# Patient Record
Sex: Male | Born: 1935 | Race: White | Hispanic: No | Marital: Married | Smoking: Former smoker
Health system: Southern US, Community
[De-identification: ages and names within clinical notes are randomized; demographics above are authoritative.]

## PROBLEM LIST (undated history)

## (undated) DIAGNOSIS — K219 Gastro-esophageal reflux disease without esophagitis: Secondary | ICD-10-CM

## (undated) DIAGNOSIS — C4490 Unspecified malignant neoplasm of skin, unspecified: Secondary | ICD-10-CM

## (undated) DIAGNOSIS — I255 Ischemic cardiomyopathy: Secondary | ICD-10-CM

## (undated) DIAGNOSIS — N138 Other obstructive and reflux uropathy: Secondary | ICD-10-CM

## (undated) DIAGNOSIS — N401 Enlarged prostate with lower urinary tract symptoms: Secondary | ICD-10-CM

## (undated) DIAGNOSIS — E7800 Pure hypercholesterolemia, unspecified: Secondary | ICD-10-CM

## (undated) DIAGNOSIS — I219 Acute myocardial infarction, unspecified: Secondary | ICD-10-CM

## (undated) DIAGNOSIS — I4891 Unspecified atrial fibrillation: Secondary | ICD-10-CM

## (undated) DIAGNOSIS — M1990 Unspecified osteoarthritis, unspecified site: Secondary | ICD-10-CM

## (undated) DIAGNOSIS — Z95810 Presence of automatic (implantable) cardiac defibrillator: Secondary | ICD-10-CM

## (undated) DIAGNOSIS — C61 Malignant neoplasm of prostate: Secondary | ICD-10-CM

## (undated) DIAGNOSIS — I739 Peripheral vascular disease, unspecified: Secondary | ICD-10-CM

## (undated) DIAGNOSIS — E039 Hypothyroidism, unspecified: Secondary | ICD-10-CM

## (undated) DIAGNOSIS — R413 Other amnesia: Secondary | ICD-10-CM

## (undated) DIAGNOSIS — N184 Chronic kidney disease, stage 4 (severe): Secondary | ICD-10-CM

## (undated) DIAGNOSIS — Z79899 Other long term (current) drug therapy: Secondary | ICD-10-CM

## (undated) DIAGNOSIS — K559 Vascular disorder of intestine, unspecified: Secondary | ICD-10-CM

## (undated) DIAGNOSIS — I10 Essential (primary) hypertension: Secondary | ICD-10-CM

## (undated) DIAGNOSIS — F419 Anxiety disorder, unspecified: Secondary | ICD-10-CM

## (undated) DIAGNOSIS — I4892 Unspecified atrial flutter: Secondary | ICD-10-CM

## (undated) DIAGNOSIS — D649 Anemia, unspecified: Secondary | ICD-10-CM

## (undated) DIAGNOSIS — I2510 Atherosclerotic heart disease of native coronary artery without angina pectoris: Secondary | ICD-10-CM

## (undated) DIAGNOSIS — F068 Other specified mental disorders due to known physiological condition: Secondary | ICD-10-CM

## (undated) DIAGNOSIS — I509 Heart failure, unspecified: Secondary | ICD-10-CM

## (undated) DIAGNOSIS — I5022 Chronic systolic (congestive) heart failure: Secondary | ICD-10-CM

## (undated) HISTORY — DX: Unspecified atrial flutter: I48.92

## (undated) HISTORY — DX: Anemia, unspecified: D64.9

## (undated) HISTORY — DX: Essential (primary) hypertension: I10

## (undated) HISTORY — DX: Ischemic cardiomyopathy: I25.5

## (undated) HISTORY — DX: Benign prostatic hyperplasia with lower urinary tract symptoms: N40.1

## (undated) HISTORY — DX: Other amnesia: R41.3

## (undated) HISTORY — DX: Chronic systolic (congestive) heart failure: I50.22

## (undated) HISTORY — DX: Unspecified osteoarthritis, unspecified site: M19.90

## (undated) HISTORY — DX: Other long term (current) drug therapy: Z79.899

## (undated) HISTORY — PX: CARDIAC DEFIBRILLATOR PLACEMENT: SHX171

## (undated) HISTORY — DX: Peripheral vascular disease, unspecified: I73.9

## (undated) HISTORY — DX: Other obstructive and reflux uropathy: N13.8

## (undated) HISTORY — DX: Unspecified atrial fibrillation: I48.91

## (undated) HISTORY — DX: Atherosclerotic heart disease of native coronary artery without angina pectoris: I25.10

## (undated) HISTORY — PX: COLON SURGERY: SHX602

## (undated) HISTORY — PX: CARDIAC CATHETERIZATION: SHX172

## (undated) HISTORY — PX: SKIN CANCER EXCISION: SHX779

## (undated) HISTORY — DX: Pure hypercholesterolemia, unspecified: E78.00

## (undated) HISTORY — PX: DOPPLER ECHOCARDIOGRAPHY: SHX263

## (undated) HISTORY — DX: Hypothyroidism, unspecified: E03.9

## (undated) HISTORY — PX: INCISION AND DRAINAGE: SHX5863

## (undated) HISTORY — DX: Vascular disorder of intestine, unspecified: K55.9

## (undated) HISTORY — DX: Other specified mental disorders due to known physiological condition: F06.8

## (undated) HISTORY — DX: Anxiety disorder, unspecified: F41.9

---

## 1992-02-06 ENCOUNTER — Encounter: Payer: Self-pay | Admitting: Gastroenterology

## 1995-04-12 ENCOUNTER — Encounter: Payer: Self-pay | Admitting: Gastroenterology

## 1996-08-07 DIAGNOSIS — I219 Acute myocardial infarction, unspecified: Secondary | ICD-10-CM

## 1996-08-07 HISTORY — PX: PERMANENT ILEOSTOMY: SHX6021

## 1996-08-07 HISTORY — DX: Acute myocardial infarction, unspecified: I21.9

## 1996-08-07 HISTORY — PX: TOTAL COLECTOMY: SHX852

## 1996-08-07 HISTORY — PX: CORONARY ARTERY BYPASS GRAFT: SHX141

## 1997-09-07 ENCOUNTER — Encounter: Payer: Self-pay | Admitting: Internal Medicine

## 1999-04-08 HISTORY — PX: CATARACT EXTRACTION W/ INTRAOCULAR LENS  IMPLANT, BILATERAL: SHX1307

## 1999-10-26 ENCOUNTER — Encounter: Payer: Self-pay | Admitting: *Deleted

## 1999-10-26 ENCOUNTER — Inpatient Hospital Stay (HOSPITAL_COMMUNITY): Admission: EM | Admit: 1999-10-26 | Discharge: 1999-11-01 | Payer: Self-pay | Admitting: *Deleted

## 1999-10-27 ENCOUNTER — Encounter: Payer: Self-pay | Admitting: *Deleted

## 1999-10-28 ENCOUNTER — Encounter: Payer: Self-pay | Admitting: Nephrology

## 2000-07-06 ENCOUNTER — Emergency Department (HOSPITAL_COMMUNITY): Admission: EM | Admit: 2000-07-06 | Discharge: 2000-07-06 | Payer: Self-pay | Admitting: Emergency Medicine

## 2000-07-10 ENCOUNTER — Encounter: Payer: Self-pay | Admitting: Emergency Medicine

## 2000-07-10 ENCOUNTER — Emergency Department (HOSPITAL_COMMUNITY): Admission: EM | Admit: 2000-07-10 | Discharge: 2000-07-10 | Payer: Self-pay | Admitting: Emergency Medicine

## 2001-05-21 ENCOUNTER — Other Ambulatory Visit: Admission: RE | Admit: 2001-05-21 | Discharge: 2001-05-21 | Payer: Self-pay | Admitting: Urology

## 2001-06-13 ENCOUNTER — Encounter: Payer: Self-pay | Admitting: Urology

## 2001-06-13 ENCOUNTER — Encounter: Admission: RE | Admit: 2001-06-13 | Discharge: 2001-06-13 | Payer: Self-pay | Admitting: Urology

## 2001-06-20 ENCOUNTER — Ambulatory Visit: Admission: RE | Admit: 2001-06-20 | Discharge: 2001-09-18 | Payer: Self-pay | Admitting: Radiation Oncology

## 2001-08-07 DIAGNOSIS — C61 Malignant neoplasm of prostate: Secondary | ICD-10-CM

## 2001-08-07 HISTORY — DX: Malignant neoplasm of prostate: C61

## 2001-09-04 ENCOUNTER — Emergency Department (HOSPITAL_COMMUNITY): Admission: EM | Admit: 2001-09-04 | Discharge: 2001-09-04 | Payer: Self-pay | Admitting: Emergency Medicine

## 2001-09-04 ENCOUNTER — Encounter: Payer: Self-pay | Admitting: Emergency Medicine

## 2001-09-19 ENCOUNTER — Ambulatory Visit: Admission: RE | Admit: 2001-09-19 | Discharge: 2001-12-18 | Payer: Self-pay | Admitting: Radiation Oncology

## 2001-11-07 ENCOUNTER — Emergency Department (HOSPITAL_COMMUNITY): Admission: EM | Admit: 2001-11-07 | Discharge: 2001-11-07 | Payer: Self-pay | Admitting: Emergency Medicine

## 2002-07-22 ENCOUNTER — Encounter: Payer: Self-pay | Admitting: Internal Medicine

## 2002-07-22 ENCOUNTER — Ambulatory Visit (HOSPITAL_COMMUNITY): Admission: RE | Admit: 2002-07-22 | Discharge: 2002-07-23 | Payer: Self-pay | Admitting: Internal Medicine

## 2002-07-23 ENCOUNTER — Encounter: Payer: Self-pay | Admitting: Internal Medicine

## 2003-12-22 ENCOUNTER — Ambulatory Visit (HOSPITAL_COMMUNITY): Admission: RE | Admit: 2003-12-22 | Discharge: 2003-12-22 | Payer: Self-pay | Admitting: Pulmonary Disease

## 2004-04-14 ENCOUNTER — Inpatient Hospital Stay (HOSPITAL_COMMUNITY): Admission: AD | Admit: 2004-04-14 | Discharge: 2004-04-18 | Payer: Self-pay | Admitting: Pulmonary Disease

## 2004-05-04 ENCOUNTER — Encounter: Admission: RE | Admit: 2004-05-04 | Discharge: 2004-05-04 | Payer: Self-pay | Admitting: Nephrology

## 2004-06-16 ENCOUNTER — Ambulatory Visit: Payer: Self-pay | Admitting: Pulmonary Disease

## 2004-09-14 ENCOUNTER — Ambulatory Visit: Payer: Self-pay | Admitting: Pulmonary Disease

## 2005-01-03 ENCOUNTER — Ambulatory Visit: Payer: Self-pay | Admitting: Internal Medicine

## 2005-01-03 ENCOUNTER — Ambulatory Visit: Payer: Self-pay | Admitting: *Deleted

## 2005-01-12 ENCOUNTER — Ambulatory Visit: Payer: Self-pay | Admitting: Pulmonary Disease

## 2005-04-18 ENCOUNTER — Ambulatory Visit: Payer: Self-pay | Admitting: Internal Medicine

## 2005-06-22 ENCOUNTER — Ambulatory Visit: Payer: Self-pay | Admitting: *Deleted

## 2005-07-13 ENCOUNTER — Encounter: Payer: Self-pay | Admitting: Internal Medicine

## 2005-07-13 ENCOUNTER — Ambulatory Visit: Payer: Self-pay

## 2005-07-13 ENCOUNTER — Ambulatory Visit: Payer: Self-pay | Admitting: Pulmonary Disease

## 2005-08-10 ENCOUNTER — Ambulatory Visit: Payer: Self-pay

## 2005-09-08 ENCOUNTER — Ambulatory Visit: Payer: Self-pay | Admitting: Pulmonary Disease

## 2005-09-08 ENCOUNTER — Ambulatory Visit: Payer: Self-pay | Admitting: Infectious Diseases

## 2005-09-08 ENCOUNTER — Inpatient Hospital Stay (HOSPITAL_COMMUNITY): Admission: EM | Admit: 2005-09-08 | Discharge: 2005-09-14 | Payer: Self-pay | Admitting: Pulmonary Disease

## 2005-09-19 ENCOUNTER — Ambulatory Visit: Payer: Self-pay | Admitting: Pulmonary Disease

## 2005-10-16 ENCOUNTER — Ambulatory Visit: Payer: Self-pay | Admitting: Pulmonary Disease

## 2005-10-19 ENCOUNTER — Ambulatory Visit: Payer: Self-pay | Admitting: *Deleted

## 2005-10-28 ENCOUNTER — Inpatient Hospital Stay (HOSPITAL_COMMUNITY): Admission: EM | Admit: 2005-10-28 | Discharge: 2005-11-05 | Payer: Self-pay | Admitting: Emergency Medicine

## 2005-10-28 ENCOUNTER — Ambulatory Visit: Payer: Self-pay | Admitting: Critical Care Medicine

## 2005-10-28 ENCOUNTER — Ambulatory Visit: Payer: Self-pay | Admitting: Cardiology

## 2005-10-30 ENCOUNTER — Encounter: Payer: Self-pay | Admitting: Cardiology

## 2005-11-21 ENCOUNTER — Ambulatory Visit: Payer: Self-pay | Admitting: Internal Medicine

## 2006-01-17 ENCOUNTER — Ambulatory Visit: Payer: Self-pay | Admitting: *Deleted

## 2006-02-22 ENCOUNTER — Ambulatory Visit: Payer: Self-pay | Admitting: Internal Medicine

## 2006-03-20 ENCOUNTER — Ambulatory Visit: Payer: Self-pay | Admitting: Pulmonary Disease

## 2006-03-20 ENCOUNTER — Ambulatory Visit: Payer: Self-pay | Admitting: *Deleted

## 2006-04-04 ENCOUNTER — Encounter: Payer: Self-pay | Admitting: Cardiology

## 2006-04-04 ENCOUNTER — Ambulatory Visit: Payer: Self-pay

## 2006-05-24 ENCOUNTER — Ambulatory Visit: Payer: Self-pay | Admitting: Internal Medicine

## 2006-07-03 ENCOUNTER — Ambulatory Visit: Payer: Self-pay | Admitting: *Deleted

## 2006-07-19 ENCOUNTER — Ambulatory Visit: Payer: Self-pay | Admitting: Pulmonary Disease

## 2006-08-23 ENCOUNTER — Ambulatory Visit: Payer: Self-pay | Admitting: Internal Medicine

## 2006-09-13 ENCOUNTER — Ambulatory Visit: Payer: Self-pay | Admitting: *Deleted

## 2006-09-13 ENCOUNTER — Ambulatory Visit: Payer: Self-pay

## 2006-09-13 LAB — CONVERTED CEMR LAB
BUN: 34 mg/dL — ABNORMAL HIGH (ref 6–23)
CO2: 29 meq/L (ref 19–32)
Calcium: 8.8 mg/dL (ref 8.4–10.5)
Chloride: 107 meq/L (ref 96–112)
Creatinine, Ser: 1.9 mg/dL — ABNORMAL HIGH (ref 0.4–1.5)
GFR calc Af Amer: 44 mL/min
GFR calc non Af Amer: 37 mL/min
Glucose, Bld: 64 mg/dL — ABNORMAL LOW (ref 70–99)
Potassium: 4.2 meq/L (ref 3.5–5.1)
Sodium: 139 meq/L (ref 135–145)

## 2006-10-17 ENCOUNTER — Ambulatory Visit: Payer: Self-pay | Admitting: Cardiology

## 2006-11-27 ENCOUNTER — Ambulatory Visit: Payer: Self-pay | Admitting: *Deleted

## 2006-11-27 ENCOUNTER — Ambulatory Visit: Payer: Self-pay | Admitting: Cardiology

## 2006-11-27 ENCOUNTER — Ambulatory Visit: Payer: Self-pay | Admitting: Pulmonary Disease

## 2006-11-27 LAB — CONVERTED CEMR LAB
BUN: 46 mg/dL — ABNORMAL HIGH (ref 6–23)
Calcium: 8.9 mg/dL (ref 8.4–10.5)
GFR calc Af Amer: 35 mL/min
Potassium: 5.3 meq/L — ABNORMAL HIGH (ref 3.5–5.1)

## 2006-12-13 ENCOUNTER — Ambulatory Visit: Payer: Self-pay | Admitting: Internal Medicine

## 2007-02-25 ENCOUNTER — Ambulatory Visit: Payer: Self-pay | Admitting: Cardiology

## 2007-02-25 ENCOUNTER — Ambulatory Visit: Payer: Self-pay | Admitting: Internal Medicine

## 2007-03-12 ENCOUNTER — Ambulatory Visit: Payer: Self-pay | Admitting: Pulmonary Disease

## 2007-03-12 ENCOUNTER — Inpatient Hospital Stay (HOSPITAL_COMMUNITY): Admission: AD | Admit: 2007-03-12 | Discharge: 2007-03-13 | Payer: Self-pay | Admitting: Internal Medicine

## 2007-03-12 ENCOUNTER — Ambulatory Visit: Payer: Self-pay | Admitting: Cardiology

## 2007-05-15 ENCOUNTER — Ambulatory Visit: Payer: Self-pay | Admitting: Internal Medicine

## 2007-06-01 DIAGNOSIS — E039 Hypothyroidism, unspecified: Secondary | ICD-10-CM | POA: Insufficient documentation

## 2007-06-01 DIAGNOSIS — M159 Polyosteoarthritis, unspecified: Secondary | ICD-10-CM

## 2007-06-01 DIAGNOSIS — I10 Essential (primary) hypertension: Secondary | ICD-10-CM | POA: Insufficient documentation

## 2007-06-01 DIAGNOSIS — E7800 Pure hypercholesterolemia, unspecified: Secondary | ICD-10-CM

## 2007-06-01 DIAGNOSIS — I739 Peripheral vascular disease, unspecified: Secondary | ICD-10-CM

## 2007-06-04 ENCOUNTER — Ambulatory Visit: Payer: Self-pay | Admitting: Pulmonary Disease

## 2007-07-16 ENCOUNTER — Ambulatory Visit: Payer: Self-pay

## 2007-08-15 ENCOUNTER — Ambulatory Visit: Payer: Self-pay | Admitting: Internal Medicine

## 2007-10-03 ENCOUNTER — Ambulatory Visit: Payer: Self-pay | Admitting: Pulmonary Disease

## 2007-10-03 DIAGNOSIS — F068 Other specified mental disorders due to known physiological condition: Secondary | ICD-10-CM

## 2007-10-03 DIAGNOSIS — N401 Enlarged prostate with lower urinary tract symptoms: Secondary | ICD-10-CM

## 2007-10-03 DIAGNOSIS — N189 Chronic kidney disease, unspecified: Secondary | ICD-10-CM

## 2007-10-03 DIAGNOSIS — I2510 Atherosclerotic heart disease of native coronary artery without angina pectoris: Secondary | ICD-10-CM | POA: Insufficient documentation

## 2007-10-03 DIAGNOSIS — I4892 Unspecified atrial flutter: Secondary | ICD-10-CM

## 2007-10-03 DIAGNOSIS — F411 Generalized anxiety disorder: Secondary | ICD-10-CM | POA: Insufficient documentation

## 2007-10-03 DIAGNOSIS — I2589 Other forms of chronic ischemic heart disease: Secondary | ICD-10-CM

## 2007-10-03 DIAGNOSIS — K559 Vascular disorder of intestine, unspecified: Secondary | ICD-10-CM | POA: Insufficient documentation

## 2007-10-03 LAB — CONVERTED CEMR LAB
Albumin: 3.7 g/dL (ref 3.5–5.2)
Basophils Absolute: 0.1 10*3/uL (ref 0.0–0.1)
Cholesterol: 189 mg/dL (ref 0–200)
Creatinine, Ser: 2 mg/dL — ABNORMAL HIGH (ref 0.4–1.5)
Direct LDL: 104.1 mg/dL
Eosinophils Absolute: 0.2 10*3/uL (ref 0.0–0.6)
GFR calc Af Amer: 42 mL/min
HCT: 44.4 % (ref 39.0–52.0)
HDL: 52.6 mg/dL (ref >39.0)
Hemoglobin: 14.8 g/dL (ref 13.0–17.0)
Lymphocytes Relative: 29.2 % (ref 12.0–46.0)
MCHC: 33.4 g/dL (ref 30.0–36.0)
MCV: 99.2 fL (ref 78.0–100.0)
Monocytes Absolute: 0.8 10*3/uL — ABNORMAL HIGH (ref 0.2–0.7)
Neutro Abs: 4.6 10*3/uL (ref 1.4–7.7)
Neutrophils Relative %: 56.9 % (ref 43.0–77.0)
PSA: 1.87 ng/mL (ref 0.10–4.00)
Potassium: 4.4 meq/L (ref 3.5–5.1)
RDW: 13.7 % (ref 11.5–14.6)
Sodium: 140 meq/L (ref 135–145)
Total Bilirubin: 0.9 mg/dL (ref 0.3–1.2)

## 2007-10-14 ENCOUNTER — Encounter: Payer: Self-pay | Admitting: Pulmonary Disease

## 2007-11-14 ENCOUNTER — Ambulatory Visit: Payer: Self-pay | Admitting: Internal Medicine

## 2008-01-21 ENCOUNTER — Ambulatory Visit: Payer: Self-pay | Admitting: Pulmonary Disease

## 2008-01-21 DIAGNOSIS — R05 Cough: Secondary | ICD-10-CM

## 2008-01-21 DIAGNOSIS — R059 Cough, unspecified: Secondary | ICD-10-CM | POA: Insufficient documentation

## 2008-02-13 ENCOUNTER — Ambulatory Visit: Payer: Self-pay | Admitting: Cardiology

## 2008-02-13 ENCOUNTER — Ambulatory Visit: Payer: Self-pay | Admitting: Internal Medicine

## 2008-02-24 ENCOUNTER — Encounter: Payer: Self-pay | Admitting: Pulmonary Disease

## 2008-03-04 ENCOUNTER — Encounter: Payer: Self-pay | Admitting: Pulmonary Disease

## 2008-05-21 ENCOUNTER — Ambulatory Visit: Payer: Self-pay | Admitting: Internal Medicine

## 2008-06-15 ENCOUNTER — Encounter: Payer: Self-pay | Admitting: Pulmonary Disease

## 2008-06-29 IMAGING — CR DG RIBS W/ CHEST 3+V*L*
3 series · 3 of 3 positions shown · non-contrast
Comparison: none

CLINICAL DATA: Left-sided rib pain.
 CHEST AND LEFT RIBS ? 3 VIEW:

[w chest pa]
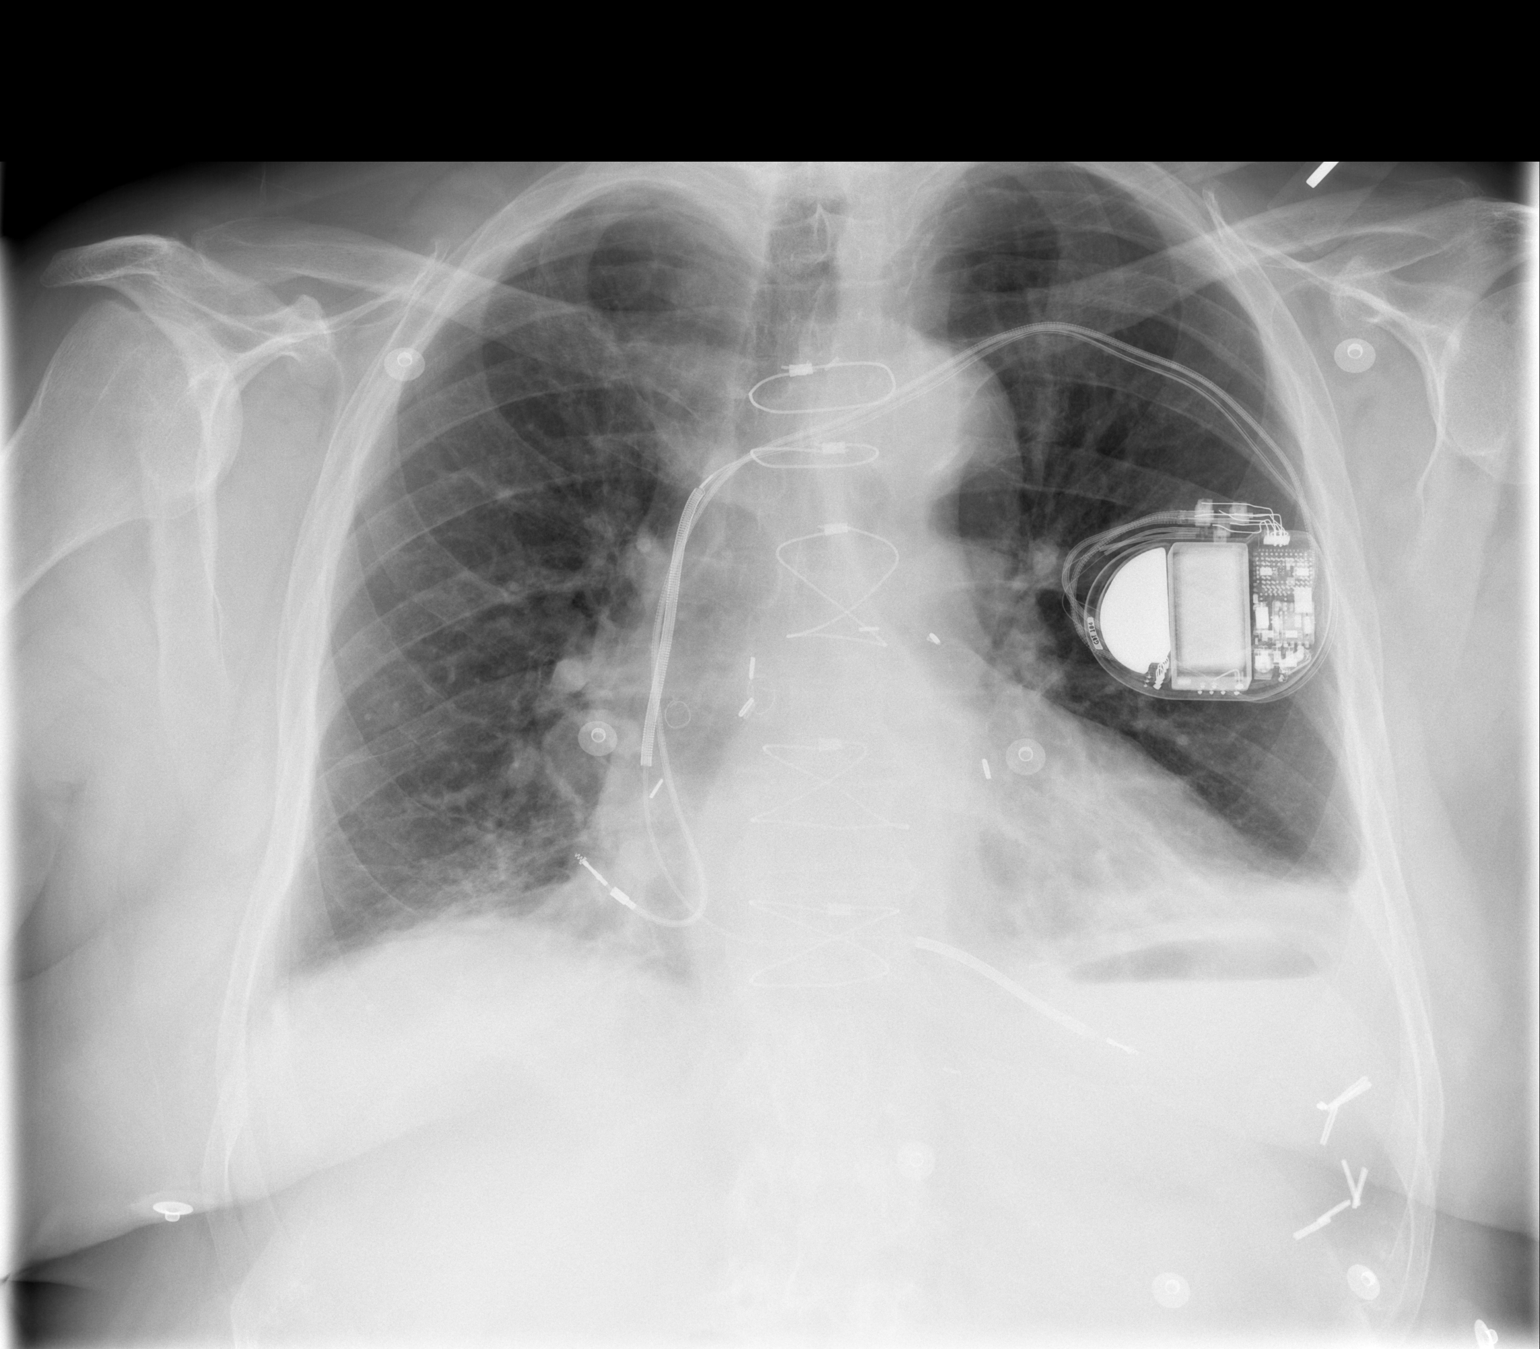

[w ribs ap/pa upper left *]
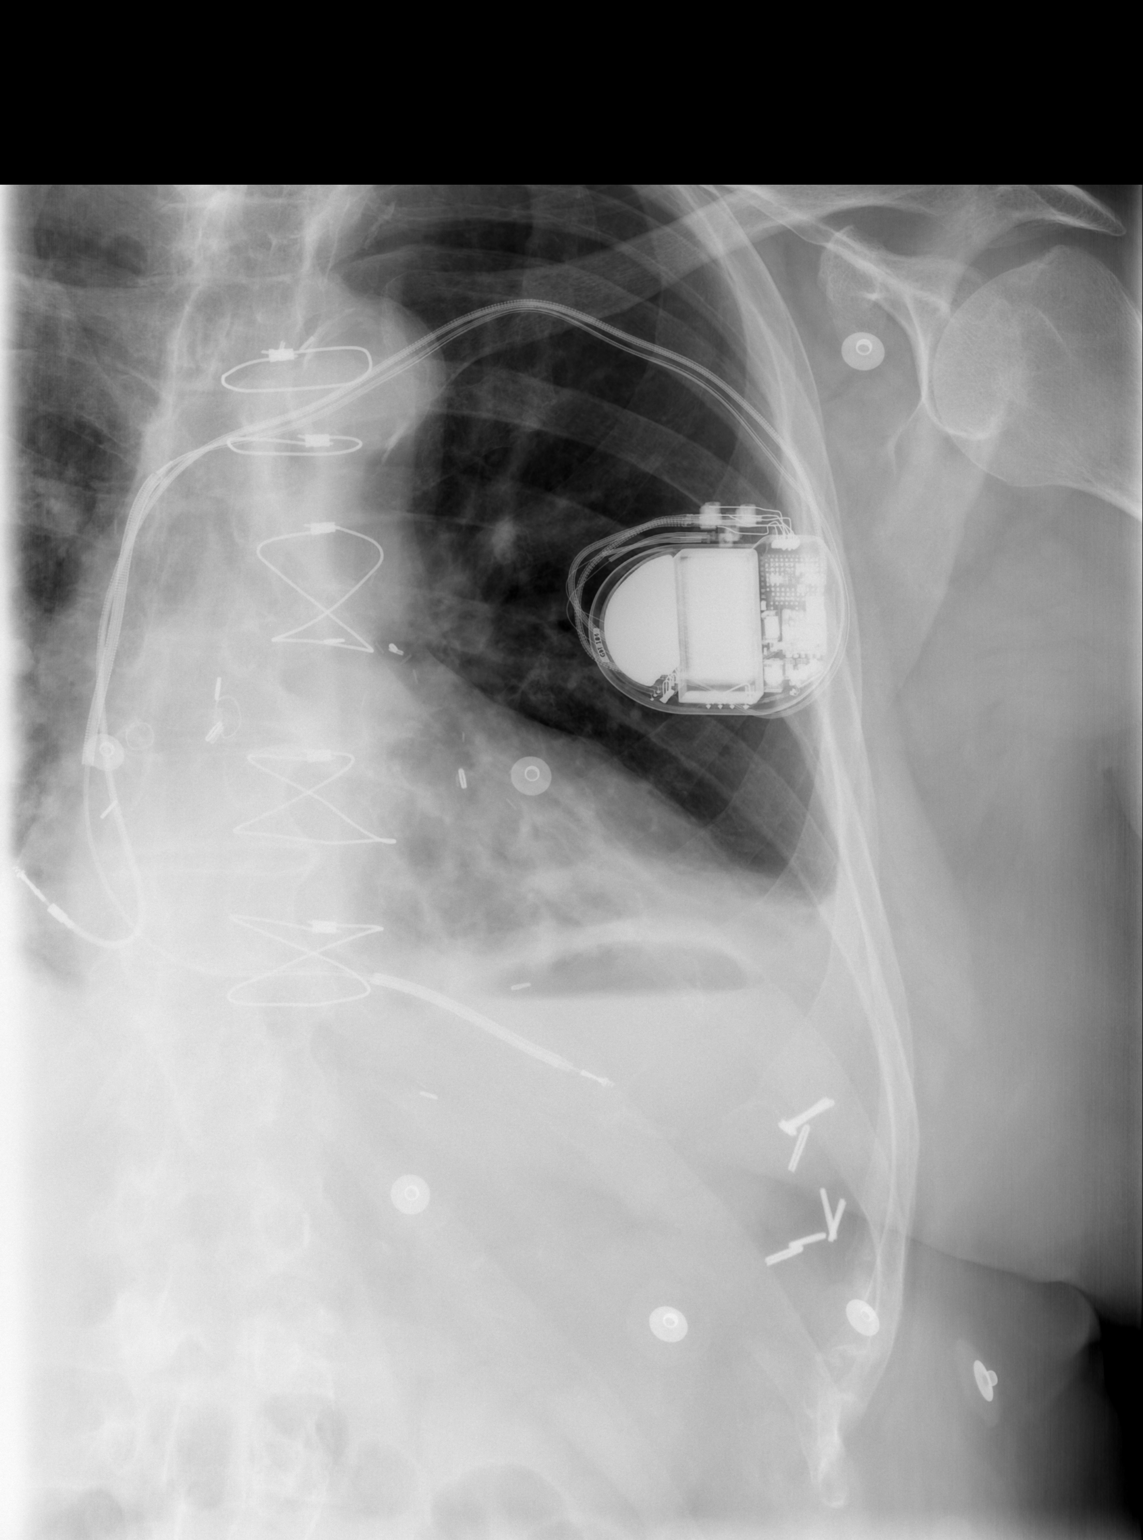

[w ribs ap/pa lower left *]
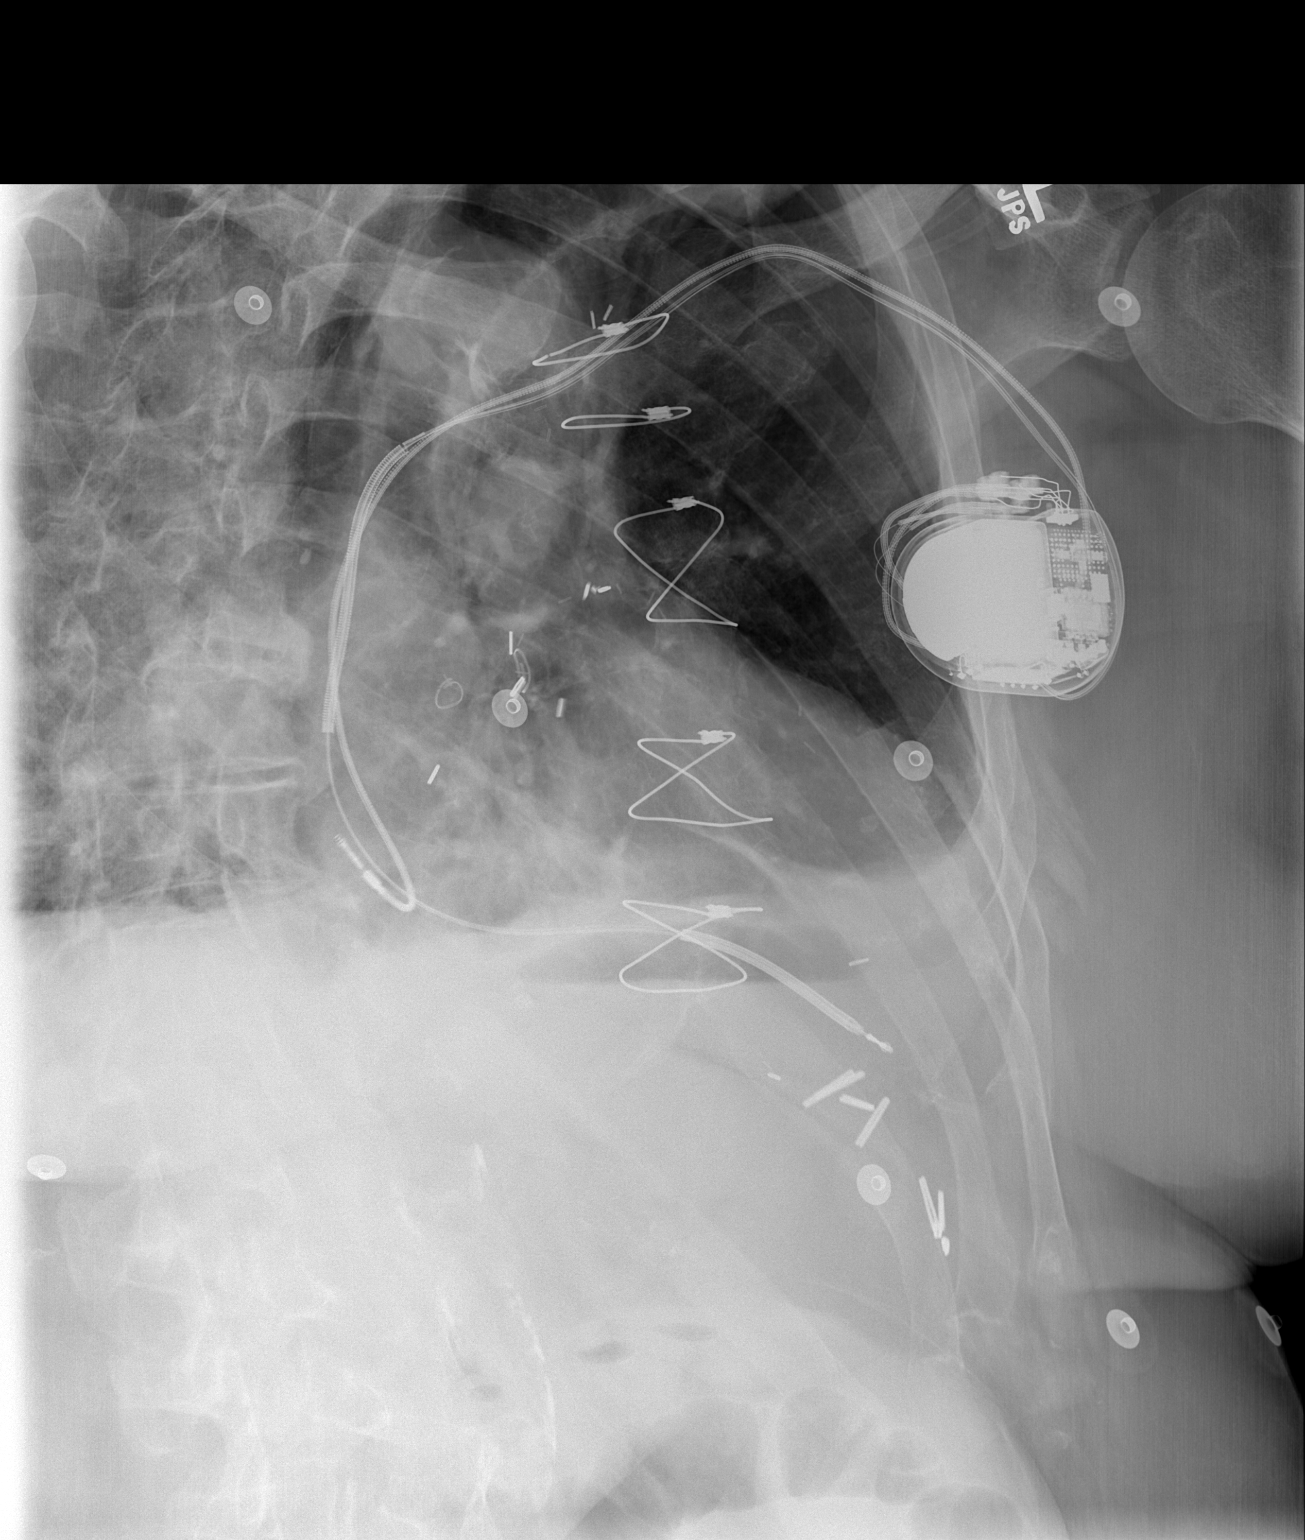

[3 of 3 positions shown; findings below may reference images not displayed]

FINDINGS: The patient has an AICD which is grossly well positioned.  The heart is enlarged.  There is abnormal density in both lower lungs.  This could be scarring or atelectasis.  The upper lobes are clear.  Left-sided rib films do not show any fractures or focal lesions.
IMPRESSION: 1.  Negative evaluation of the ribs. 
 2.  Cardiomegaly. 
 3.  Bibasilar lung density that could be scarring or atelectasis.

## 2008-07-16 ENCOUNTER — Encounter: Payer: Self-pay | Admitting: Pulmonary Disease

## 2008-07-23 ENCOUNTER — Ambulatory Visit: Payer: Self-pay | Admitting: Pulmonary Disease

## 2008-08-13 ENCOUNTER — Ambulatory Visit: Payer: Self-pay | Admitting: Internal Medicine

## 2008-09-18 ENCOUNTER — Telehealth: Payer: Self-pay | Admitting: Pulmonary Disease

## 2008-09-18 DIAGNOSIS — R498 Other voice and resonance disorders: Secondary | ICD-10-CM | POA: Insufficient documentation

## 2008-10-07 DIAGNOSIS — D649 Anemia, unspecified: Secondary | ICD-10-CM

## 2008-10-08 ENCOUNTER — Ambulatory Visit: Payer: Self-pay | Admitting: Gastroenterology

## 2008-10-08 ENCOUNTER — Telehealth (INDEPENDENT_AMBULATORY_CARE_PROVIDER_SITE_OTHER): Payer: Self-pay | Admitting: *Deleted

## 2008-10-08 DIAGNOSIS — R6889 Other general symptoms and signs: Secondary | ICD-10-CM

## 2008-10-09 ENCOUNTER — Encounter: Payer: Self-pay | Admitting: Pulmonary Disease

## 2008-10-22 ENCOUNTER — Encounter: Payer: Self-pay | Admitting: Internal Medicine

## 2008-11-11 ENCOUNTER — Telehealth (INDEPENDENT_AMBULATORY_CARE_PROVIDER_SITE_OTHER): Payer: Self-pay | Admitting: *Deleted

## 2008-11-12 ENCOUNTER — Ambulatory Visit: Payer: Self-pay | Admitting: Internal Medicine

## 2008-11-16 ENCOUNTER — Encounter: Payer: Self-pay | Admitting: Pulmonary Disease

## 2008-12-02 ENCOUNTER — Encounter: Payer: Self-pay | Admitting: Internal Medicine

## 2008-12-07 LAB — CONVERTED CEMR LAB: INR: 1.8 — ABNORMAL HIGH (ref 0.0–1.5)

## 2008-12-17 ENCOUNTER — Encounter: Payer: Self-pay | Admitting: Internal Medicine

## 2008-12-29 ENCOUNTER — Encounter: Payer: Self-pay | Admitting: Internal Medicine

## 2008-12-30 LAB — CONVERTED CEMR LAB: INR: 1.8

## 2008-12-31 LAB — CONVERTED CEMR LAB: Prothrombin Time: 21.5 s — ABNORMAL HIGH (ref 11.6–15.2)

## 2009-01-01 ENCOUNTER — Encounter: Payer: Self-pay | Admitting: Internal Medicine

## 2009-01-05 ENCOUNTER — Encounter: Payer: Self-pay | Admitting: *Deleted

## 2009-01-13 ENCOUNTER — Encounter: Payer: Self-pay | Admitting: Internal Medicine

## 2009-01-15 ENCOUNTER — Encounter: Payer: Self-pay | Admitting: Cardiovascular Disease

## 2009-01-21 ENCOUNTER — Ambulatory Visit: Payer: Self-pay | Admitting: Pulmonary Disease

## 2009-01-21 DIAGNOSIS — C61 Malignant neoplasm of prostate: Secondary | ICD-10-CM

## 2009-01-25 LAB — CONVERTED CEMR LAB
ALT: 23 units/L (ref 0–53)
Basophils Relative: 1.5 % (ref 0.0–3.0)
Bilirubin, Direct: 0.2 mg/dL (ref 0.0–0.3)
Chloride: 100 meq/L (ref 96–112)
Cholesterol: 180 mg/dL (ref 0–200)
Eosinophils Relative: 3.6 % (ref 0.0–5.0)
HCT: 42.2 % (ref 39.0–52.0)
Hemoglobin: 14.5 g/dL (ref 13.0–17.0)
Iron: 75 ug/dL (ref 42–165)
LDL Cholesterol: 92 mg/dL (ref 0–99)
Lymphs Abs: 2.4 10*3/uL (ref 0.7–4.0)
MCV: 97.3 fL (ref 78.0–100.0)
Monocytes Absolute: 0.8 10*3/uL (ref 0.1–1.0)
Potassium: 3.7 meq/L (ref 3.5–5.1)
RBC: 4.34 M/uL (ref 4.22–5.81)
TSH: 1.18 microintl units/mL (ref 0.35–5.50)
Total CHOL/HDL Ratio: 3
Total Protein: 8.1 g/dL (ref 6.0–8.3)
Vit D, 25-Hydroxy: 24 ng/mL — ABNORMAL LOW (ref 30–89)
Vitamin B-12: 489 pg/mL (ref 211–911)
WBC: 6.2 10*3/uL (ref 4.5–10.5)

## 2009-01-26 ENCOUNTER — Encounter: Payer: Self-pay | Admitting: Internal Medicine

## 2009-01-27 ENCOUNTER — Encounter: Payer: Self-pay | Admitting: Internal Medicine

## 2009-01-27 LAB — CONVERTED CEMR LAB
INR: 3.9 — ABNORMAL HIGH (ref 0.0–1.5)
Prothrombin Time: 41.4 s — ABNORMAL HIGH (ref 11.6–15.2)

## 2009-02-09 ENCOUNTER — Encounter: Payer: Self-pay | Admitting: Internal Medicine

## 2009-02-10 ENCOUNTER — Encounter: Payer: Self-pay | Admitting: Internal Medicine

## 2009-02-10 ENCOUNTER — Encounter: Payer: Self-pay | Admitting: *Deleted

## 2009-02-10 LAB — CONVERTED CEMR LAB: POC INR: 3.2

## 2009-02-16 ENCOUNTER — Ambulatory Visit: Payer: Self-pay | Admitting: Internal Medicine

## 2009-02-16 DIAGNOSIS — I4891 Unspecified atrial fibrillation: Secondary | ICD-10-CM

## 2009-02-16 DIAGNOSIS — Z95810 Presence of automatic (implantable) cardiac defibrillator: Secondary | ICD-10-CM | POA: Insufficient documentation

## 2009-02-23 ENCOUNTER — Encounter (INDEPENDENT_AMBULATORY_CARE_PROVIDER_SITE_OTHER): Payer: Self-pay | Admitting: Cardiology

## 2009-02-23 ENCOUNTER — Encounter: Payer: Self-pay | Admitting: Internal Medicine

## 2009-02-23 LAB — CONVERTED CEMR LAB: INR: 1.9 — ABNORMAL HIGH (ref 0.0–1.5)

## 2009-02-24 ENCOUNTER — Encounter: Payer: Self-pay | Admitting: Pulmonary Disease

## 2009-02-25 ENCOUNTER — Encounter: Payer: Self-pay | Admitting: Cardiovascular Disease

## 2009-02-25 LAB — CONVERTED CEMR LAB
POC INR: 1.9
Prothrombin Time: 23 s

## 2009-03-10 ENCOUNTER — Encounter: Payer: Self-pay | Admitting: Internal Medicine

## 2009-03-12 ENCOUNTER — Encounter: Payer: Self-pay | Admitting: Cardiology

## 2009-03-12 LAB — CONVERTED CEMR LAB
POC INR: 1.8
Prothrombin Time: 21.9 s

## 2009-03-24 ENCOUNTER — Encounter: Payer: Self-pay | Admitting: Internal Medicine

## 2009-03-24 ENCOUNTER — Encounter: Payer: Self-pay | Admitting: Cardiology

## 2009-03-24 LAB — CONVERTED CEMR LAB: INR: 1.9 — ABNORMAL HIGH (ref 0.0–1.5)

## 2009-03-25 ENCOUNTER — Encounter: Payer: Self-pay | Admitting: Cardiology

## 2009-03-25 LAB — CONVERTED CEMR LAB
POC INR: 1.9
Prothrombin Time: 21.8 s

## 2009-04-02 ENCOUNTER — Telehealth: Payer: Self-pay | Admitting: Internal Medicine

## 2009-04-08 ENCOUNTER — Encounter: Payer: Self-pay | Admitting: Internal Medicine

## 2009-04-08 LAB — CONVERTED CEMR LAB: INR: 2.6 — ABNORMAL HIGH (ref 0.0–1.5)

## 2009-04-09 ENCOUNTER — Encounter: Payer: Self-pay | Admitting: Internal Medicine

## 2009-04-13 ENCOUNTER — Encounter: Payer: Self-pay | Admitting: Pulmonary Disease

## 2009-04-19 ENCOUNTER — Encounter: Payer: Self-pay | Admitting: Pulmonary Disease

## 2009-04-22 ENCOUNTER — Encounter: Payer: Self-pay | Admitting: Pulmonary Disease

## 2009-04-22 ENCOUNTER — Encounter: Payer: Self-pay | Admitting: Cardiology

## 2009-04-22 LAB — CONVERTED CEMR LAB
POC INR: 2.3
Prothrombin Time: 25 s

## 2009-05-03 ENCOUNTER — Encounter: Payer: Self-pay | Admitting: Cardiology

## 2009-05-14 ENCOUNTER — Encounter: Payer: Self-pay | Admitting: Pulmonary Disease

## 2009-05-17 ENCOUNTER — Encounter: Payer: Self-pay | Admitting: Internal Medicine

## 2009-05-17 LAB — CONVERTED CEMR LAB
POC INR: 2.2
Prothrombin Time: 24 s

## 2009-05-27 ENCOUNTER — Ambulatory Visit: Payer: Self-pay | Admitting: Internal Medicine

## 2009-06-03 ENCOUNTER — Encounter: Payer: Self-pay | Admitting: Internal Medicine

## 2009-06-06 LAB — CONVERTED CEMR LAB: INR: 2.2 — ABNORMAL HIGH (ref 0.0–1.5)

## 2009-06-15 ENCOUNTER — Encounter: Payer: Self-pay | Admitting: Cardiology

## 2009-06-15 ENCOUNTER — Encounter: Payer: Self-pay | Admitting: Internal Medicine

## 2009-06-15 LAB — CONVERTED CEMR LAB
BUN: 36 mg/dL — ABNORMAL HIGH (ref 6–23)
CO2: 28 meq/L (ref 19–32)
Calcium: 9.5 mg/dL (ref 8.4–10.5)
Creatinine, Ser: 2.03 mg/dL — ABNORMAL HIGH (ref 0.40–1.50)
Glucose, Bld: 90 mg/dL (ref 70–99)
POC INR: 2.79

## 2009-06-16 ENCOUNTER — Encounter: Payer: Self-pay | Admitting: Cardiology

## 2009-06-22 ENCOUNTER — Encounter: Payer: Self-pay | Admitting: Internal Medicine

## 2009-07-13 ENCOUNTER — Encounter: Payer: Self-pay | Admitting: Internal Medicine

## 2009-07-14 ENCOUNTER — Encounter: Payer: Self-pay | Admitting: Cardiology

## 2009-07-20 ENCOUNTER — Encounter: Payer: Self-pay | Admitting: Pulmonary Disease

## 2009-08-11 ENCOUNTER — Encounter: Payer: Self-pay | Admitting: Internal Medicine

## 2009-08-12 LAB — CONVERTED CEMR LAB
INR: 2.46 — ABNORMAL HIGH (ref <1.50)
Prothrombin Time: 26.5 s — ABNORMAL HIGH (ref 11.6–15.2)

## 2009-08-13 ENCOUNTER — Encounter: Payer: Self-pay | Admitting: Cardiology

## 2009-08-13 LAB — CONVERTED CEMR LAB
POC INR: 2.46
Prothrombin Time: 26.5 s

## 2009-09-07 ENCOUNTER — Encounter: Payer: Self-pay | Admitting: Internal Medicine

## 2009-09-13 LAB — CONVERTED CEMR LAB
BUN: 45 mg/dL — ABNORMAL HIGH (ref 6–23)
Calcium: 9.4 mg/dL (ref 8.4–10.5)
Chloride: 100 meq/L (ref 96–112)
Creatinine, Ser: 2.21 mg/dL — ABNORMAL HIGH (ref 0.40–1.50)
POC INR: 2.51
Prothrombin Time: 26.9 s
Prothrombin Time: 26.9 s — ABNORMAL HIGH (ref 11.6–15.2)

## 2009-09-16 ENCOUNTER — Encounter: Payer: Self-pay | Admitting: Internal Medicine

## 2009-09-28 ENCOUNTER — Ambulatory Visit: Payer: Self-pay | Admitting: Internal Medicine

## 2009-10-06 ENCOUNTER — Encounter: Payer: Self-pay | Admitting: Internal Medicine

## 2009-10-07 ENCOUNTER — Encounter: Payer: Self-pay | Admitting: Cardiology

## 2009-10-07 LAB — CONVERTED CEMR LAB: Prothrombin Time: 21.6 s

## 2009-10-14 ENCOUNTER — Ambulatory Visit: Payer: Self-pay | Admitting: Pulmonary Disease

## 2009-10-17 LAB — CONVERTED CEMR LAB
Albumin: 3.9 g/dL (ref 3.5–5.2)
Alkaline Phosphatase: 43 units/L (ref 39–117)
Calcium: 9.3 mg/dL (ref 8.4–10.5)
Eosinophils Relative: 3.3 % (ref 0.0–5.0)
GFR calc non Af Amer: 30.56 mL/min (ref >60)
Glucose, Bld: 92 mg/dL (ref 70–99)
HCT: 43.6 % (ref 39.0–52.0)
HDL: 61.3 mg/dL (ref >39.00)
Hemoglobin: 14.9 g/dL (ref 13.0–17.0)
LDL Cholesterol: 89 mg/dL (ref 0–99)
Lymphs Abs: 2 10*3/uL (ref 0.7–4.0)
Monocytes Relative: 10.1 % (ref 3.0–12.0)
Neutro Abs: 3.5 10*3/uL (ref 1.4–7.7)
RDW: 13.8 % (ref 11.5–14.6)
Sodium: 142 meq/L (ref 135–145)
VLDL: 39.8 mg/dL (ref 0.0–40.0)
WBC: 6.5 10*3/uL (ref 4.5–10.5)

## 2009-11-03 ENCOUNTER — Encounter: Payer: Self-pay | Admitting: Internal Medicine

## 2009-11-05 ENCOUNTER — Encounter: Payer: Self-pay | Admitting: Internal Medicine

## 2009-11-05 LAB — CONVERTED CEMR LAB
INR: 1.79 — ABNORMAL HIGH (ref <1.50)
POC INR: 1.79
Prothrombin Time: 20.6 s

## 2009-11-11 ENCOUNTER — Encounter: Payer: Self-pay | Admitting: Pulmonary Disease

## 2009-11-18 ENCOUNTER — Encounter: Payer: Self-pay | Admitting: Internal Medicine

## 2009-11-18 ENCOUNTER — Encounter: Payer: Self-pay | Admitting: Pulmonary Disease

## 2009-11-19 ENCOUNTER — Encounter: Payer: Self-pay | Admitting: Internal Medicine

## 2009-11-19 LAB — CONVERTED CEMR LAB
POC INR: 2.6
Prothrombin Time: 26.3 s

## 2009-11-22 ENCOUNTER — Telehealth (INDEPENDENT_AMBULATORY_CARE_PROVIDER_SITE_OTHER): Payer: Self-pay | Admitting: *Deleted

## 2009-11-24 ENCOUNTER — Telehealth: Payer: Self-pay | Admitting: Pulmonary Disease

## 2009-12-09 ENCOUNTER — Encounter: Payer: Self-pay | Admitting: Internal Medicine

## 2009-12-10 ENCOUNTER — Encounter: Payer: Self-pay | Admitting: Cardiology

## 2009-12-21 ENCOUNTER — Ambulatory Visit: Payer: Self-pay | Admitting: Internal Medicine

## 2009-12-21 DIAGNOSIS — I5022 Chronic systolic (congestive) heart failure: Secondary | ICD-10-CM

## 2009-12-21 LAB — CONVERTED CEMR LAB
BUN: 45 mg/dL — ABNORMAL HIGH (ref 6–23)
Chloride: 98 meq/L (ref 96–112)
Eosinophils Relative: 4.1 % (ref 0.0–5.0)
Glucose, Bld: 97 mg/dL (ref 70–99)
HCT: 43.7 % (ref 39.0–52.0)
Lymphs Abs: 1.8 10*3/uL (ref 0.7–4.0)
MCV: 98 fL (ref 78.0–100.0)
Monocytes Absolute: 1 10*3/uL (ref 0.1–1.0)
Platelets: 235 10*3/uL (ref 150.0–400.0)
Potassium: 5 meq/L (ref 3.5–5.1)
Prothrombin Time: 21.3 s — ABNORMAL HIGH (ref 9.1–11.7)
RDW: 14.9 % — ABNORMAL HIGH (ref 11.5–14.6)
WBC: 7.4 10*3/uL (ref 4.5–10.5)

## 2009-12-24 ENCOUNTER — Telehealth: Payer: Self-pay | Admitting: Internal Medicine

## 2009-12-28 ENCOUNTER — Ambulatory Visit: Payer: Self-pay | Admitting: Internal Medicine

## 2009-12-28 ENCOUNTER — Ambulatory Visit (HOSPITAL_COMMUNITY): Admission: RE | Admit: 2009-12-28 | Discharge: 2009-12-28 | Payer: Self-pay | Admitting: Internal Medicine

## 2009-12-29 ENCOUNTER — Encounter: Payer: Self-pay | Admitting: Internal Medicine

## 2009-12-29 ENCOUNTER — Telehealth: Payer: Self-pay | Admitting: Internal Medicine

## 2010-01-04 ENCOUNTER — Telehealth: Payer: Self-pay | Admitting: Internal Medicine

## 2010-01-05 ENCOUNTER — Telehealth (INDEPENDENT_AMBULATORY_CARE_PROVIDER_SITE_OTHER): Payer: Self-pay | Admitting: *Deleted

## 2010-01-06 ENCOUNTER — Encounter (INDEPENDENT_AMBULATORY_CARE_PROVIDER_SITE_OTHER): Payer: Self-pay | Admitting: Pharmacist

## 2010-01-06 LAB — CONVERTED CEMR LAB
CO2: 22 meq/L (ref 19–32)
Chloride: 97 meq/L (ref 96–112)
Creatinine, Ser: 3.56 mg/dL — ABNORMAL HIGH (ref 0.40–1.50)
INR: 2.73 — ABNORMAL HIGH (ref <1.50)
Potassium: 4.6 meq/L (ref 3.5–5.3)

## 2010-01-07 ENCOUNTER — Encounter: Payer: Self-pay | Admitting: Cardiovascular Disease

## 2010-01-13 ENCOUNTER — Ambulatory Visit: Payer: Self-pay

## 2010-02-08 ENCOUNTER — Encounter: Payer: Self-pay | Admitting: Internal Medicine

## 2010-02-08 ENCOUNTER — Encounter: Payer: Self-pay | Admitting: Pulmonary Disease

## 2010-02-08 LAB — CONVERTED CEMR LAB
POC INR: 1.8
Prothrombin Time: 18.6 s

## 2010-02-14 ENCOUNTER — Encounter: Payer: Self-pay | Admitting: Internal Medicine

## 2010-03-07 ENCOUNTER — Encounter: Payer: Self-pay | Admitting: Internal Medicine

## 2010-03-07 ENCOUNTER — Encounter: Payer: Self-pay | Admitting: Cardiovascular Disease

## 2010-03-07 LAB — CONVERTED CEMR LAB
POC INR: 2.06
Prothrombin Time: 23.4 s

## 2010-03-08 ENCOUNTER — Encounter: Payer: Self-pay | Admitting: Cardiovascular Disease

## 2010-03-08 ENCOUNTER — Telehealth (INDEPENDENT_AMBULATORY_CARE_PROVIDER_SITE_OTHER): Payer: Self-pay | Admitting: *Deleted

## 2010-03-09 LAB — CONVERTED CEMR LAB
CO2: 26 meq/L (ref 19–32)
Calcium: 9.1 mg/dL (ref 8.4–10.5)
INR: 2.06 — ABNORMAL HIGH (ref <1.50)
Prothrombin Time: 23.4 s — ABNORMAL HIGH (ref 11.6–15.2)
Sodium: 140 meq/L (ref 135–145)

## 2010-03-10 ENCOUNTER — Encounter: Payer: Self-pay | Admitting: Pulmonary Disease

## 2010-03-24 ENCOUNTER — Encounter: Payer: Self-pay | Admitting: Internal Medicine

## 2010-03-24 LAB — CONVERTED CEMR LAB
CO2: 27 meq/L (ref 19–32)
Chloride: 98 meq/L (ref 96–112)
Glucose, Bld: 97 mg/dL (ref 70–99)
Potassium: 5.5 meq/L — ABNORMAL HIGH (ref 3.5–5.3)
Prothrombin Time: 24 s — ABNORMAL HIGH (ref 11.6–15.2)
Sodium: 140 meq/L (ref 135–145)

## 2010-03-25 ENCOUNTER — Telehealth: Payer: Self-pay | Admitting: Internal Medicine

## 2010-03-28 ENCOUNTER — Encounter: Payer: Self-pay | Admitting: Internal Medicine

## 2010-03-28 ENCOUNTER — Encounter: Payer: Self-pay | Admitting: Cardiology

## 2010-03-28 LAB — CONVERTED CEMR LAB
BUN: 44 mg/dL — ABNORMAL HIGH (ref 6–23)
CO2: 24 meq/L (ref 19–32)
Chloride: 97 meq/L (ref 96–112)
Glucose, Bld: 183 mg/dL — ABNORMAL HIGH (ref 70–99)
Potassium: 4 meq/L (ref 3.5–5.3)

## 2010-03-29 ENCOUNTER — Encounter: Payer: Self-pay | Admitting: Cardiology

## 2010-04-06 ENCOUNTER — Encounter: Payer: Self-pay | Admitting: Pulmonary Disease

## 2010-04-12 ENCOUNTER — Ambulatory Visit: Payer: Self-pay | Admitting: Internal Medicine

## 2010-04-13 ENCOUNTER — Encounter: Payer: Self-pay | Admitting: Pulmonary Disease

## 2010-04-14 ENCOUNTER — Ambulatory Visit: Payer: Self-pay | Admitting: Pulmonary Disease

## 2010-04-18 ENCOUNTER — Encounter: Payer: Self-pay | Admitting: Internal Medicine

## 2010-04-18 LAB — CONVERTED CEMR LAB
POC INR: 2.19
Prothrombin Time: 24.5 s — ABNORMAL HIGH (ref 11.6–15.2)

## 2010-05-13 ENCOUNTER — Encounter: Payer: Self-pay | Admitting: Pulmonary Disease

## 2010-05-17 ENCOUNTER — Encounter: Payer: Self-pay | Admitting: Internal Medicine

## 2010-05-18 ENCOUNTER — Encounter: Payer: Self-pay | Admitting: Cardiology

## 2010-05-18 LAB — CONVERTED CEMR LAB
INR: 1.97 — ABNORMAL HIGH (ref <1.50)
POC INR: 1.97
Prothrombin Time: 22.6 s — ABNORMAL HIGH (ref 11.6–15.2)

## 2010-05-24 ENCOUNTER — Telehealth (INDEPENDENT_AMBULATORY_CARE_PROVIDER_SITE_OTHER): Payer: Self-pay | Admitting: *Deleted

## 2010-05-30 ENCOUNTER — Encounter: Payer: Self-pay | Admitting: Pulmonary Disease

## 2010-05-30 ENCOUNTER — Encounter: Payer: Self-pay | Admitting: Internal Medicine

## 2010-06-01 ENCOUNTER — Encounter: Payer: Self-pay | Admitting: Internal Medicine

## 2010-06-01 LAB — CONVERTED CEMR LAB: POC INR: 2.18

## 2010-06-03 ENCOUNTER — Encounter: Payer: Self-pay | Admitting: Internal Medicine

## 2010-06-22 ENCOUNTER — Encounter: Payer: Self-pay | Admitting: Internal Medicine

## 2010-06-23 ENCOUNTER — Encounter: Payer: Self-pay | Admitting: Cardiovascular Disease

## 2010-06-23 LAB — CONVERTED CEMR LAB
POC INR: 1.92
Prothrombin Time: 22.1 s

## 2010-06-24 ENCOUNTER — Encounter: Payer: Self-pay | Admitting: Pulmonary Disease

## 2010-07-06 ENCOUNTER — Encounter: Payer: Self-pay | Admitting: Internal Medicine

## 2010-07-06 LAB — CONVERTED CEMR LAB: POC INR: 1.78

## 2010-07-07 ENCOUNTER — Encounter: Payer: Self-pay | Admitting: Internal Medicine

## 2010-07-14 ENCOUNTER — Ambulatory Visit: Payer: Self-pay | Admitting: Internal Medicine

## 2010-07-14 ENCOUNTER — Ambulatory Visit (HOSPITAL_COMMUNITY)
Admission: RE | Admit: 2010-07-14 | Discharge: 2010-07-14 | Payer: Self-pay | Source: Home / Self Care | Attending: Urology | Admitting: Urology

## 2010-07-20 ENCOUNTER — Encounter: Payer: Self-pay | Admitting: Internal Medicine

## 2010-07-22 ENCOUNTER — Encounter: Payer: Self-pay | Admitting: Internal Medicine

## 2010-07-22 ENCOUNTER — Encounter: Payer: Self-pay | Admitting: Cardiology

## 2010-07-22 LAB — CONVERTED CEMR LAB
POC INR: 2.86
Prothrombin Time: 30.1 s

## 2010-07-25 ENCOUNTER — Encounter: Payer: Self-pay | Admitting: Cardiology

## 2010-07-26 LAB — CONVERTED CEMR LAB: Prothrombin Time: 30.1 s — ABNORMAL HIGH (ref 11.6–15.2)

## 2010-08-04 ENCOUNTER — Encounter: Payer: Self-pay | Admitting: Cardiology

## 2010-08-04 ENCOUNTER — Encounter: Payer: Self-pay | Admitting: Internal Medicine

## 2010-08-04 LAB — CONVERTED CEMR LAB
BUN: 62 mg/dL — ABNORMAL HIGH (ref 6–23)
Calcium: 9.4 mg/dL (ref 8.4–10.5)
Glucose, Bld: 93 mg/dL (ref 70–99)
POC INR: 3.56

## 2010-08-05 ENCOUNTER — Encounter: Payer: Self-pay | Admitting: Cardiology

## 2010-08-18 ENCOUNTER — Encounter: Payer: Self-pay | Admitting: Internal Medicine

## 2010-08-24 ENCOUNTER — Telehealth: Payer: Self-pay | Admitting: Internal Medicine

## 2010-08-30 ENCOUNTER — Ambulatory Visit
Admission: RE | Admit: 2010-08-30 | Discharge: 2010-08-30 | Payer: Self-pay | Source: Home / Self Care | Attending: Internal Medicine | Admitting: Internal Medicine

## 2010-08-30 ENCOUNTER — Other Ambulatory Visit: Payer: Self-pay | Admitting: Internal Medicine

## 2010-08-30 DIAGNOSIS — N184 Chronic kidney disease, stage 4 (severe): Secondary | ICD-10-CM | POA: Insufficient documentation

## 2010-08-30 LAB — RENAL FUNCTION PANEL
Albumin: 3.6 g/dL (ref 3.5–5.2)
CO2: 26 mEq/L (ref 19–32)
Calcium: 9.3 mg/dL (ref 8.4–10.5)
Glucose, Bld: 131 mg/dL — ABNORMAL HIGH (ref 70–99)
Potassium: 3.7 mEq/L (ref 3.5–5.1)

## 2010-09-02 ENCOUNTER — Other Ambulatory Visit: Payer: Self-pay | Admitting: Nephrology

## 2010-09-02 DIAGNOSIS — N184 Chronic kidney disease, stage 4 (severe): Secondary | ICD-10-CM

## 2010-09-04 LAB — CONVERTED CEMR LAB
BUN: 39 mg/dL — ABNORMAL HIGH (ref 6–23)
Chloride: 100 meq/L (ref 96–112)
Glucose, Bld: 179 mg/dL — ABNORMAL HIGH (ref 70–99)
INR: 2.2 — ABNORMAL HIGH (ref 0.0–1.5)
Potassium: 3.9 meq/L (ref 3.5–5.3)
Prothrombin Time: 26 s — ABNORMAL HIGH (ref 11.6–15.2)
Sodium: 137 meq/L (ref 135–145)

## 2010-09-08 ENCOUNTER — Other Ambulatory Visit: Payer: Self-pay

## 2010-09-08 NOTE — Letter (Signed)
[facility]   Imported By: Sherian Rein 07/11/2010 08:44:35  _____________________________________________________________________  External Attachment:    Type:   Image     Comment:   External Document

## 2010-09-08 NOTE — Medication Information (Signed)
Summary: rov/tm  Anticoagulant Therapy  Managed by: Windell Hummingbird, RN Referring MD: Sharrell Ku PCP: Alroy Dust, MD Supervising MD: Graciela Husbands MD, Viviann Spare Indication 1: Atrial Fibrillation Indication 2: Coronary Artery Disease Lab Used: [facility] [place] Site: [address] INR POC 2.9 INR RANGE 2.0-3.0  Dietary changes: no    Health status changes: no    Bleeding/hemorrhagic complications: no    Recent/future hospitalizations: no    Any changes in medication regimen? no    Recent/future dental: no  Any missed doses?: no       Is patient compliant with meds? yes       Allergies: 1)  ! Penicillin 2)  ! Amoxicillin 3)  ! * Ivp Dye 4)  ! * Clindamycin  Anticoagulation Management History:      Positive risk factors for bleeding include an age of 75 years or older and presence of serious comorbidities.  The bleeding index is 'intermediate risk'.  Positive CHADS2 values include History of CHF, History of HTN, and Age > 80 years old.  The start date was 09/13/2006.  His last INR was 3.43.  Anticoagulation responsible provider: Graciela Husbands MD, Viviann Spare.  INR POC: 2.9.  Cuvette Lot#: [id number].  Exp: 08/2011.    Anticoagulation Management Assessment/Plan:      The patient's current anticoagulation dose is Coumadin 5 mg tabs: Take as directed by coumadin clinic..  The target INR is 2.0-3.0.  The next INR is due 09/20/2010.  Anticoagulation instructions were given to spouse.  Results were reviewed/authorized by Windell Hummingbird, RN.  He was notified by Windell Hummingbird, RN.         Prior Anticoagulation Instructions: INR 3.43  Called spoke with pt's daughter advised to hols x 1 dose, then start taking 5mg  daily except 2.5mg  on Mondays.  Recheck in 2 weeks.    Current Anticoagulation Instructions: INR 2.9  Continue with 1 tablet every day except 1/2 tablet on Monday.

## 2010-09-08 NOTE — Medication Information (Signed)
Summary: [facility]  Anticoagulant Therapy  Managed by: [name], PharmD Referring MD: Sharrell Ku PCP: Alroy Dust, MD Supervising MD: Graciela Husbands MD, Viviann Spare Indication 1: Coronary Artery Disease (ICD-CAD) Lab Used: [facility] Site: [address] PT 26.3 INR POC 2.6 INR RANGE 2.0-3.0  Dietary changes: no    Health status changes: no    Bleeding/hemorrhagic complications: no    Recent/future hospitalizations: no    Any changes in medication regimen? yes       Details: increased furosemide to 2 pills in am and 1 pill in pm   Recent/future dental: no  Any missed doses?: no       Is patient compliant with meds? yes      Comments: Had lab work done at Dr. Deterding's office   Allergies: 1)  ! Penicillin 2)  ! Amoxicillin 3)  ! * Ivp Dye  Anticoagulation Management History:      Positive risk factors for bleeding include an age of 12 years or older and presence of serious comorbidities.  The bleeding index is 'intermediate risk'.  Positive CHADS2 values include History of HTN and Age > 12 years old.  The start date was 09/13/2006.  His last INR was 1.79.  Prothrombin time is 26.3.  Anticoagulation responsible provider: Graciela Husbands MD, Viviann Spare.  INR POC: 2.6.    Anticoagulation Management Assessment/Plan:      The patient's current anticoagulation dose is Coumadin 5 mg tabs: Take as directed by coumadin clinic..  The target INR is 2.0-3.0.  The next INR is due 12/09/2009.  Anticoagulation instructions were given to spouse.  Results were reviewed/authorized by [name], PharmD.  He was notified by [name] PharmD.         Prior Anticoagulation Instructions: INR 1.79 Change dose to 7.5mg s today then change dose to 5mg s daily except 2.5mg s on Sundays and Thursdays. Recheck INR in 2 weeks. Spouse aware of dose and redraw date.   Current Anticoagulation Instructions: INR 2.6   Continue same dose of 5mg  every day except 1/2 tablet on Sunday and Thursday.  Recheck  [facility] at [facility] [employer].  Gave instructions to pt's wife.

## 2010-09-08 NOTE — Progress Notes (Signed)
Summary: wants a insentive spirometer  Phone Note Call from Patient Call back at Home Phone [phone]   Caller: [name] (wife) Call For: nadel Summary of Call: her daughter is a Engineer, civil (consulting) and she wanted to ask dr [name] if he would order a insenitive spirometer for [name].  [facility] [phone] Initial call taken by: Valinda Hoar,  November 22, 2009 12:26 PM  Follow-up for Phone Call        Please advise if this is okay to send order for incentive spirometer, thanks Vernie Murders  November 22, 2009 3:10 PM   order has been sent for the spirometer. Randell Loop [facility]  November 22, 2009 5:33 PM

## 2010-09-08 NOTE — Letter (Signed)
[facility]   Imported By: Marylou Mccoy 02/17/2010 10:03:38  _____________________________________________________________________  External Attachment:    Type:   Image     Comment:   External Document

## 2010-09-08 NOTE — Letter (Signed)
Summary: [facility]   Imported By: Lester [place] 03/09/2010 10:18:28  _____________________________________________________________________  External Attachment:    Type:   Image     Comment:   External Document

## 2010-09-08 NOTE — Letter (Signed)
Summary: [facility]   Imported By: Lennie Odor 11/30/2009 11:22:47  _____________________________________________________________________  External Attachment:    Type:   Image     Comment:   External Document

## 2010-09-08 NOTE — Progress Notes (Signed)
Summary: lab results   Phone Note Call from Patient Call back at Home Phone [phone]   Caller: Patient Summary of Call: lab results Initial call taken by: Judie Grieve,  March 25, 2010 4:41 PM  Follow-up for Phone Call        pt to avoid K rich foods and will get a repeat BMP on Monday.  I have faxed both 03/07/10 and 03/24/10 labs to Dr Deterding for them to review as he follows pt for his kidney disease.  Spoke with both wife and granddaughter in regards to his labs.  They will watch pt closely over the weekend and if his condition deteriorates will take him [facility]. Dennis Bast, RN, BSN  March 25, 2010 5:43 PM Left message for Pam at Dr Deterdings office in regards to pt and left message for her to call me  Dennis Bast, RN, BSN  March 28, 2010 11:52 AM  Additional Follow-up for Phone Call Additional follow up Details #1::        [facility] for Pam again to call me regarding pt. Dennis Bast, RN, BSN  March 28, 2010 12:40 PM spoke with Elita Quick and pt's granddaughter  K=looks better and kiney function is stable per Dr Darrick Penna.  Will call back if needs more help Dennis Bast, RN, BSN  March 28, 2010 5:37 PM

## 2010-09-08 NOTE — Medication Information (Signed)
Summary: [facility] Clinic  Anticoagulant Therapy  Managed by: [name], PharmD Referring MD: Sharrell Ku PCP: Alroy Dust, MD Supervising MD: Daleen Squibb MD, Maisie Fus Indication 1: Atrial Fibrillation Indication 2: Coronary Artery Disease Lab Used: [facility] [place] Site: [address] PT 75.1 INR POC 2.86 INR RANGE 2.0-3.0  Dietary changes: no    Health status changes: no    Bleeding/hemorrhagic complications: no    Recent/future hospitalizations: no    Any changes in medication regimen? no    Recent/future dental: no  Any missed doses?: no       Is patient compliant with meds? yes       Allergies: 1)  ! Penicillin 2)  ! Amoxicillin 3)  ! * Ivp Dye 4)  ! * Clindamycin  Anticoagulation Management History:      His anticoagulation is being managed by telephone today.  Positive risk factors for bleeding include an age of 75 years or older and presence of serious comorbidities.  The bleeding index is 'intermediate risk'.  Positive CHADS2 values include History of CHF, History of HTN, and Age > 75 years old.  The start date was 09/13/2006.  His last INR was 1.78.  Prothrombin time is 30.1.  Anticoagulation responsible Lafaye Mcelmurry: Daleen Squibb MD, Maisie Fus.  INR POC: 2.86.    Anticoagulation Management Assessment/Plan:      The patient's current anticoagulation dose is Coumadin 5 mg tabs: Take as directed by coumadin clinic..  The target INR is 2.0-3.0.  The next INR is due 08/05/2010.  Anticoagulation instructions were given to spouse.  Results were reviewed/authorized by [name], PharmD.  He was notified by Bethena Midget, RN, BSN.         Prior Anticoagulation Instructions: INR 1.78 LMOM to call dosing. Bethena Midget, RN, BSN  July 07, 2010 3:07 PM  Today take 7.5mg s then change 5mg s daily except 7.5mg s on Mondays. Recheck in 2 weeks.   Current Anticoagulation Instructions: INR 2.86  Spoke with pt's wife. Continue 5mg s daily except 7.5mg s on Mondays. Recheck in 2 weeks.   [name]  PharmD  July 26, 2010 10:49 AM

## 2010-09-08 NOTE — Medication Information (Signed)
Summary: [facility]   Imported By: Valinda Hoar 03/10/2010 08:40:00  _____________________________________________________________________  External Attachment:    Type:   Image     Comment:   External Document

## 2010-09-08 NOTE — Assessment & Plan Note (Signed)
Summary: 6 months/apc   Primary Care Provider:  Alroy Dust, MD  CC:  6 month ROV & review of mult medical problems....  History of Present Illness: 77 y/o WM here for a follow up visit... he has mult medical problems as noted below...    ~  January 21, 2009:  states that he is doing OK- he & wife continue to see Chiropractor ever 6wk for Rx... he remains on Coumadin w/ Protimes by lab in [place] called to [facility]...      ** had GI f/u DrStark 3/10- reflux w/ LER, cough, hoarse;  Rx'd Nexium & antireflux reg- improved.      ** had f/u Nephrology DrDeterding 3/10- CKD w/ Creat= 1.9; RTA on Bicarb 2Bid w/ HCO3= 27; secondary hyperpara on Calcitriol 109mcg/d w/ Ca= 8.5, Phos= 3.9 & PTH= 56; Hg norm= 13.7 & Fe= 117.      ** had f/u Ortho DrDuda 4/10- insensate neuropathy w/ onychomycosis nails...      ** had f/u Urology DrDavis- we do not have any notes, but pt requests PSA sent to him...   ~  October 14, 2009:  he maintains regular f/u w/ DrDeterding- note of 12/10 reviewed: CKD w/ Creat 1.9 to 2.3, & RTA on Bicarb tabs... he adjusts diuretics based on his wt, no changes made... he saw DrTaylor 2/11 for f/u ischemic cardiomyopathy, hx VT w/ implanted defib, AFib on Amio... ICD battery may be near end of life & wife very upset about conversation regarding poss not replacing the unit due to comorbidities etc... for his part MrDaniels has no complaints- wife indicates local care in [place] from DrProchnau.   ~  April 17, 2010:  he had a new cardioverter defibrillator implanted by DrTaylor 5/11, and an antibiotic reaction to the Clindamycin used post op (he's PCN allergic)... he is cared for by LMD in [place] DrProchnau> episode of confusion 2 wks ago, wife wanted Creat checked & "it was off" now back to baseline... he still sees DrDeterding every 61mo... MrDaniels has no complaints or concerns by his hx...   Current Problem List:  HYPERTENSION (ICD-401.9) - controlled on Rx w/ COREG 6.25Bid,  HYDRALAZINE 10mg  taking 2tabsTid, ISOSORBIDE 10mg Tid, LASIX 40mg /d, + AMIODARONE 200mg - 1/2 tab daily... BP= 114/62 and tolerating meds well...  denies HA, visual changes, CP, palipit, dizziness, syncope, dyspnea, edema, etc...   CORONARY ARTERY DISEASE (ICD-414.00) - S/P CABG x8 in 1998 by DrWilson for 3vessel CAD... followed by DrJoe for yrs, now Print production planner... he gets his defib checked remotely on a regular basis...  ~  cath 2001 showed 50%LMain, native 3 vessel dis, grafts OK, EF= 45%...  ~  2DEcho 8/07 showed sl dil LV w/ EF= 20%, HK & AK diffusely, dil LA, mild AI, trivMR...  ~  adm by Cards 8/08 for CP felt to be non-cardiac...  ISCHEMIC CARDIOMYOPATHY (ICD-414.8) - he had defibrillator placed 2003 for VT... he is also on COUMADIN followed in the [facility]madin Clinic... he is followed regularly by DrTaylor...  2DEcho 8/07 w/ HK & AK, EF=20% (worse than 2006)...  ~  adm 3/07 by Cards w/ decomp CHF & he was diuresed w/ help of Nephrology.  ~  labs 6/10 showed BNP= 233  ~  5/11:  new cardioverter defibrillator placed by DrTaylor.  Hx of ATRIAL FLUTTER (ICD-427.32) - on Amio + Coumadin... S/P AICD 12/03...   PERIPHERAL VASCULAR DISEASE (ICD-443.9) - known mild carotid dis w/ 0-39% bilat, and mod right vertebral stenosis on CDoppler 12/08... normal  Ao & iliacs 4/05 dopplers...  HYPERCHOLESTEROLEMIA (ICD-272.0) - on diet alone + FISH OIL ("per DrHecker- it's good for the eyes")...   ~  FLP 8/08 showed TChol 171, TG 150, HDL 51, LDL 90  ~  FLP 6/10 showed TChol 180, TG 180, HDL 52, LDL 92... continue fish oil, better low fat diet.  ~  FLP 3/11 showed TChol 190, TG 199, HDL 61, LDL 89  HYPOTHYROIDISM (ICD-244.9) - on LEVOTHYROID 135mcg/d...  ~  lab 8/08 w/ TSH= 1.51  ~  lab 6/10 showed TSH= 1.18... continue same.  ~  lab 3/11 showed TSH= 1.46  Hx of ISCHEMIC COLITIS (ICD-557.9) - occured after his CABG in 1998... resulted in total colectomy and ileostomy... long post-op course w/ grafted abd wound  etc... ileostomy working well- no complaints...  RENAL INSUFFICIENCY, CHRONIC (ICD-585.9) - followed by DrDeterding Q4-98months w/ labs etc... on CALCITRIOL 0.5mg /d and BICARB 2tabsBid for a RTA...  ~  labs from Nephrology 11/09 showed BUN= 50, Creat= 2.1  ~  labs from Nephrology 3/10 showed BUN= 34, Creat= 1.92, HCO3= 27  ~  labs 3/11 showed BUN= 35, Creat= 2.2  Hx of PROSTATE CANCER (ICD-185) & BENIGN PROSTATIC HYPERTROPHY, WITH OBSTRUCTION (ICD-600.01) - followed by GNFAOZHY on FLOMAX 0.4mg /d...  History of prostate cancer in 2003 w/ XRT by DrGoodchild...  ~  pt indicates that DrDavis follows his PSA's...  ~  labs here 6/10 showed PSA=  3.74  ~  labs here 3/11 showed PSA= 5.46... copy to DrDavis.  DEGENERATIVE JOINT DISEASE, GENERALIZED (ICD-715.00) - he takes DCN100 Prn for pain...  DEMENTIA, MILD (ICD-294.8) - on ARICEPT 10mg /d...  ANXIETY (ICD-300.00) - on AMITRIPTYLINE 25mg Qhs for sleep, and ALPRAZOLAM 0.5mg  Prn for nerves...   Preventive Screening-Counseling & Management  Alcohol-Tobacco     Smoking Status: quit     Year Quit: 1953  Allergies: 1)  ! Penicillin 2)  ! Amoxicillin 3)  ! * Ivp Dye 4)  ! * Clindamycin  Comments:  Nurse/Medical Assistant: The patient's medications and allergies were reviewed with the patient and were updated in the Medication and Allergy Lists.  Past History:  Past Medical History: HYPERTENSION (ICD-401.9) CORONARY ARTERY DISEASE (ICD-414.00) ISCHEMIC CARDIOMYOPATHY (ICD-414.8) CHRONIC SYSTOLIC HEART FAILURE (ICD-428.22) AUTOMATIC IMPLANTABLE CARDIAC DEFIBRILLATOR SITU (ICD-V45.02) ATRIAL FIBRILLATION (ICD-427.31) Hx of ATRIAL FLUTTER (ICD-427.32) PERIPHERAL VASCULAR DISEASE (ICD-443.9) HYPERCHOLESTEROLEMIA (ICD-272.0) HYPOTHYROIDISM (ICD-244.9) Hx of ISCHEMIC COLITIS (ICD-557.9) RENAL INSUFFICIENCY, CHRONIC (ICD-585.9) BENIGN PROSTATIC HYPERTROPHY, WITH OBSTRUCTION (ICD-600.01) Hx of PROSTATE CANCER (ICD-185) DEGENERATIVE JOINT  DISEASE, GENERALIZED (ICD-715.00) DEMENTIA, MILD (ICD-294.8) ANXIETY (ICD-300.00) ANEMIA (ICD-285.9)  Past Surgical History: S/P CABG 1998 S/P total colectomy & ileostomy for ischemic colitis 1998  Family History: Reviewed history from 12/20/2009 and no changes required. Family History of Colon Cancer:Brother Family History of Esophageal Cancer:Brother Family History of Diabetes: Mother Family History of Heart Disease: Father  Social History: Reviewed history from 12/20/2009 and no changes required. Occupation: Retired [name] a former smoker.  Alcohol Use - no Illicit Drug Use - no married to peggy Evola  Review of Systems      See HPI       The patient complains of peripheral edema and muscle weakness.  The patient denies anorexia, fever, weight loss, weight gain, vision loss, decreased hearing, hoarseness, chest pain, syncope, dyspnea on exertion, prolonged cough, headaches, hemoptysis, abdominal pain, melena, hematochezia, severe indigestion/heartburn, hematuria, incontinence, suspicious skin lesions, transient blindness, difficulty walking, depression, unusual weight change, abnormal bleeding, enlarged lymph nodes, and angioedema.    Vital Signs:  Patient profile:   77 year old male Height:      66 inches Weight:      188.50 pounds BMI:     30.53 O2 Sat:      99 % on Room air Temp:     96.9 degrees F oral Pulse rate:   53 / minute BP sitting:   114 / 62  (right arm) Cuff size:   regular  Vitals Entered By: Randell Loop CMA (April 14, 2010 10:09 AM)  O2 Sat at Rest %:  99 O2 Flow:  Room air CC: 6 month ROV & review of mult medical problems... Is Patient Diabetic? No Pain Assessment Patient in pain? no      Comments meds updated today with pt   Physical Exam  Additional Exam:  WD, Overweight, 77 y/o WM in NAD... GENERAL:  Alert, pleasant & cooperative... HEENT:  [place]/AT, EOM-wnl, PERRLA, EACs-clear, TMs-wnl, NOSE-clear, THROAT-clear & wnl, sl dry  MM's... NECK:  Supple w/ fairROM; no JVD; normal carotid impulses w/o bruits; no thyromegaly or nodules palpated; no lymphadenopathy. CHEST:  Clear to P & A; without wheezes/ rales/ or rhonchi heard... HEART:  Regular Rhythm, gr 1-2/6 SEM S4 gallop, no rubs or S3... ABDOMEN:  Ileotomy in right side- functioning normally, large abd wound/ graft/ defect- w/ binder... no organomegaly or masses. EXT: without deformities, mild arthritic changes; no varicose veins/ +venous insuffic/ tr edema. NEURO:  CN's intact; periph neuropathy bilat w/o focal deficits... DERM:  No lesions noted; no rash etc...    MISC. Report  Procedure date:  04/14/2010  Findings:      DATA REVIEWED:  ~  Prev EMR notes & notes from DrDeterding, Renal...  ~  Notes from DrTaylor & hosp record 5/11 defibrillator change.   Impression & Recommendations:  Problem # 1:  HYPERTENSION (ICD-401.9) Controlled>  same meds. His updated medication list for this problem includes:    Coreg 6.25 Mg Tabs (Carvedilol) .Marland Kitchen... Take one tablet by mouth twice daily.    Hydralazine Hcl 10 Mg Tabs (Hydralazine hcl) .Marland Kitchen... Take 2 tablets by mouth every 8 hours    Furosemide 40 Mg Tabs (Furosemide) .Marland Kitchen... Take 2 in am 1 in pm  Problem # 2:  CORONARY ARTERY DISEASE (ICD-414.00) Ischemic cardiomyopathy... no angina, denies SOB, etc... His updated medication list for this problem includes:    Isosorbide Dinitrate 10 Mg Tabs (Isosorbide dinitrate) .Marland Kitchen... Take 1 tablet by mouth three times a day as directed...    Coreg 6.25 Mg Tabs (Carvedilol) .Marland Kitchen... Take one tablet by mouth twice daily.    Hydralazine Hcl 10 Mg Tabs (Hydralazine hcl) .Marland Kitchen... Take 2 tablets by mouth every 8 hours    Pacerone 200 Mg Tabs (Amiodarone hcl) .Marland Kitchen... Take 1/2 tab by mouth once daily or as directed    Furosemide 40 Mg Tabs (Furosemide) .Marland Kitchen... Take 2 in am 1 in pm    Nitroglycerin 0.4 Mg Subl (Nitroglycerin) ..... One tablet under tongue every 5 minutes as needed for chest  pain---may repeat times three  Problem # 3:  AUTOMATIC IMPLANTABLE CARDIAC DEFIBRILLATOR SITU (ICD-V45.02) Followed by DrTaylor...  Problem # 4:  HYPERCHOLESTEROLEMIA (ICD-272.0) On diet + Fish Oil... administered by his wife who is in charge...  Problem # 5:  HYPOTHYROIDISM (ICD-244.9) Stable on Levothy 100/d... His updated medication list for this problem includes:    Levothyroxine Sodium 100 Mcg Tabs (Levothyroxine sodium) .Marland Kitchen... Take 1 tab by mouth once daily...  Problem # 6:  Hx of ISCHEMIC COLITIS (  ICD-557.9) He has ileostomy... he has not seen GI in some time...  Problem # 7:  RENAL INSUFFICIENCY, CHRONIC (ICD-585.9) Followed by Drdeterding...  Problem # 8:  OTHER MEDICAL PROBLEMS AS NOTED>>>  Complete Medication List: 1)  Coumadin 5 Mg Tabs (Warfarin sodium) .... Take as directed by coumadin clinic. 2)  Isosorbide Dinitrate 10 Mg Tabs (Isosorbide dinitrate) .... Take 1 tablet by mouth three times a day as directed... 3)  Coreg 6.25 Mg Tabs (Carvedilol) .... Take one tablet by mouth twice daily. 4)  Hydralazine Hcl 10 Mg Tabs (Hydralazine hcl) .... Take 2 tablets by mouth every 8 hours 5)  Pacerone 200 Mg Tabs (Amiodarone hcl) .... Take 1/2 tab by mouth once daily or as directed 6)  Furosemide 40 Mg Tabs (Furosemide) .... Take 2 in am 1 in pm 7)  Fish Oil 1000 Mg Caps (Omega-3 fatty acids) .... 2 tabs daily 8)  Levothyroxine Sodium 100 Mcg Tabs (Levothyroxine sodium) .... Take 1 tab by mouth once daily.Marland KitchenMarland Kitchen 9)  Omeprazole 40 Mg Cpdr (Omeprazole) .... One tablet by mouth once daily 10)  Flomax 0.4 Mg Cp24 (Tamsulosin hcl) .... Take 1 tab by mouth at bedtime as directed by drdavis... 11)  Calcitriol 0.5 Mcg Caps (Calcitriol) .Marland Kitchen.. 1 tablets bid drdeterding... 12)  Sodium Bicarbonate 648 Mg Tabs (Sodium bicarbonate (antacid)) .... Take 2 two tabs twice daily as directed by drdeterding... 13)  Aricept 10 Mg Tabs (Donepezil hcl) .... Take 1 tablet by mouth once a day 14)   Amitriptyline Hcl 25 Mg Tabs (Amitriptyline hcl) .... Take one tablet at bedtime 15)  Alprazolam 0.5 Mg Tabs (Alprazolam) .... Take 1 tablet by mouth three times a day as needed anxiety 16)  Multivitamins Tabs (Multiple vitamin) .... Once daily 17)  Nitroglycerin 0.4 Mg Subl (Nitroglycerin) .... One tablet under tongue every 5 minutes as needed for chest pain---may repeat times three  Patient Instructions: 1)  Today we updated your med list- see below.... 2)  Continue your current meds the same... 3)  Be sure to get the 2011 Flu vaccine at your earliest convenience... 4)  Call for any problems.Marland KitchenMarland Kitchen 5)  Please schedule a follow-up appointment in 6 months, or let me know if we can be of assistance in any way...   Immunization History:  Influenza Immunization History:    Influenza:  historical (05/27/2009)

## 2010-09-08 NOTE — Medication Information (Signed)
Summary: [facility]  Anticoagulant Therapy  Managed by: [name], PharmD Referring MD: Sharrell Ku PCP: Alroy Dust, MD Supervising MD: Eden Emms MD, Theron Arista Indication 1: Atrial Fibrillation Indication 2: Coronary Artery Disease Lab Used: [facility] [place] Site: [address] PT 23.4 INR POC 2.06 INR RANGE 2.0-3.0  Dietary changes: no    Health status changes: no    Bleeding/hemorrhagic complications: no    Recent/future hospitalizations: no    Any changes in medication regimen? no    Recent/future dental: no  Any missed doses?: no       Is patient compliant with meds? yes       Allergies: 1)  ! Penicillin 2)  ! Amoxicillin 3)  ! * Ivp Dye 4)  ! * Clindamycin  Anticoagulation Management History:      His anticoagulation is being managed by telephone today.  Positive risk factors for bleeding include an age of 39 years or older and presence of serious comorbidities.  The bleeding index is 'intermediate risk'.  Positive CHADS2 values include History of CHF, History of HTN, and Age > 6 years old.  The start date was 09/13/2006.  His last INR was 2.73.  Prothrombin time is 23.4.  Anticoagulation responsible Karlisa Gaubert: Eden Emms MD, Theron Arista.  INR POC: 2.06.  Exp: 03/2011.    Anticoagulation Management Assessment/Plan:      The patient's current anticoagulation dose is Coumadin 5 mg tabs: Take as directed by coumadin clinic..  The target INR is 2.0-3.0.  The next INR is due 03/28/2010.  Anticoagulation instructions were given to spouse/patient.  Results were reviewed/authorized by [name], PharmD.  He was notified by [name] PharmD.         Prior Anticoagulation Instructions: INR 1.8  Labs received from Dr. Deterding's office today. Spoke with pt's wife.  Take extra 1/2 tablet today then resume same dose of 1 tablet every day except 1/2 tablet on Sunday and Thursday.  Recheck INR in 3 weeks.  [name] PharmD  February 14, 2010 3:56 PM   Current  Anticoagulation Instructions: INR 2.06  Spoke with pt's wife.  Continue same dose of 1 tablet every day except 1/2 tablet on Sunday and Thursday.

## 2010-09-08 NOTE — Cardiovascular Report (Signed)
Summary: Office Visit   Office Visit   Imported By: Roderic Ovens 10/04/2009 12:40:45  _____________________________________________________________________  External Attachment:    Type:   Image     Comment:   External Document

## 2010-09-08 NOTE — Progress Notes (Signed)
Summary: reaction to meds   Phone Note Call from Patient Call back at Home Phone [phone] Message from:  Patient on Jan 04, 2010 11:33 AM  Refills Requested: Medication #1:  y mouth two times a day. Caller: Spouse Reason for Call: Talk to Nurse Summary of Call: pt has a reaction to meds CLINDAMYCIN HCL 300 MG CAPS one .is there anything else the pt can take .  Initial call taken by: Lorne Skeens,  Jan 04, 2010 11:35 AM  Follow-up for Phone Call        per Dr Ladona Ridgel try on hold off.  Last dose was on Fri night.  Continue Benadryl and Benadryl lotion.  Call me back if not any better in the next few days Dennis Bast, RN, BSN  Jan 04, 2010 3:16 PM   New Allergies: ! * CLINDAMYCIN New Allergies: ! * CLINDAMYCIN

## 2010-09-08 NOTE — Medication Information (Signed)
[facility]: [facility]   Imported By: Lester [place] 05/17/2010 10:25:40  _____________________________________________________________________  External Attachment:    Type:   Image     Comment:   External Document

## 2010-09-08 NOTE — Medication Information (Signed)
Summary: [facility]   Anticoagulant Therapy  Managed by: [name], PharmD Referring MD: [name] PCP: [name] Dust, MD Supervising MD: [name] Squibb MD, [name] Indication 1: Atrial Fibrillation Indication 2: Coronary Artery Disease Lab Used: [facility] [place] Site: [address] PT 24.4 INR POC 2.18 INR RANGE 2.0-3.0  Dietary changes: no    Health status changes: no    Bleeding/hemorrhagic complications: no    Recent/future hospitalizations: no    Any changes in medication regimen? no    Recent/future dental: no  Any missed doses?: no       Is patient compliant with meds? yes       Allergies: 1)  ! Penicillin 2)  ! Amoxicillin 3)  ! * Ivp Dye 4)  ! * Clindamycin  Anticoagulation Management History:      His anticoagulation is being managed by telephone today.  Positive risk factors for bleeding include an age of 53 years or older and presence of serious comorbidities.  The bleeding index is 'intermediate risk'.  Positive CHADS2 values include History of CHF, History of HTN, and Age > 62 years old.  The start date was 09/13/2006.  His last INR was 1.97.  Prothrombin time is 24.4.  Anticoagulation responsible provider: Daleen Squibb MD, [name].  INR POC: 2.18.  Exp: 03/2011.    Anticoagulation Management Assessment/Plan:      The patient's current anticoagulation dose is Coumadin 5 mg tabs: Take as directed by coumadin clinic..  The target INR is 2.0-3.0.  The next INR is due 06/22/2010.  Anticoagulation instructions were given to [name].  Results were reviewed/authorized by [name], PharmD.  He was notified by [name] PharmD.         Prior Anticoagulation Instructions: INR 1.97 Change dose to 5mg s daily except 2.5mg s on Sundays. Recheck in 2 weeks.   Current Anticoagulation Instructions: INR 2.18  Spoke with pt's wife.  Continue same dose of 1 tablet every day except 1/2 tablet on Sunday.  Recheck INR in 3 weeks.

## 2010-09-08 NOTE — Medication Information (Signed)
Summary: [facility]  Anticoagulant Therapy  Managed by: Bethena Midget, RN, BSN Referring MD: Sharrell Ku PCP: Alroy Dust, MD Supervising MD: Jens Som MD, Arlys John Indication 1: Atrial Fibrillation Indication 2: Coronary Artery Disease Lab Used: [facility] [place] Site: [address] PT 35.6 INR POC 3.56 INR RANGE 2.0-3.0  Dietary changes: no    Health status changes: no    Bleeding/hemorrhagic complications: no    Recent/future hospitalizations: no    Any changes in medication regimen? no    Recent/future dental: no  Any missed doses?: yes     Details: Wife states that not sure if pt took or missed yesterday's dose.   Is patient compliant with meds? yes       Allergies: 1)  ! Penicillin 2)  ! Amoxicillin 3)  ! * Ivp Dye 4)  ! * Clindamycin  Anticoagulation Management History:      His anticoagulation is being managed by telephone today.  Positive risk factors for bleeding include an age of 75 years or older and presence of serious comorbidities.  The bleeding index is 'intermediate risk'.  Positive CHADS2 values include History of CHF, History of HTN, and Age > 38 years old.  The start date was 09/13/2006.  His last INR was 2.86.  Prothrombin time is 35.6.  Anticoagulation responsible Shashana Fullington: Jens Som MD, Arlys John.  INR POC: 3.56.    Anticoagulation Management Assessment/Plan:      The patient's current anticoagulation dose is Coumadin 5 mg tabs: Take as directed by coumadin clinic..  The target INR is 2.0-3.0.  The next INR is due 08/17/2010.  Anticoagulation instructions were given to spouse.  Results were reviewed/authorized by Bethena Midget, RN, BSN.  He was notified by Bethena Midget, RN, BSN.         Prior Anticoagulation Instructions: INR 2.86  Spoke with pt's wife. Continue 5mg s daily except 7.5mg s on Mondays. Recheck in 2 weeks.   [name] PharmD  July 26, 2010 10:49 AM   Current Anticoagulation Instructions: INR 3.56 Today and Saturday take 2.5mg s then  change dose to 5mg  everyday. Recheck in 2 weeks.

## 2010-09-08 NOTE — Letter (Signed)
Summary: [facility]   Imported By: Sherian Rein 12/06/2009 08:20:38  _____________________________________________________________________  External Attachment:    Type:   Image     Comment:   External Document

## 2010-09-08 NOTE — Assessment & Plan Note (Signed)
Summary: 6+ months/apc   Primary Care Provider:  Alroy Dust, MD  CC:  9 month ROV & review of mult medical issues....  History of Present Illness: 77 y/o WM here for a follow up visit... he has mult medical problems as noted below...    ~  Dec09:  since I saw him last 6/09 he had a follow ups w/ DrDeterding for Nephrology- his note of Nov09 & labs are reviewed, no changes made... he also saw DrTaylor Jul09- defibrillator checked & OK, no changes made... he had the seasonal flu vaccine but they refuse the H1N1 shot...   ~  January 21, 2009:  states that he is doing OK- he & wife continue to see Chiropractor ever 6wk for Rx... he remains on Coumadin w/ Protimes by lab in [place] called to [facility]...      ** had GI f/u DrStark 3/10- reflux w/ LER, cough, hoarse;  Rx'd Nexium & antireflux reg- improved.      ** had f/u Nephrology DrDeterding 3/10- CKD w/ Creat= 1.9; RTA on Bicarb 2Bid w/ HCO3= 27; secondary hyperpara on Calcitriol 68mcg/d w/ Ca= 8.5, Phos= 3.9 & PTH= 56; Hg norm= 13.7 & Fe= 117.      ** had f/u Ortho DrDuda 4/10- insensate neuropathy w/ onychomycosis nails...      ** had f/u Urology DrDavis- we do not have any notes, but pt requests PSA sent to him...   ~  October 14, 2009:  he maintains regular f/u w/ DrDeterding- note of 12/10 reviewed: CKD w/ Creat 1.9 to 2.3, & RTA on Bicarb tabs... he adjusts diuretics based on his wt, no changes made... he saw DrTaylor 2/11 for f/u ischemic cardiomyopathy, hx VT w/ implanted defib, AFib on Amio... ICD battery may be near end of life & wife very upset about conversation regarding poss not replacing the unit due to comorbidities etc... for his part MrDaniels has no complaints- wife indicates local care in [place] from DrProchnau.    Current Problem List:  he has a complex medication regimen administered by his wife---  HYPERTENSION (ICD-401.9) - controlled on Rx w/ COREG 6.25Bid, HYDRALAZINE 10mg  taking 2tabsTid, ISOSORBIDE 10mg Tid,  LASIX 40mg /d, + AMIODARONE 200mg - 1/2 tab daily... BP= 132/70 and tolerating meds well...  denies HA, visual changes, CP, palipit, dizziness, syncope, dyspnea, edema, etc...   CORONARY ARTERY DISEASE (ICD-414.00) - S/P CABG x8 in 1998 by DrWilson for 3vessel CAD... followed by DrJoe for yrs, now Print production planner... he gets his defib checked remotely on a regular basis...  ~  last cath 2001 showed 50%LMain, native 3 vessel dis, grafts OK, EF= 45%...  ~  last 2DEcho 8/07 showed sl dil LV w/ EF= 20%, HK & AK diffusely, dil LA, mild AI, trivMR...  ~  last adm by Cards 8/08 for CP felt to be non-cardiac...  ISCHEMIC CARDIOMYOPATHY (ICD-414.8) - he had defibrillator placed 2003 for VT... he is also on COUMADIN followed in the [facility]... last saw DrTaylor in 7/09- stable... last 2DEcho 8/07 w/ HK & AK, EF=20% (worse than 2006)...  ~  adm 3/07 by Cards w/ decomp CHF & he was diuresed w/ help of Nephrology.  ~  labs 6/10 showed BNP= 233  Hx of ATRIAL FLUTTER (ICD-427.32) - on Amio + Coumadin... S/P AICD 12/03...   PERIPHERAL VASCULAR DISEASE (ICD-443.9) - known mild carotid dis w/ 0-39% bilat, and mod right vertebral stenosis on CDoppler 12/08... normal Ao & iliacs 4/05 dopplers...  HYPERCHOLESTEROLEMIA (ICD-272.0) - on diet alone +  FISH OIL ("per DrHecker- it's good for the eyes")...   ~  FLP 8/08 showed TChol 171, TG 150, HDL 51, LDL 90  ~  FLP 6/10 showed TChol 180, TG 180, HDL 52, LDL 92... continue fish oil, better low fat diet.  ~  FLP 3/11 showed TChol 190, TG 199, HDL 61, LDL 89  HYPOTHYROIDISM (ICD-244.9) - on LEVOTHYROID 144mcg/d...  ~  lab 8/08 w/ TSH= 1.51  ~  lab 6/10 showed TSH= 1.18... continue same.  ~  lab 3/11 showed TSH= 1.46  Hx of ISCHEMIC COLITIS (ICD-557.9) - occured after his CABG in 1998... resulted in total colectomy and ileostomy... long post-op course w/ grafted abd wound etc... ileostomy working well- no complaints...  RENAL INSUFFICIENCY, CHRONIC (ICD-585.9) - followed  by DrDeterding Q4-39months w/ labs etc... on CALCITRIOL 0.5mg /d and BICARB 2tabsBid for a RTA...  ~  labs from Nephrology 11/09 showed BUN= 50, Creat= 2.1  ~  labs from Nephrology 3/10 showed BUN= 34, Creat= 1.92, HCO3= 27  ~  labs 3/11 showed BUN= 35, Creat= 2.2  Hx of PROSTATE CANCER (ICD-185) & BENIGN PROSTATIC HYPERTROPHY, WITH OBSTRUCTION (ICD-600.01) - followed by DrRDavis on FLOMAX 0.4mg /d...  History of prostate cancer in 2003 w/ XRT by DrGoodchild...  ~  pt indicates that DrDavis follows his PSA's...  ~  labs here 6/10 showed PSA=  3.74  ~  labs here 3/11 showed PSA= 5.46... copy to DrDavis.  DEGENERATIVE JOINT DISEASE, GENERALIZED (ICD-715.00) - he takes DCN100 Prn for pain...  DEMENTIA, MILD (ICD-294.8) - on ARICEPT 10mg /d...  ANXIETY (ICD-300.00) - on AMITRIPTYLINE 25mg Qhs for sleep, and ALPRAZOLAM 0.5mg  Prn for nerves...   Allergies: 1)  ! Penicillin 2)  ! Amoxicillin 3)  ! * Ivp Dye  Comments:  Nurse/Medical Assistant: The patient's medications and allergies were reviewed with the patient and were updated in the Medication and Allergy Lists.  Past History:  Past Medical History:  OTHER SYMPTOMS INVOLVING HEAD AND NECK (ICD-784.99) COUMADIN THERAPY (ICD-V58.61) ANEMIA (ICD-285.9) HOARSENESS (ICD-784.49) COUGH (ICD-786.2) HYPERTENSION (ICD-401.9) CORONARY ARTERY DISEASE (ICD-414.00) ISCHEMIC CARDIOMYOPATHY (ICD-414.8) Hx of ATRIAL FLUTTER (ICD-427.32) PERIPHERAL VASCULAR DISEASE (ICD-443.9) HYPERCHOLESTEROLEMIA (ICD-272.0) HYPOTHYROIDISM (ICD-244.9) Hx of ISCHEMIC COLITIS (ICD-557.9) RENAL INSUFFICIENCY, CHRONIC (ICD-585.9) BENIGN PROSTATIC HYPERTROPHY, WITH OBSTRUCTION (ICD-600.01) Hx of PROSTATE CANCER (ICD-185) DEGENERATIVE JOINT DISEASE, GENERALIZED (ICD-715.00) DEMENTIA, MILD (ICD-294.8) ANXIETY (ICD-300.00)  Past Surgical History: S/P CABG 1998 S/P total colectomy & ileostomy for ischemic colitis 1998  Family History: Reviewed history from  10/08/2008 and no changes required. Family History of Colon Cancer:Brother Family History of Esophageal Cancer:Brother Family History of Diabetes: Mother Family History of Heart Disease: Father  Social History: Reviewed history from 10/08/2008 and no changes required. Occupation: Retired [name] a former smoker.  Alcohol Use - no Illicit Drug Use - no married to peggy Huttner  Review of Systems      See HPI       The patient complains of decreased hearing, dyspnea on exertion, peripheral edema, muscle weakness, and difficulty walking.  The patient denies anorexia, fever, weight loss, weight gain, vision loss, hoarseness, chest pain, syncope, prolonged cough, headaches, hemoptysis, abdominal pain, melena, hematochezia, severe indigestion/heartburn, hematuria, incontinence, suspicious skin lesions, transient blindness, depression, unusual weight change, abnormal bleeding, enlarged lymph nodes, and angioedema.    Vital Signs:  Patient profile:   77 year old male Height:      66 inches Weight:      199.50 pounds BMI:     32.32 O2 Sat:  98 % on Room air Temp:     97.3 degrees F oral Pulse rate:   60 / minute BP sitting:   136 / 72  (left arm) Cuff size:   regular  Vitals Entered By: Randell Loop CMA (October 14, 2009 9:26 AM)  O2 Sat at Rest %:  98 O2 Flow:  Room air CC: 9 month ROV & review of mult medical issues... Is Patient Diabetic? No Pain Assessment Patient in pain? no      Comments meds updated today   Physical Exam  Additional Exam:  WD, Overweight, 77 y/o WM in NAD... GENERAL:  Alert, pleasant & cooperative... HEENT:  [place]/AT, EOM-wnl, PERRLA, EACs-clear, TMs-wnl, NOSE-clear, THROAT-clear & wnl, sl dry MM's... NECK:  Supple w/ fairROM; no JVD; normal carotid impulses w/o bruits; no thyromegaly or nodules palpated; no lymphadenopathy. CHEST:  Clear to P & A; without wheezes/ rales/ or rhonchi heard... HEART:  Regular Rhythm, gr 1-2/6 SEM S4 gallop, no rubs  or S3... ABDOMEN:  Ileotomy in right side- functioning normally, large abd wound/ graft/ defect- w/ binder... no organomegaly or masses. EXT: without deformities, mild arthritic changes; no varicose veins/ +venous insuffic/ tr edema. NEURO:  CN's intact; periph neuropathy bilat w/o focal deficits... DERM:  No lesions noted; no rash etc...    MISC. Report  Procedure date:  10/14/2009  Findings:      Lipid Panel (LIPID)   Cholesterol               190 mg/dL                   4-098   Triglycerides        [H]  199.0 mg/dL                 1.1-914.7   HDL                       82.95 mg/dL                 >62.13   LDL Cholesterol           89 mg/dL                    0-86  BMP (METABOL)   Sodium                    142 mEq/L                   135-145   Potassium                 4.4 mEq/L                   3.5-5.1   Chloride                  106 mEq/L                   96-112   Carbon Dioxide            31 mEq/L                    19-32   Glucose                   92 mg/dL                    57-84   BUN                  [  H]  35 mg/dL                    1-61   Creatinine           [H]  2.2 mg/dL                   0.9-6.0   Calcium                   9.3 mg/dL                   4.5-40.9   GFR                       30.56 mL/min                >60  Hepatic/Liver Function Panel (HEPATIC)   Total Bilirubin           0.8 mg/dL                   8.1-1.9   Direct Bilirubin          0.2 mg/dL                   1.4-7.8   Alkaline Phosphatase      43 U/L                      39-117   AST                       29 U/L                      0-37   ALT                       23 U/L                      0-53   Total Protein             8.1 g/dL                    2.9-5.6   Albumin                   3.9 g/dL                    2.1-3.0  Comments:      CBC Platelet w/Diff (CBCD)   White Cell Count          6.5 K/uL                    4.5-10.5   Red Cell Count            4.44 Mil/uL                  4.22-5.81   Hemoglobin                14.9 g/dL                   86.5-78.4   Hematocrit                43.6 %                      39.0-52.0   MCV  98.2 fl                     78.0-100.0   Platelet Count            168.0 K/uL                  150.0-400.0   Neutrophil %              53.7 %                      43.0-77.0   Lymphocyte %              30.8 %                      12.0-46.0   Monocyte %                10.1 %                      3.0-12.0   Eosinophils%              3.3 %                       0.0-5.0   Basophils %               2.1 %                       0.0-3.0  TSH (TSH)   FastTSH                   1.46 uIU/mL                 0.35-5.50  Prostate Specific Antigen (PSA)   PSA-Hyb              [H]  5.46 ng/mL                  0.10-4.00   Impression & Recommendations:  Problem # 1:  HYPERTENSION (ICD-401.9) Controlled on meds-  continue the same. His updated medication list for this problem includes:    Coreg 6.25 Mg Tabs (Carvedilol) .Marland Kitchen... Take one tablet by mouth twice daily.    Hydralazine Hcl 10 Mg Tabs (Hydralazine hcl) .Marland Kitchen... Take 2 tablets by mouth every 8 hours    Furosemide 40 Mg Tabs (Furosemide) .Marland Kitchen... Take 1 tablet two times a day  Orders: TLB-Lipid Panel (80061-LIPID) TLB-BMP (Basic Metabolic Panel-BMET) (80048-METABOL) TLB-Hepatic/Liver Function Pnl (80076-HEPATIC) TLB-CBC Platelet - w/Differential (85025-CBCD) TLB-TSH (Thyroid Stimulating Hormone) (84443-TSH) TLB-PSA (Prostate Specific Antigen) (84153-PSA)  Problem # 2:  CORONARY ARTERY DISEASE (ICD-414.00) Followed by DrTaylor & his notes reviewed... pt w/o angina complaints. His updated medication list for this problem includes:    Isosorbide Dinitrate 10 Mg Tabs (Isosorbide dinitrate) .Marland Kitchen... Take 1 tablet by mouth three times a day as directed...    Coreg 6.25 Mg Tabs (Carvedilol) .Marland Kitchen... Take one tablet by mouth twice daily.    Hydralazine Hcl 10 Mg Tabs (Hydralazine hcl) .Marland Kitchen... Take  2 tablets by mouth every 8 hours    Pacerone 200 Mg Tabs (Amiodarone hcl) .Marland Kitchen... Take 1/2 tab by mouth once daily or as directed    Furosemide 40 Mg Tabs (Furosemide) .Marland Kitchen... Take 1 tablet two times a day    Nitroglycerin 0.4 Mg Subl (Nitroglycerin) ..... One tablet under tongue every 5 minutes as needed  for chest pain---may repeat times three  Problem # 3:  ISCHEMIC CARDIOMYOPATHY (ICD-414.8) His ICD is approaching end of life per family discussion... they want whatever it takes to keep him going... His updated medication list for this problem includes:    Isosorbide Dinitrate 10 Mg Tabs (Isosorbide dinitrate) .Marland Kitchen... Take 1 tablet by mouth three times a day as directed...    Coreg 6.25 Mg Tabs (Carvedilol) .Marland Kitchen... Take one tablet by mouth twice daily.    Hydralazine Hcl 10 Mg Tabs (Hydralazine hcl) .Marland Kitchen... Take 2 tablets by mouth every 8 hours    Pacerone 200 Mg Tabs (Amiodarone hcl) .Marland Kitchen... Take 1/2 tab by mouth once daily or as directed    Furosemide 40 Mg Tabs (Furosemide) .Marland Kitchen... Take 1 tablet two times a day    Nitroglycerin 0.4 Mg Subl (Nitroglycerin) ..... One tablet under tongue every 5 minutes as needed for chest pain---may repeat times three  Problem # 4:  HYPERCHOLESTEROLEMIA (ICD-272.0) On FishOil + diet...  Problem # 5:  HYPOTHYROIDISM (ICD-244.9) Stable-  continue same dose. His updated medication list for this problem includes:    Levothyroxine Sodium 100 Mcg Tabs (Levothyroxine sodium) .Marland Kitchen... Take 1 tab by mouth once daily...  Problem # 6:  RENAL INSUFFICIENCY, CHRONIC (ICD-585.9) Followed by DrDeterding w/ copy labs to him...  Problem # 7:  Hx of PROSTATE CANCER (ICD-185) Rising PSA as noted>> copy to DrDavis.  Problem # 8:  DEGENERATIVE JOINT DISEASE, GENERALIZED (ICD-715.00) He has mod DJD but doesn't complain about specific pain etc...  Problem # 9:  OTHER MEDICAL PROBLEMS AS NOTED>>>  Complete Medication List: 1)  Coumadin 5 Mg Tabs (Warfarin sodium) .... Take as directed  by coumadin clinic. 2)  Isosorbide Dinitrate 10 Mg Tabs (Isosorbide dinitrate) .... Take 1 tablet by mouth three times a day as directed... 3)  Coreg 6.25 Mg Tabs (Carvedilol) .... Take one tablet by mouth twice daily. 4)  Hydralazine Hcl 10 Mg Tabs (Hydralazine hcl) .... Take 2 tablets by mouth every 8 hours 5)  Pacerone 200 Mg Tabs (Amiodarone hcl) .... Take 1/2 tab by mouth once daily or as directed 6)  Furosemide 40 Mg Tabs (Furosemide) .... Take 1 tablet two times a day 7)  Fish Oil 1000 Mg Caps (Omega-3 fatty acids) .... Once daily 8)  Levothyroxine Sodium 100 Mcg Tabs (Levothyroxine sodium) .... Take 1 tab by mouth once daily.Marland KitchenMarland Kitchen 9)  Omeprazole 40 Mg Cpdr (Omeprazole) .... One tablet by mouth once daily 10)  Flomax 0.4 Mg Cp24 (Tamsulosin hcl) .... Take 1 tab by mouth at bedtime as directed by drdavis... 11)  Calcitriol 0.5 Mcg Caps (Calcitriol) .Marland Kitchen.. 1 tab daily as diercted by drdeterding... 12)  Sodium Bicarbonate 648 Mg Tabs (Sodium bicarbonate (antacid)) .... Take 2 two tabs twice daily as directed by drdeterding... 13)  Aricept 10 Mg Tabs (Donepezil hcl) .... Take 1 tablet by mouth once a day 14)  Amitriptyline Hcl 25 Mg Tabs (Amitriptyline hcl) .... Take one tablet at bedtime 15)  Alprazolam 0.5 Mg Tabs (Alprazolam) .... As needed 16)  Tussionex Pennkinetic Er 8-10 Mg/51ml Lqcr (Chlorpheniramine-hydrocodone) .... Take 1 teaspoon every  12 hours as needed for cough 17)  Magic Mouthwash  .... 1 tsp gargle and swallow up to 4 times daily as needed for throat symptoms... 18)  Multivitamins Tabs (Multiple vitamin) .... Once daily 19)  Nitroglycerin 0.4 Mg Subl (Nitroglycerin) .... One tablet under tongue every 5 minutes as needed for chest pain---may repeat times three 20)  Mucinex 600  Mg Xr12h-tab (Guaifenesin) .... As needed  Patient Instructions: 1)  Today we updated your med list- see below.... 2)  Continue your current meds the same... 3)  Today we did your FASTING blood work...  please call the "phone tree" in a few days for your lab results.Marland KitchenMarland Kitchen 4)  We will send copies to [facility] & DrDeterding... 5)  Call for any questions.Marland KitchenMarland Kitchen 6)  Please schedule a follow-up appointment in 6 months.

## 2010-09-08 NOTE — Medication Information (Signed)
Summary: [facility]  Anticoagulant Therapy  Managed by: Bethena Midget, RN, BSN Referring MD: Sharrell Ku PCP: Alroy Dust, MD Supervising MD: Antoine Poche MD, Fayrene Fearing Indication 1: Coronary Artery Disease (ICD-CAD) Lab Used: [facility] [place] Site: [address] PT 26.5 INR POC 2.46 INR RANGE 2.0-3.0  Dietary changes: no    Health status changes: no    Bleeding/hemorrhagic complications: no    Recent/future hospitalizations: no    Any changes in medication regimen? no    Recent/future dental: no  Any missed doses?: no       Is patient compliant with meds? yes       Allergies: 1)  ! Penicillin 2)  ! Amoxicillin 3)  ! * Ivp Dye  Anticoagulation Management History:      His anticoagulation is being managed by telephone today.  Positive risk factors for bleeding include an age of 75 years or older and presence of serious comorbidities.  The bleeding index is 'intermediate risk'.  Positive CHADS2 values include History of HTN and Age > 32 years old.  The start date was 09/13/2006.  His last INR was 2.46.  Prothrombin time is 26.5.  Anticoagulation responsible provider: Antoine Poche MD, Fayrene Fearing.  INR POC: 2.46.    Anticoagulation Management Assessment/Plan:      The patient's current anticoagulation dose is Coumadin 5 mg tabs: Take as directed by coumadin clinic..  The target INR is 2.0-3.0.  The next INR is due 09/07/2009.  Anticoagulation instructions were given to spouse.  Results were reviewed/authorized by Bethena Midget, RN, BSN.  He was notified by Bethena Midget, RN, BSN.         Prior Anticoagulation Instructions: INR 3.12  Attempted to contact pt's wife with results.  LMOM with results. Cloyde Reams RN  July 14, 2009 4:40 PM  Called spoke with pt's wife, advised to have pt take 1/2 tablet x 2 days then resume same dosage 1 tablet daily except 1/2 tablet on Sundays, Tuesdays, and Thursdays.  Recheck in 4 weeks.      Current Anticoagulation Instructions: INR  2.46 Spoke with wife, dose instructions given and she is aware to recheck in 4 weeks.

## 2010-09-08 NOTE — Medication Information (Signed)
Summary: [facility] Clinic  Anticoagulant Therapy  Managed by: Bethena Midget, RN, BSN Referring MD: Sharrell Ku PCP: Alroy Dust, MD Supervising MD: Jens Som MD, Arlys John Indication 1: Coronary Artery Disease (ICD-CAD) Lab Used: [facility] [place] Site: [address] PT 21.6 INR POC 1.90 INR RANGE 2.0-3.0  Dietary changes: no    Health status changes: no    Bleeding/hemorrhagic complications: no    Recent/future hospitalizations: no    Any changes in medication regimen? no    Recent/future dental: no  Any missed doses?: yes     Details: missed last Thurs dose.   Is patient compliant with meds? yes       Allergies: 1)  ! Penicillin 2)  ! Amoxicillin 3)  ! * Ivp Dye  Anticoagulation Management History:      His anticoagulation is being managed by telephone today.  Positive risk factors for bleeding include an age of 75 years or older and presence of serious comorbidities.  The bleeding index is 'intermediate risk'.  Positive CHADS2 values include History of HTN and Age > 75 years old.  The start date was 09/13/2006.  His last INR was 2.51.  Prothrombin time is 21.6.  Anticoagulation responsible provider: Jens Som MD, Arlys John.  INR POC: 1.90.    Anticoagulation Management Assessment/Plan:      The patient's current anticoagulation dose is Coumadin 5 mg tabs: Take as directed by coumadin clinic..  The target INR is 2.0-3.0.  The next INR is due 11/02/2009.  Anticoagulation instructions were given to spouse.  Results were reviewed/authorized by Bethena Midget, RN, BSN.  He was notified by Bethena Midget, RN, BSN.         Prior Anticoagulation Instructions: INR 2.51  Continue 0.5 tab each Sunday, Tuesday, Thursday and 1 tab on all other days.  Recheck in 4 weeks.  D/W Mrs. Garner Nash at 1002 on 09/13/2009.    Current Anticoagulation Instructions: INR 1.90 Today take 5mg s then continue 5mg  daily except 2.5mg s TT&Sun. Recheck in 4 weeks. Spoke with wife and she is aware of dose and  recheck inr.

## 2010-09-08 NOTE — Progress Notes (Signed)
  Phone Note Call from Patient Call back at from Wife   Caller: Spouse Details for Reason: Pending INR check Summary of Call: Spouse called to inform us that pt had allergic reaction to Clindamycin last week and due to rash all over she states that she doesn't want to take him out due to high temperatures. Encouraged her to continue using Benadryl by mouth and Topical that she's using for him and attempted to get INR on Friday AM before it gets too hot outside. Importance of INR check explained.  Initial call taken by: Bethena Midget, RN, BSN,  January 05, 2010 2:25 PM

## 2010-09-08 NOTE — Progress Notes (Signed)
Summary: merck  question/cb  Phone Note From Pharmacy   Caller: [phone]  Call For: nadel  Summary of Call: [phone] ref # this is about the amitriptilyn and its dosing  Initial call taken by: Lacinda Axon,  May 24, 2010 9:55 AM  Follow-up for Phone Call        called and spoke with pharmacist, Ed Buzzy Han.  Ed wanted to verify this isn't a new med for pt given his age and possible s/e of the med.  Informed Ed, per our records it shows pt has been on this med since at least 2008.  Nothing further needed.  Aundra Millet Reynolds LPN  May 24, 2010 11:19 AM

## 2010-09-08 NOTE — Assessment & Plan Note (Signed)
Summary: DEVCIE/SAF  Medications Added FUROSEMIDE 40 MG  TABS (FUROSEMIDE) Take 2 in am 1 in pm CALCITRIOL 0.5 MCG  CAPS (CALCITRIOL) 2 tablets bid DrDeterding...      Allergies Added:   Visit Type:  Follow-up Primary Provider:  Alroy Dust, MD   History of Present Illness: Mr. Mccamish returns today for ICD followup.  He is a pleasant elderly man with a h/o VT, atrial fibrillation,  and an ICM s/p ICD implant years ago.  He denies c/p or worsening sob. His CHF is class 2.  He has mild peripheral edema.  He has had no intercurrent ICD therapy.  He has reached ERI.   Current Medications (verified): 1)  Coumadin 5 Mg Tabs (Warfarin Sodium) .... Take As Directed By Coumadin Clinic. 2)  Isosorbide Dinitrate 10 Mg  Tabs (Isosorbide Dinitrate) .... Take 1 Tablet By Mouth Three Times A Day As Directed... 3)  Coreg 6.25 Mg  Tabs (Carvedilol) .... Take One Tablet By Mouth Twice Daily. 4)  Hydralazine Hcl 10 Mg  Tabs (Hydralazine Hcl) .... Take 2 Tablets By Mouth Every 8 Hours 5)  Pacerone 200 Mg Tabs (Amiodarone Hcl) .... Take 1/2 Tab By Mouth Once Daily or As Directed 6)  Furosemide 40 Mg  Tabs (Furosemide) .... Take 2 in Am 1 in Pm 7)  Fish Oil 1000 Mg Caps (Omega-3 Fatty Acids) .... Once Daily 8)  Levothyroxine Sodium 100 Mcg Tabs (Levothyroxine Sodium) .... Take 1 Tab By Mouth Once Daily.Marland KitchenMarland Kitchen 9)  Omeprazole 40 Mg Cpdr (Omeprazole) .... One Tablet By Mouth Once Daily 10)  Flomax 0.4 Mg  Cp24 (Tamsulosin Hcl) .... Take 1 Tab By Mouth At Bedtime As Directed By EAVWUJW... 11)  Calcitriol 0.5 Mcg  Caps (Calcitriol) .... 2 Tablets Bid Drdeterding... 12)  Sodium Bicarbonate 648 Mg  Tabs (Sodium Bicarbonate (Antacid)) .... Take 2 Two Tabs Twice Daily As Directed By Drdeterding... 13)  Aricept 10 Mg  Tabs (Donepezil Hcl) .... Take 1 Tablet By Mouth Once A Day 14)  Amitriptyline Hcl 25 Mg  Tabs (Amitriptyline Hcl) .... Take One Tablet At Bedtime 15)  Alprazolam 0.5 Mg  Tabs (Alprazolam) .... Take 1  Tablet By Mouth Three Times A Day As Needed Anxiety 16)  Tussionex Pennkinetic Er 8-10 Mg/58ml Lqcr (Chlorpheniramine-Hydrocodone) .... Take 1 Teaspoon Every  12 Hours As Needed For Cough 17)  Magic Mouthwash .... 1 Tsp Gargle and Swallow Up To 4 Times Daily As Needed For Throat Symptoms... 18)  Multivitamins  Tabs (Multiple Vitamin) .... Once Daily 19)  Nitroglycerin 0.4 Mg Subl (Nitroglycerin) .... One Tablet Under Tongue Every 5 Minutes As Needed For Chest Pain---May Repeat Times Three 20)  Mucinex 600 Mg Xr12h-Tab (Guaifenesin) .... As Needed  Allergies (verified): 1)  ! Penicillin 2)  ! Amoxicillin 3)  ! * Ivp Dye  Past History:  Past Medical History: Last updated: 10/14/2009  OTHER SYMPTOMS INVOLVING HEAD AND NECK (ICD-784.99) COUMADIN THERAPY (ICD-V58.61) ANEMIA (ICD-285.9) HOARSENESS (ICD-784.49) COUGH (ICD-786.2) HYPERTENSION (ICD-401.9) CORONARY ARTERY DISEASE (ICD-414.00) ISCHEMIC CARDIOMYOPATHY (ICD-414.8) Hx of ATRIAL FLUTTER (ICD-427.32) PERIPHERAL VASCULAR DISEASE (ICD-443.9) HYPERCHOLESTEROLEMIA (ICD-272.0) HYPOTHYROIDISM (ICD-244.9) Hx of ISCHEMIC COLITIS (ICD-557.9) RENAL INSUFFICIENCY, CHRONIC (ICD-585.9) BENIGN PROSTATIC HYPERTROPHY, WITH OBSTRUCTION (ICD-600.01) Hx of PROSTATE CANCER (ICD-185) DEGENERATIVE JOINT DISEASE, GENERALIZED (ICD-715.00) DEMENTIA, MILD (ICD-294.8) ANXIETY (ICD-300.00)  Past Surgical History: Last updated: 10/14/2009 S/P CABG 1998 S/P total colectomy & ileostomy for ischemic colitis 1998  Review of Systems  The patient denies chest pain, syncope, dyspnea on exertion, and peripheral edema.  Vital Signs:  Patient profile:   77 year old male Height:      66 inches Weight:      195 pounds BMI:     31.59 Pulse rate:   54 / minute BP sitting:   120 / 68  (right arm)  Vitals Entered By: Laurance Flatten CMA (Dec 21, 2009 9:46 AM)   Physical Exam  General:  Well developed, well nourished, in no acute distress. Head:   normocephalic and atraumatic Eyes:  PERRLA/EOM intact; conjunctiva and lids normal. Mouth:  Teeth, gums and palate normal. Oral mucosa normal. Neck:  Neck supple, no JVD. No masses, thyromegaly or abnormal cervical nodes. Chest Wall:  Well healed ICD incision. Lungs:  Clear bilaterally with no wheezes, rales, or rhonchi or increased work of breathing. Heart:  RRR with normal S1 and S2.  PMI is enlarged and laterally displaced.  No murmurs. Abdomen:  Obese, NTNT with out rebound or guarding. Msk:  Back normal, normal gait. Muscle strength and tone normal. Pulses:  pulses normal in all 4 extremities Extremities:  No clubbing or cyanosis.  1+ peripheral edema. Neurologic:  Alert and oriented x 3.    ICD Specifications Following MD:  Lewayne Bunting, MD     ICD Vendor:  [employer] [facility] [employer]     ICD Model Number:  [id number]     ICD Serial Number:  [id number] ICD DOI:  07/22/2002     ICD Implanting MD:  Lewayne Bunting, MD  Lead 1:    Location: [facility]     DOI: 07/22/2002     Model #: [id number]     Serial #: [id number]     Status: active Lead 2:    Location: [facility]     DOI: 07/22/2002     Model #: [id number]     Serial #: [id number]     Status: active  Indications::  ICM, CHF   ICD Follow Up Remote Check?  No Battery Voltage:  2.29 V     Charge Time:  18.4 seconds     Battery Est. Longevity:  ERI ICD Dependent:  No      Episodes Coumadin:  Yes  Brady Parameters Mode VVI     Lower Rate Limit:  40      Tachy Zones VF:  200     VT:  160     Tech Comments:  ERI 09/09/09.   Altha Harm, LPN  Dec 21, 2009 9:53 AM  MD Comments:  Agree with above.  Impression & Recommendations:  Problem # 1:  AUTOMATIC IMPLANTABLE CARDIAC DEFIBRILLATOR SITU (ICD-V45.02) His device has reached ERI.  I will schedule ICD generator change in the next few weeks.  Problem # 2:  ATRIAL FIBRILLATION (ICD-427.31)  His ventricular rate appears to be well controlled.  Continue current meds. His updated medication list for this problem includes:    Coumadin  5 Mg Tabs (Warfarin sodium) .Marland Kitchen... Take as directed by coumadin clinic.    Coreg 6.25 Mg Tabs (Carvedilol) .Marland Kitchen... Take one tablet by mouth twice daily.    Pacerone 200 Mg Tabs (Amiodarone hcl) .Marland Kitchen... Take 1/2 tab by mouth once daily or as directed  Orders: TLB-BMP (Basic Metabolic Panel-BMET) (80048-METABOL) TLB-CBC Platelet - w/Differential (85025-CBCD) TLB-PT (Protime) (85610-PTP) TLB-PTT (85730-PTTL)  Problem # 3:  CHRONIC SYSTOLIC HEART FAILURE (ICD-428.22) His CHF is class 2.  I discussed the treatment options with the family.  I do not think a BiV upgrade is likely to help him.  He will continue his  current meds. His updated medication list for this problem includes:    Coumadin 5 Mg Tabs (Warfarin sodium) .Marland Kitchen... Take as directed by coumadin clinic.    Isosorbide Dinitrate 10 Mg Tabs (Isosorbide dinitrate) .Marland Kitchen... Take 1 tablet by mouth three times a day as directed...    Coreg 6.25 Mg Tabs (Carvedilol) .Marland Kitchen... Take one tablet by mouth twice daily.    Pacerone 200 Mg Tabs (Amiodarone hcl) .Marland Kitchen... Take 1/2 tab by mouth once daily or as directed    Furosemide 40 Mg Tabs (Furosemide) .Marland Kitchen... Take 2 in am 1 in pm    Nitroglycerin 0.4 Mg Subl (Nitroglycerin) ..... One tablet under tongue every 5 minutes as needed for chest pain---may repeat times three  Patient Instructions: 1)  Your physician recommends that you return for lab work today 2)  Set up for ICD generator change

## 2010-09-08 NOTE — Letter (Signed)
Summary: [facility]  [facility]   Imported By: Lester Morningside 06/17/2010 10:06:32  _____________________________________________________________________  External Attachment:    Type:   Image     Comment:   External Document

## 2010-09-08 NOTE — Medication Information (Signed)
Summary: Coumadin Clinic  Anticoagulant Therapy  Managed by: Bethena Midget, RN, BSN Referring MD: Sharrell Ku PCP: Alroy Dust, MD Supervising MD: Clifton Colburn MD, Cristal Deer Indication 1: Atrial Fibrillation Indication 2: Coronary Artery Disease Lab Used: [facility] Site: [address] PT 22.1 INR POC 1.92 INR RANGE 2.0-3.0  Dietary changes: no    Health status changes: no    Bleeding/hemorrhagic complications: no    Recent/future hospitalizations: no    Any changes in medication regimen? no    Recent/future dental: no  Any missed doses?: no       Is patient compliant with meds? yes       Allergies: 1)  ! Penicillin 2)  ! Amoxicillin 3)  ! * Ivp Dye 4)  ! * Clindamycin  Anticoagulation Management History:      His anticoagulation is being managed by telephone today.  Positive risk factors for bleeding include an age of 75 years or older and presence of serious comorbidities.  The bleeding index is 'intermediate risk'.  Positive CHADS2 values include History of CHF, History of HTN, and Age > 78 years old.  The start date was 09/13/2006.  His last INR was 2.18.  Prothrombin time is 22.1.  Anticoagulation responsible provider: Clifton Boyce MD, Cristal Deer.  INR POC: 1.92.    Anticoagulation Management Assessment/Plan:      The patient's current anticoagulation dose is Coumadin 5 mg tabs: Take as directed by coumadin clinic..  The target INR is 2.0-3.0.  The next INR is due 07/06/2010.  Anticoagulation instructions were given to spouse.  Results were reviewed/authorized by Bethena Midget, RN, BSN.  He was notified by Bethena Midget, RN, BSN.         Prior Anticoagulation Instructions: INR 2.18  Spoke with pt's wife.  Continue same dose of 1 tablet every day except 1/2 tablet on Sunday.  Recheck INR in 3 weeks.   Current Anticoagulation Instructions: INR 1.92 Change dose to 5mg s daily. Recheck in 2 weeks.

## 2010-09-08 NOTE — Progress Notes (Signed)
Summary: dose pt needs antibiotic  Medications Added CLINDAMYCIN HCL 300 MG CAPS (CLINDAMYCIN HCL) one by mouth two times a day       Phone Note Call from Patient Call back at Home Phone [phone]   Caller: Spouse Reason for Call: Talk to Nurse, Talk to Doctor Summary of Call: pt was suppose to go home on an antibiotic but no Rx was given at d/c and nothing was called to Pharm so they where wondering should he be on it and if so call into [facility] in [phone] Initial call taken by: Omer Jack,  Dec 29, 2009 12:19 PM  Follow-up for Phone Call        spoke with Dr Ladona Ridgel pt needs Clindamycin 300mg  two times a day for 5 days.  pt's wife aware Dennis Bast, RN, BSN  Dec 29, 2009 3:18 PM    New/Updated Medications: CLINDAMYCIN HCL 300 MG CAPS (CLINDAMYCIN HCL) one by mouth two times a day Prescriptions: CLINDAMYCIN HCL 300 MG CAPS (CLINDAMYCIN HCL) one by mouth two times a day  #10 x 0   Entered by:   Dennis Bast, RN, BSN   Authorized by:   Laren Boom, MD, [facility]   Signed by:   Dennis Bast, RN, BSN on 12/29/2009   Method used:   Electronically to        [facility]  [address] (retail)       [address]       [place], [place]  [address]       Ph: [phone]       Fax: [phone]   RxID:   [id number]

## 2010-09-08 NOTE — Assessment & Plan Note (Signed)
Summary: defib check.gdt.amber  Medications Added COREG 6.25 MG  TABS (CARVEDILOL) Take one tablet by mouth twice daily. HYDRALAZINE HCL 10 MG  TABS (HYDRALAZINE HCL) take 2 tablets by mouth every 8 hours FUROSEMIDE 40 MG  TABS (FUROSEMIDE) Take 1 tablet two times a day VITAMIN E 400 UNIT CAPS (VITAMIN E) once daily        Visit Type:  Follow-up Primary Provider:  Alroy Dust, MD   History of Present Illness: Mr. Kurtenbach returns today for ICD followup.  He is a pleasant elderly man with a h/o VT, atrial fibrillation,  and an ICM s/p ICD implant years ago.  He denies c/p or worsening sob. His CHF is class 2.  He has mild peripheral edema.  He has had no intercurrent ICD therapy.   Current Medications (verified): 1)  Coumadin 5 Mg Tabs (Warfarin Sodium) .... Take As Directed By Coumadin Clinic. 2)  Isosorbide Dinitrate 10 Mg  Tabs (Isosorbide Dinitrate) .... Take 1 Tablet By Mouth Three Times A Day As Directed... 3)  Coreg 6.25 Mg  Tabs (Carvedilol) .... Take One Tablet By Mouth Twice Daily. 4)  Hydralazine Hcl 10 Mg  Tabs (Hydralazine Hcl) .... Take 2 Tablets By Mouth Every 8 Hours 5)  Pacerone 200 Mg Tabs (Amiodarone Hcl) .... Take 1/2 Tab By Mouth Once Daily or As Directed 6)  Furosemide 40 Mg  Tabs (Furosemide) .... Take 1 Tablet Two Times A Day 7)  Fish Oil 1000 Mg Caps (Omega-3 Fatty Acids) .... Once Daily 8)  Levothyroxine Sodium 100 Mcg Tabs (Levothyroxine Sodium) .... Take 1 Tab By Mouth Once Daily.Marland KitchenMarland Kitchen 9)  Omeprazole 40 Mg Cpdr (Omeprazole) .... One Tablet By Mouth Once Daily 10)  Flomax 0.4 Mg  Cp24 (Tamsulosin Hcl) .... Take 1 Tab By Mouth At Bedtime As Directed By AVWUJWJ... 11)  Calcitriol 0.5 Mcg  Caps (Calcitriol) .Marland Kitchen.. 1 Tab Daily As Diercted By Drdeterding... 12)  Sodium Bicarbonate 648 Mg  Tabs (Sodium Bicarbonate (Antacid)) .... Take 2 Two Tabs Twice Daily As Directed By Drdeterding... 13)  Aricept 10 Mg  Tabs (Donepezil Hcl) .... Take 1 Tablet By Mouth Once A Day 14)   Amitriptyline Hcl 25 Mg  Tabs (Amitriptyline Hcl) .... Take One Tablet At Bedtime 15)  Alprazolam 0.5 Mg  Tabs (Alprazolam) .... As Needed 16)  Tussionex Pennkinetic Er 8-10 Mg/78ml Lqcr (Chlorpheniramine-Hydrocodone) .... Take 1 Teaspoon Every  12 Hours As Needed For Cough 17)  Magic Mouthwash .... 1 Tsp Gargle and Swallow Up To 4 Times Daily As Needed For Throat Symptoms... 18)  Multivitamins  Tabs (Multiple Vitamin) .... Once Daily 19)  Nitroglycerin 0.4 Mg Subl (Nitroglycerin) .... One Tablet Under Tongue Every 5 Minutes As Needed For Chest Pain---May Repeat Times Three 20)  Vitamin E 400 Unit Caps (Vitamin E) .... Once Daily  Allergies: 1)  ! Penicillin 2)  ! Amoxicillin 3)  ! * Ivp Dye  Past History:  Past Medical History: Last updated: 02/12/2009 OTHER SYMPTOMS INVOLVING HEAD AND NECK (ICD-784.99) COUMADIN THERAPY (ICD-V58.61) ANEMIA (ICD-285.9) HOARSENESS (ICD-784.49) COUGH (ICD-786.2) HYPERTENSION (ICD-401.9) CORONARY ARTERY DISEASE (ICD-414.00) ISCHEMIC CARDIOMYOPATHY (ICD-414.8) Hx of ATRIAL FLUTTER (ICD-427.32) PERIPHERAL VASCULAR DISEASE (ICD-443.9) HYPERCHOLESTEROLEMIA (ICD-272.0) HYPOTHYROIDISM (ICD-244.9) Hx of ISCHEMIC COLITIS (ICD-557.9) RENAL INSUFFICIENCY, CHRONIC (ICD-585.9) BENIGN PROSTATIC HYPERTROPHY, WITH OBSTRUCTION (ICD-600.01) Hx of PROSTATE CANCER (ICD-185) DEGENERATIVE JOINT DISEASE, GENERALIZED (ICD-715.00) DEMENTIA, MILD (ICD-294.8) ANXIETY (ICD-300.00)  Past Surgical History: Last updated: 01/21/2009 S/P CABG 1998 S/P total colectomy & ileostomy for ischemic colitis 1998  Review of Systems  The patient complains of dyspnea on exertion and peripheral edema.  The patient denies chest pain and syncope.    Vital Signs:  Patient profile:   77 year old male Height:      66 inches Weight:      197 pounds BMI:     31.91 Pulse rate:   63 / minute BP sitting:   160 / 80  (left arm)  Vitals Entered By: Laurance Flatten CMA (September 28, 2009  10:37 AM)  Physical Exam  General:  Well developed, well nourished, in no acute distress. Head:  normocephalic and atraumatic Eyes:  PERRLA/EOM intact; conjunctiva and lids normal. Mouth:  Teeth, gums and palate normal. Oral mucosa normal. Neck:  Neck supple, no JVD. No masses, thyromegaly or abnormal cervical nodes. Chest Wall:  Well healed ICD incision. Lungs:  Clear bilaterally with no wheezes, rales, or rhonchi or increased work of breathing. Heart:  RRR with normal S1 and S2.  PMI is enlarged and laterally displaced.  No murmurs. Abdomen:  Obese, NTNT with out rebound or guarding. Msk:  Back normal, normal gait. Muscle strength and tone normal. Pulses:  pulses normal in all 4 extremities Extremities:  No clubbing or cyanosis.  1+ peripheral edema. Neurologic:  Alert and oriented x 3.    ICD Specifications Following MD:  Lewayne Bunting, MD     ICD Vendor:  [facility] [employer]     ICD Model Number:  [id number]     ICD Serial Number:  [id number] ICD DOI:  07/22/2002     ICD Implanting MD:  Lewayne Bunting, MD  Lead 1:    Location: RA     DOI: 07/22/2002     Model #: [id number]     Serial #: [id number]     Status: active Lead 2:    Location: RV     DOI: 07/22/2002     Model #: [id number]     Serial #: [id number]     Status: active  Indications::  ICM, CHF   ICD Follow Up Battery Voltage:  ERI V     ICD Dependent:  No      Episodes Coumadin:  Yes  Brady Parameters Mode VVI     Lower Rate Limit:  40      Tachy Zones VF:  200     VT:  160     Tech Comments:  Battery @ ERI. Altha Harm, LPN  September 28, 2009 11:08 AM  MD Comments:  He has just reached ERI.  Will hold off on generator change as he has dementia, and is not PM dependent.  Impression & Recommendations:  Problem # 1:  AUTOMATIC IMPLANTABLE CARDIAC DEFIBRILLATOR SITU (ICD-V45.02) His device has just tripped ERI.  I will see him back in three months.  I discussed the option of not replacing the device and he is considering.  Problem # 2:  ATRIAL  FIBRILLATION (ICD-427.31) He appears to be in and out of atrial fib, at times with an RVR.  Continue meds as noted below. His updated medication list for this problem includes:    Coumadin 5 Mg Tabs (Warfarin sodium) .Marland Kitchen... Take as directed by coumadin clinic.    Coreg 6.25 Mg Tabs (Carvedilol) .Marland Kitchen... Take one tablet by mouth twice daily.    Pacerone 200 Mg Tabs (Amiodarone hcl) .Marland Kitchen... Take 1/2 tab by mouth once daily or as directed  Problem # 3:  CORONARY ARTERY DISEASE (ICD-414.00) He denies anginal symptoms.  Continue meds as noted below. His  updated medication list for this problem includes:    Coumadin 5 Mg Tabs (Warfarin sodium) .Marland Kitchen... Take as directed by coumadin clinic.    Isosorbide Dinitrate 10 Mg Tabs (Isosorbide dinitrate) .Marland Kitchen... Take 1 tablet by mouth three times a day as directed...    Coreg 6.25 Mg Tabs (Carvedilol) .Marland Kitchen... Take one tablet by mouth twice daily.    Nitroglycerin 0.4 Mg Subl (Nitroglycerin) ..... One tablet under tongue every 5 minutes as needed for chest pain---may repeat times three  Problem # 4:  DEMENTIA, MILD (ICD-294.8) Agree that his dementia is mild.  I will discuss the option of not replacing the ICD with Dr. [name].  Patient Instructions: 1)  Your physician recommends that you schedule a follow-up appointment in: 3 months with Dr Ladona Ridgel

## 2010-09-08 NOTE — Cardiovascular Report (Signed)
Summary: Office Visit Remote   Office Visit Remote   Imported By: Roderic Ovens 07/25/2010 16:01:58  _____________________________________________________________________  External Attachment:    Type:   Image     Comment:   External Document

## 2010-09-08 NOTE — Progress Notes (Signed)
Summary: PA for Omeprazole - approved 7.12.11 - 8.1.12  Phone Note Outgoing Call   Call placed by: Arman Filter LPN,  March 08, 2010 8:24 AM Call placed to: [facility] [phone] Summary of Call: received fax from [facility] in [place] requesting PA on pt's Omeprazole 40mg  tabs.  Called [facility] at [phone] and requested PA form be faxed to our office.  Received form and gave to Leigh to have SN sign and fill out so we can fax back to [facility] at [phone].  CASE #  [id number]  Initial call taken by: Arman Filter LPN,  March 08, 2010 8:26 AM  Follow-up for Phone Call        received PA approval for the omeprazole 40mg .  this has approved from 7.12.11 through 8.1.2012.  called spoke with pharmacist who stated that patient has already filed and picked up this rx. Boone Master CNA/MA  March 09, 2010 3:02 PM

## 2010-09-08 NOTE — Letter (Signed)
Summary: Implantable Device Instructions  Architectural technologist, Main Office  [address]   [place], [place] [address]   Phone: [phone]  Fax: [phone]      Implantable Device Instructions  You are scheduled for:   _____ Generator Change  on 01/28/10 with Dr. Ladona Ridgel.  1.  Please arrive at the Short Stay Center at [facility] at 8:00am on the day of your procedure.  2.  Do not eat or drink after midnight the night before your procedure.  3.  Complete lab work on 12/21/09.  T You do not have to be fasting.  4.  Do NOT take these medications for the morning of your procedure:  Furosemide,  Take your last dose of Coumadin on-will call if you need to hold.  5.  Plan for an overnight stay.  Bring your insurance cards and a list of your medications.  6.  Wash your chest and neck with antibacterial soap (any brand) the evening before and the morning of your procedure.  Rinse well.    *If you have ANY questions after you get home, please call the office [phone]. [name]  *Every attempt is made to prevent procedures from being rescheduled.  Due to the nauture of Electrophysiology, rescheduling can happen.  The physician is always aware and directs the staff when this occurs.

## 2010-09-08 NOTE — Progress Notes (Signed)
Summary: test results   Phone Note Call from Patient Call back at Home Phone [phone]   Caller: Other Relative- grand-dtr- danielle Reason for Call: Talk to Nurse, Lab or Test Results Initial call taken by: Lorne Skeens,  August 24, 2010 2:37 PM  Follow-up for Phone Call        Called pt's home.  Danielle unavailable.  Pt wanting results of BMP.  Will forward to [facility], Dr. Lubertha Basque RN.   [name] PharmD  August 24, 2010 4:48 PM  no BMP ordered  gave her the old results from 08/05/11 and sent those results to Dr Deterding Dennis Bast, RN, BSN  August 24, 2010 5:27 PM

## 2010-09-08 NOTE — Progress Notes (Signed)
Summary: refill med   Phone Note Refill Request Call back at Home Phone [phone] Message from:  Patient on Dec 24, 2009 3:38 PM  Refills Requested: Medication #1:  HYDRALAZINE HCL 10 MG  TABS take 2 tablets by mouth every 8 hours [facility] [place]   Method Requested: Fax to Local Pharmacy Initial call taken by: Lorne Skeens,  Dec 24, 2009 3:38 PM    Prescriptions: HYDRALAZINE HCL 10 MG  TABS (HYDRALAZINE HCL) take 2 tablets by mouth every 8 hours  #180 Tablet x 3   Entered by:   Kem Parkinson   Authorized by:   Laren Boom, MD, [facility]   Signed by:   Kem Parkinson on 12/24/2009   Method used:   Electronically to        [facility]  [address] (retail)       [address]       [place], [place]  [address]       Ph: [phone]       Fax: [phone]   RxID:   [id number]

## 2010-09-08 NOTE — Letter (Signed)
Summary: [facility]   Imported By: Lester [place] 04/13/2010 10:12:12  _____________________________________________________________________  External Attachment:    Type:   Image     Comment:   External Document

## 2010-09-08 NOTE — Letter (Signed)
Summary: [facility]  [facility]   Imported By: Lester  08/27/2009 09:44:38  _____________________________________________________________________  External Attachment:    Type:   Image     Comment:   External Document

## 2010-09-08 NOTE — Procedures (Signed)
Summary: wch/jss      Allergies Added:   Current Medications (verified): 1)  Coumadin 5 Mg Tabs (Warfarin Sodium) .... Take As Directed By Coumadin Clinic. 2)  Isosorbide Dinitrate 10 Mg  Tabs (Isosorbide Dinitrate) .... Take 1 Tablet By Mouth Three Times A Day As Directed... 3)  Coreg 6.25 Mg  Tabs (Carvedilol) .... Take One Tablet By Mouth Twice Daily. 4)  Hydralazine Hcl 10 Mg  Tabs (Hydralazine Hcl) .... Take 2 Tablets By Mouth Every 8 Hours 5)  Pacerone 200 Mg Tabs (Amiodarone Hcl) .... Take 1/2 Tab By Mouth Once Daily or As Directed 6)  Furosemide 40 Mg  Tabs (Furosemide) .... Take 2 in Am 1 in Pm 7)  Fish Oil 1000 Mg Caps (Omega-3 Fatty Acids) .... Once Daily 8)  Levothyroxine Sodium 100 Mcg Tabs (Levothyroxine Sodium) .... Take 1 Tab By Mouth Once Daily.Marland KitchenMarland Kitchen 9)  Omeprazole 40 Mg Cpdr (Omeprazole) .... One Tablet By Mouth Once Daily 10)  Flomax 0.4 Mg  Cp24 (Tamsulosin Hcl) .... Take 1 Tab By Mouth At Bedtime As Directed By IRJJOAC... 11)  Calcitriol 0.5 Mcg  Caps (Calcitriol) .... 2 Tablets Bid Drdeterding... 12)  Sodium Bicarbonate 648 Mg  Tabs (Sodium Bicarbonate (Antacid)) .... Take 2 Two Tabs Twice Daily As Directed By Drdeterding... 13)  Aricept 10 Mg  Tabs (Donepezil Hcl) .... Take 1 Tablet By Mouth Once A Day 14)  Amitriptyline Hcl 25 Mg  Tabs (Amitriptyline Hcl) .... Take One Tablet At Bedtime 15)  Alprazolam 0.5 Mg  Tabs (Alprazolam) .... Take 1 Tablet By Mouth Three Times A Day As Needed Anxiety 16)  Tussionex Pennkinetic Er 8-10 Mg/27ml Lqcr (Chlorpheniramine-Hydrocodone) .... Take 1 Teaspoon Every  12 Hours As Needed For Cough 17)  Magic Mouthwash .... 1 Tsp Gargle and Swallow Up To 4 Times Daily As Needed For Throat Symptoms... 18)  Multivitamins  Tabs (Multiple Vitamin) .... Once Daily 19)  Nitroglycerin 0.4 Mg Subl (Nitroglycerin) .... One Tablet Under Tongue Every 5 Minutes As Needed For Chest Pain---May Repeat Times Three 20)  Mucinex 600 Mg Xr12h-Tab (Guaifenesin)  .... As Needed  Allergies (verified): 1)  ! Penicillin 2)  ! Amoxicillin 3)  ! * Ivp Dye 4)  ! * Clindamycin   ICD Specifications Following MD:  Lewayne Bunting, MD     ICD Vendor:  [facility] [employer]     ICD Model Number:  E110     ICD Serial Number:  [id number] ICD DOI:  12/28/2009     ICD Implanting MD:  Lewayne Bunting, MD  Lead 1:    Location: RA     DOI: 07/22/2002     Model #: [id number]     Serial #: [id number]     Status: active Lead 2:    Location: RV     DOI: 07/22/2002     Model #: [id number]     Serial #: [id number]     Status: active  Indications::  ICM, CHF  Explantation Comments: 12/28/2009 [facility] [employer] Prizm [id number] explanted  ICD Follow Up Battery Voltage:  good V     Charge Time:  8.7 seconds     Battery Est. Longevity:  8.47yrs Underlying rhythm:  SR@60  ICD Dependent:  No       ICD Device Measurements Atrium:  Amplitude: 3.7 mV, Impedance: 625 ohms, Threshold: 1.0 V at 0.4 msec Right Ventricle:  Amplitude: >25.0 mV, Impedance: 707 ohms, Threshold: 1.0 V at 0.4 msec Shock Impedance: 63 ohms   Episodes MS Episodes:  10  Coumadin:  Yes Shock:  0     ATP:  0     Nonsustained:  10     Atrial Therapies:  0 Atrial Pacing:  <1%     Ventricular Pacing:  2%  Brady Parameters Mode VVI     Lower Rate Limit:  40      Tachy Zones VF:  200     VT:  160     Tech Comments:  WOUND CHECK--STERI STRIPS REMOVED.  WOUND WITHOUT REDNESS OR SWELLING.  NORMAL DEVICE FUNCTION.  LONGEST NST EPISODE WAS 41 SECONDS.  NST EPISODES WERE AF W/RVR.  LONGEST ATR EPISODE WAS 2 HRS 17 MINUTES.  ROV IN SEPT W/GT. Vella Kohler  January 13, 2010 11:15 AM

## 2010-09-08 NOTE — Assessment & Plan Note (Signed)
Summary: guidant/sl    Primary Provider:  Alroy Dust, MD   History of Present Illness: [name] returns today for ICD followup.  He is a pleasant elderly man with a h/o VT, atrial fibrillation,  and an ICM s/p ICD implant years ago.  He denies c/p or worsening sob. His CHF is class 2.  He has mild peripheral edema.  He has had no intercurrent ICD therapy.   He is otherwise stable.   Allergies: 1)  ! Penicillin 2)  ! Amoxicillin 3)  ! * Ivp Dye 4)  ! * Clindamycin  Past History:  Past Medical History: Last updated: 04/14/2010 HYPERTENSION (ICD-401.9) CORONARY ARTERY DISEASE (ICD-414.00) ISCHEMIC CARDIOMYOPATHY (ICD-414.8) CHRONIC SYSTOLIC HEART FAILURE (ICD-428.22) AUTOMATIC IMPLANTABLE CARDIAC DEFIBRILLATOR SITU (ICD-V45.02) ATRIAL FIBRILLATION (ICD-427.31) Hx of ATRIAL FLUTTER (ICD-427.32) PERIPHERAL VASCULAR DISEASE (ICD-443.9) HYPERCHOLESTEROLEMIA (ICD-272.0) HYPOTHYROIDISM (ICD-244.9) Hx of ISCHEMIC COLITIS (ICD-557.9) RENAL INSUFFICIENCY, CHRONIC (ICD-585.9) BENIGN PROSTATIC HYPERTROPHY, WITH OBSTRUCTION (ICD-600.01) Hx of PROSTATE CANCER (ICD-185) DEGENERATIVE JOINT DISEASE, GENERALIZED (ICD-715.00) DEMENTIA, MILD (ICD-294.8) ANXIETY (ICD-300.00) ANEMIA (ICD-285.9)  Past Surgical History: Last updated: 04/14/2010 S/P CABG 1998 S/P total colectomy & ileostomy for ischemic colitis 1998  Review of Systems       The patient complains of dyspnea on exertion and peripheral edema.  The patient denies chest pain and syncope.    Physical Exam  General:  Well developed, well nourished, in no acute distress. Head:  normocephalic and atraumatic Eyes:  PERRLA/EOM intact; conjunctiva and lids normal. Mouth:  Teeth, gums and palate normal. Oral mucosa normal. Neck:  Neck supple, no JVD. No masses, thyromegaly or abnormal cervical nodes. Chest Wall:  Well healed ICD incision. Lungs:  Clear bilaterally with no wheezes, rales, or rhonchi or increased work of  breathing. Heart:  RRR with normal S1 and S2.  PMI is enlarged and laterally displaced.  No murmurs. Abdomen:  Obese, NTNT with out rebound or guarding. Msk:  Back normal, normal gait. Muscle strength and tone normal. Pulses:  pulses normal in all 4 extremities Extremities:  No clubbing or cyanosis.  1+ peripheral edema. Neurologic:  Alert and oriented x 3.    ICD Specifications Following MD:  Lewayne Bunting, MD     ICD Vendor:  [facility] [employer]     ICD Model Number:  E110     ICD Serial Number:  [id number] ICD DOI:  12/28/2009     ICD Implanting MD:  Lewayne Bunting, MD  Lead 1:    Location: RA     DOI: 07/22/2002     Model #: [id number]     Serial #: [id number]     Status: active Lead 2:    Location: RV     DOI: 07/22/2002     Model #: [id number]     Serial #: [id number]     Status: active  Indications::  ICM, CHF  Explantation Comments: 12/28/2009 [facility] [employer] Prizm [id number] explanted  ICD Follow Up Remote Check?  No Battery Voltage:  BOL V     Charge Time:  8.8 seconds     Battery Est. Longevity:  10.5 years Underlying rhythm:  A-flutter ICD Dependent:  No       ICD Device Measurements Atrium:  Amplitude: 4.1 mV, Impedance: 646 ohms,  Right Ventricle:  Amplitude: 25 mV, Impedance: 695 ohms, Threshold: 0.7 V at 0.4 msec Shock Impedance: 69 ohms   Episodes MS Episodes:  1100     Percent Mode Switch:  15%     Coumadin:  Yes Shock:  0  ATP:  0     Nonsustained:  0     Atrial Pacing:  1%     Ventricular Pacing:  2%  Brady Parameters Mode DDD     Lower Rate Limit:  40     PAV 160     Sensed AV Delay:  200  Tachy Zones VF:  200     VT:  160     Next Remote Date:  12/01/2010     Next Cardiology Appt Due:  08/08/2011 Tech Comments:  No parameter changes. Device function normal.  A-flutter events with RVR.  Latitude transmissions every 3 months.  ROV 1 year with Dr. Ladona Ridgel.  Checked by [employer]. Altha Harm, LPN  August 30, 2010 11:49 AM  MD Comments:  Agree with above.  Impression &  Recommendations:  Problem # 1:  AUTOMATIC IMPLANTABLE CARDIAC DEFIBRILLATOR SITU (ICD-V45.02) His device is working normally. Continue followup as scheduled in several months.  Problem # 2:  CHRONIC SYSTOLIC HEART FAILURE (ICD-428.22) His symptoms are well controlled. He will continue his current meds and maintain a low sodium diet. His updated medication list for this problem includes:    Coumadin 5 Mg Tabs (Warfarin sodium) .Marland Kitchen... Take as directed by coumadin clinic.    Isosorbide Dinitrate 10 Mg Tabs (Isosorbide dinitrate) .Marland Kitchen... Take 1 tablet by mouth three times a day as directed...    Coreg 6.25 Mg Tabs (Carvedilol) .Marland Kitchen... Take one tablet by mouth twice daily.    Pacerone 200 Mg Tabs (Amiodarone hcl) .Marland Kitchen... Take 1/2 tab by mouth once daily or as directed    Furosemide 40 Mg Tabs (Furosemide) .Marland Kitchen... Take 2 in am 1 in pm    Nitroglycerin 0.4 Mg Subl (Nitroglycerin) ..... One tablet under tongue every 5 minutes as needed for chest pain---may repeat times three  Other Orders: TLB-Renal Function Panel ([id number])  Patient Instructions: 1)  Your physician wants you to follow-up in: 12 months with Dr Court Joy will receive a reminder letter in the mail two months in advance. If you don't receive a letter, please call our office to schedule the follow-up appointment. 2)  Your physician recommends that you continue on your current medications as directed. Please refer to the Current Medication list given to you today.

## 2010-09-08 NOTE — Medication Information (Signed)
Summary: Coumadin Clinic   Anticoagulant Therapy  Managed by: Bethena Midget, RN, BSN Referring MD: Sharrell Ku PCP: Alroy Dust, MD Supervising MD: Gala Romney MD, Reuel Boom Indication 1: Atrial Fibrillation Indication 2: Coronary Artery Disease Lab Used: [facility] Site: [address] PT 20.9 INR POC 1.78 INR RANGE 2.0-3.0  Dietary changes: no    Health status changes: no    Bleeding/hemorrhagic complications: no    Recent/future hospitalizations: no    Any changes in medication regimen? no    Recent/future dental: no  Any missed doses?: no       Is patient compliant with meds? yes       Allergies: 1)  ! Penicillin 2)  ! Amoxicillin 3)  ! * Ivp Dye 4)  ! * Clindamycin  Anticoagulation Management History:      His anticoagulation is being managed by telephone today.  Positive risk factors for bleeding include an age of 43 years or older and presence of serious comorbidities.  The bleeding index is 'intermediate risk'.  Positive CHADS2 values include History of CHF, History of HTN, and Age > 61 years old.  The start date was 09/13/2006.  His last INR was 1.78.  Prothrombin time is 20.9.  Anticoagulation responsible provider: Bensimhon MD, Reuel Boom.  INR POC: 1.78.    Anticoagulation Management Assessment/Plan:      The patient's current anticoagulation dose is Coumadin 5 mg tabs: Take as directed by coumadin clinic..  The target INR is 2.0-3.0.  The next INR is due 07/20/2010.  Anticoagulation instructions were given to spouse.  Results were reviewed/authorized by Bethena Midget, RN, BSN.  He was notified by Bethena Midget, RN, BSN.         Prior Anticoagulation Instructions: INR 1.92 Change dose to 5mg s daily. Recheck in 2 weeks.   Current Anticoagulation Instructions: INR 1.78 LMOM to call dosing. Bethena Midget, RN, BSN  July 07, 2010 3:07 PM  Today take 7.5mg s then change 5mg s daily except 7.5mg s on Mondays. Recheck in 2 weeks.

## 2010-09-08 NOTE — Progress Notes (Signed)
Summary: nebulizer  Phone Note Call from Patient Call back at Home Phone [phone]   Caller: spouse-Peggy Call For: Rhett Najera Reason for Call: Talk to Nurse Summary of Call: [name] wanted pt to have nebulizer to do neb treatments at home - real congested most of the time.  He would also need the neds that go in to it. Initial call taken by: Eugene Gavia,  November 24, 2009 2:07 PM  Follow-up for Phone Call        I advised pt that order was sent to [facility] on 11-22-09 for incentive spirometry. Advised if she had not heard from [facility] to call them to check on order. Carron Curie CMA  November 24, 2009 2:18 PM

## 2010-09-08 NOTE — Medication Information (Signed)
Summary: rov/tm  Anticoagulant Therapy  Managed by: Bethena Midget, RN, BSN Referring MD: Sharrell Ku PCP: Alroy Dust, MD Supervising MD: Shirlee Latch MD, Najat Olazabal Indication 1: Coronary Artery Disease (ICD-CAD) Lab Used: [facility] [place] Site: [address] INR POC 2.2 INR RANGE 2.0-3.0  Dietary changes: no    Health status changes: no    Bleeding/hemorrhagic complications: no    Recent/future hospitalizations: no    Any changes in medication regimen? no    Recent/future dental: no  Any missed doses?: no       Is patient compliant with meds? yes      Comments: Here today for MD visit, next check  will be back at lab.   Allergies: 1)  ! Penicillin 2)  ! Amoxicillin 3)  ! * Ivp Dye  Anticoagulation Management History:      The patient is taking warfarin and comes in today for a routine follow up visit.  Positive risk factors for bleeding include an age of 75 years or older and presence of serious comorbidities.  The bleeding index is 'intermediate risk'.  Positive CHADS2 values include History of HTN and Age > 75 years old.  The start date was 09/13/2006.  His last INR was 1.70.  Anticoagulation responsible provider: Shirlee Latch MD, [place]ed[place] Zou.  INR POC: 2.2.  Cuvette Lot#: [id number].  Exp: 03/2011.    Anticoagulation Management Assessment/Plan:      The patient's current anticoagulation dose is Coumadin 5 mg tabs: Take as directed by coumadin clinic..  The target INR is 2.0-3.0.  The next INR is due 01/04/2010.  Anticoagulation instructions were given to spouse/patient.  Results were reviewed/authorized by Bethena Midget, RN, BSN.  He was notified by Bethena Midget, RN, BSN.         Prior Anticoagulation Instructions: INR 1.7 Today take 7.5mg  then resume 5mg s daily except 2.5mg s on Sundays and Thursdays.   Current Anticoagulation Instructions: INR 2.2 Continue 5mg  everyday except 2.5mg  on Sundays and Thursdays. Recheck in 2 weeks.   Appended Document: Coumadin  Clinic    Anticoagulant Therapy  Managed by: Bethena Midget, RN, BSN Referring MD: Sharrell Ku PCP: Alroy Dust, MD Supervising MD: Shirlee Latch MD, Esco Joslyn Indication 1: Atrial Fibrillation Indication 2: Coronary Artery Disease Lab Used: [facility] [place] Site: [address] INR RANGE 2.0-3.0           Allergies: 1)  ! Penicillin 2)  ! Amoxicillin 3)  ! * Ivp Dye  Anticoagulation Management History:      Positive risk factors for bleeding include an age of 75 years or older and presence of serious comorbidities.  The bleeding index is 'intermediate risk'.  Positive CHADS2 values include History of HTN and Age > 75 years old.  The start date was 09/13/2006.  His last INR was 1.70.  Anticoagulation responsible provider: Shirlee Latch MD, Aidric Endicott.  Exp: 03/2011.    Anticoagulation Management Assessment/Plan:      The patient's current anticoagulation dose is Coumadin 5 mg tabs: Take as directed by coumadin clinic..  The target INR is 2.0-3.0.  The next INR is due 01/04/2010.  Anticoagulation instructions were given to spouse/patient.  Results were reviewed/authorized by Bethena Midget, RN, BSN.         Prior Anticoagulation Instructions: INR 2.2 Continue 5mg  everyday except 2.5mg  on Sundays and Thursdays. Recheck in 2 weeks.

## 2010-09-08 NOTE — Medication Information (Signed)
Summary: Coumadin Clinic  Anticoagulant Therapy  Managed by: [name], PharmD Referring MD: Sharrell Ku PCP: Alroy Dust, MD Supervising MD: Antoine Poche MD, Fayrene Fearing Indication 1: Atrial Fibrillation Indication 2: Coronary Artery Disease Lab Used: [facility] [place] Site: [address] INR POC 1.86 INR RANGE 2.0-3.0  Dietary changes: yes       Details: pt diet changed over the weekend because he was trying to decrease his potassium level  Health status changes: no    Bleeding/hemorrhagic complications: no    Recent/future hospitalizations: no    Any changes in medication regimen? no    Recent/future dental: no  Any missed doses?: no       Is patient compliant with meds? yes       Allergies: 1)  ! Penicillin 2)  ! Amoxicillin 3)  ! * Ivp Dye 4)  ! * Clindamycin  Anticoagulation Management History:      His anticoagulation is being managed by telephone today.  Positive risk factors for bleeding include an age of 18 years or older and presence of serious comorbidities.  The bleeding index is 'intermediate risk'.  Positive CHADS2 values include History of CHF, History of HTN, and Age > 15 years old.  The start date was 09/13/2006.  His last INR was 2.13.  Anticoagulation responsible provider: Antoine Poche MD, Fayrene Fearing.  INR POC: 1.86.  Exp: 03/2011.    Anticoagulation Management Assessment/Plan:      The patient's current anticoagulation dose is Coumadin 5 mg tabs: Take as directed by coumadin clinic..  The target INR is 2.0-3.0.  The next INR is due 04/18/2010.  Anticoagulation instructions were given to spouse/patient.  Results were reviewed/authorized by [name], PharmD.  He was notified by [name] PharmD.         Prior Anticoagulation Instructions: INR 2.06  Spoke with pt's wife.  Continue same dose of 1 tablet every day except 1/2 tablet on Sunday and Thursday.   Current Anticoagulation Instructions: INR 1.86  Spoke with pt's wife.  Take 1 1/2 tablets  today then resume same dose of 1 tablet every day except 1/2 tablet on Sunday and Thursday.  [name] PharmD  March 29, 2010 9:10 AM

## 2010-09-08 NOTE — Medication Information (Signed)
Summary: Coumadin Clinic  Anticoagulant Therapy  Managed by: Cloyde Reams, RN, BSN Referring MD: Sharrell Ku PCP: Alroy Dust, MD Supervising MD: Excell Seltzer MD, Casimiro Needle Indication 1: Atrial Fibrillation Indication 2: Coronary Artery Disease Lab Used: [facility] [place] Site: [address] PT 28.7 INR POC 2.73 INR RANGE 2.0-3.0  Dietary changes: no    Health status changes: no    Bleeding/hemorrhagic complications: no    Recent/future hospitalizations: no    Any changes in medication regimen? no    Recent/future dental: no  Any missed doses?: no       Is patient compliant with meds? yes      Comments: Per Luis Abed who is caretaker for pt.   Allergies: 1)  ! Penicillin 2)  ! Amoxicillin 3)  ! * Ivp Dye 4)  ! * Clindamycin  Anticoagulation Management History:      His anticoagulation is being managed by telephone today.  Positive risk factors for bleeding include an age of 75 years or older and presence of serious comorbidities.  The bleeding index is 'intermediate risk'.  Positive CHADS2 values include History of CHF, History of HTN, and Age > 47 years old.  The start date was 09/13/2006.  His last INR was 2.73.  Prothrombin time is 28.7.  Anticoagulation responsible provider: Excell Seltzer MD, Casimiro Needle.  INR POC: 2.73.    Anticoagulation Management Assessment/Plan:      The patient's current anticoagulation dose is Coumadin 5 mg tabs: Take as directed by coumadin clinic..  The target INR is 2.0-3.0.  The next INR is due 02/03/2010.  Anticoagulation instructions were given to spouse/patient.  Results were reviewed/authorized by Cloyde Reams, RN, BSN.  He was notified by Bethena Midget, RN, BSN.         Prior Anticoagulation Instructions: INR 2.2 Continue 5mg  everyday except 2.5mg  on Sundays and Thursdays. Recheck in 2 weeks.   Current Anticoagulation Instructions: INR 2.73 Continue 5mg s daily except 2.5mg s on Sundays and Thursdays. Recheck in 4  weeks.

## 2010-09-08 NOTE — Medication Information (Signed)
Summary: [facility]  Anticoagulant Therapy  Managed by: Bethena Midget, RN, BSN Referring MD: Sharrell Ku PCP: Alroy Dust, MD Supervising MD: Jens Som MD, Arlys John Indication 1: Coronary Artery Disease (ICD-CAD) Lab Used: [facility] Site: [address] PT 19.8 INR POC 1.7 INR RANGE 2.0-3.0  Dietary changes: no    Health status changes: no    Bleeding/hemorrhagic complications: no    Recent/future hospitalizations: no    Any changes in medication regimen? no    Recent/future dental: no  Any missed doses?: no       Is patient compliant with meds? yes       Allergies: 1)  ! Penicillin 2)  ! Amoxicillin 3)  ! * Ivp Dye  Anticoagulation Management History:      His anticoagulation is being managed by telephone today.  Positive risk factors for bleeding include an age of 75 years or older and presence of serious comorbidities.  The bleeding index is 'intermediate risk'.  Positive CHADS2 values include History of HTN and Age > 36 years old.  The start date was 09/13/2006.  His last INR was 1.79.  Prothrombin time is 19.8.  Anticoagulation responsible provider: Jens Som MD, Arlys John.  INR POC: 1.7.    Anticoagulation Management Assessment/Plan:      The patient's current anticoagulation dose is Coumadin 5 mg tabs: Take as directed by coumadin clinic..  The target INR is 2.0-3.0.  The next INR is due 12/21/2009.  Anticoagulation instructions were given to spouse.  Results were reviewed/authorized by Bethena Midget, RN, BSN.  He was notified by Bethena Midget, RN, BSN.         Prior Anticoagulation Instructions: INR 2.6   Continue same dose of 5mg  every day except 1/2 tablet on Sunday and Thursday.  Recheck INR 3 weeks at [facility] in [place].  Gave instructions to pt's wife.   Current Anticoagulation Instructions: INR 1.7 Today take 7.5mg  then resume 5mg s daily except 2.5mg s on Sundays and Thursdays.

## 2010-09-08 NOTE — Medication Information (Signed)
Summary: [facility] Clinic  Anticoagulant Therapy  Managed by: [name], PharmD Referring MD: Sharrell Ku PCP: Alroy Dust, MD Supervising MD: Graciela Husbands MD, Viviann Spare Indication 1: Atrial Fibrillation Indication 2: Coronary Artery Disease Lab Used: [facility] Site: [address] PT 18.6 INR POC 1.8 INR RANGE 2.0-3.0  Dietary changes: no    Health status changes: no    Bleeding/hemorrhagic complications: no    Recent/future hospitalizations: no    Any changes in medication regimen? no    Recent/future dental: no  Any missed doses?: no       Is patient compliant with meds? yes       Allergies: 1)  ! Penicillin 2)  ! Amoxicillin 3)  ! * Ivp Dye 4)  ! * Clindamycin  Anticoagulation Management History:      His anticoagulation is being managed by telephone today.  Positive risk factors for bleeding include an age of 75 years or older and presence of serious comorbidities.  The bleeding index is 'intermediate risk'.  Positive CHADS2 values include History of CHF, History of HTN, and Age > 30 years old.  The start date was 09/13/2006.  His last INR was 2.73.  Prothrombin time is 18.6.  Anticoagulation responsible provider: Graciela Husbands MD, Viviann Spare.  INR POC: 1.8.  Exp: 03/2011.    Anticoagulation Management Assessment/Plan:      The patient's current anticoagulation dose is Coumadin 5 mg tabs: Take as directed by coumadin clinic..  The target INR is 2.0-3.0.  The next INR is due 03/07/2010.  Anticoagulation instructions were given to spouse/patient.  Results were reviewed/authorized by [name], PharmD.  He was notified by [name] PharmD.         Prior Anticoagulation Instructions: INR 2.73 Continue 5mg s daily except 2.5mg s on Sundays and Thursdays. Recheck in 4 weeks.   Current Anticoagulation Instructions: INR 1.8  Labs received from Dr. Deterding's office today. Spoke with pt's wife.  Take extra 1/2 tablet today then resume same dose of 1 tablet every day  except 1/2 tablet on Sunday and Thursday.  Recheck INR in 3 weeks.  [name] PharmD  February 14, 2010 3:56 PM

## 2010-09-08 NOTE — Medication Information (Signed)
Summary: Coumadin Clinic  Anticoagulant Therapy  Managed by: Bethena Midget, RN, BSN Referring MD: Sharrell Ku PCP: Alroy Dust, MD Supervising MD: Tenny Craw MD, Gunnar Fusi Indication 1: Coronary Artery Disease (ICD-CAD) Lab Used: [facility] [place] Site: [address] PT 20.6 INR POC 1.79 INR RANGE 2.0-3.0  Dietary changes: no    Health status changes: no    Bleeding/hemorrhagic complications: no    Recent/future hospitalizations: no    Any changes in medication regimen? no    Recent/future dental: no  Any missed doses?: no       Is patient compliant with meds? yes       Allergies: 1)  ! Penicillin 2)  ! Amoxicillin 3)  ! * Ivp Dye  Anticoagulation Management History:      His anticoagulation is being managed by telephone today.  Positive risk factors for bleeding include an age of 75 years or older and presence of serious comorbidities.  The bleeding index is 'intermediate risk'.  Positive CHADS2 values include History of HTN and Age > 75 years old.  The start date was 09/13/2006.  His last INR was 1.79.  Prothrombin time is 20.6.  Anticoagulation responsible provider: Tenny Craw MD, Gunnar Fusi.  INR POC: 1.79.    Anticoagulation Management Assessment/Plan:      The patient's current anticoagulation dose is Coumadin 5 mg tabs: Take as directed by coumadin clinic..  The target INR is 2.0-3.0.  The next INR is due 11/17/2009.  Anticoagulation instructions were given to spouse.  Results were reviewed/authorized by Bethena Midget, RN, BSN.  He was notified by [name], Pharm D.         Prior Anticoagulation Instructions: INR 1.90 Today take 5mg s then continue 5mg  daily except 2.5mg s TT&Sun. Recheck in 4 weeks. Spoke with wife and she is aware of dose and recheck inr.   Current Anticoagulation Instructions: INR 1.79 Change dose to 7.5mg s today then change dose to 5mg s daily except 2.5mg s on Sundays and Thursdays. Recheck INR in 2 weeks. Spouse aware of dose and redraw date.

## 2010-09-08 NOTE — Medication Information (Signed)
Summary: Coumadin Clinic  Anticoagulant Therapy  Managed by: Bethena Midget, RN, BSN Referring MD: Sharrell Ku PCP: Alroy Dust, MD Supervising MD: Daleen Squibb MD, Maisie Fus Indication 1: Atrial Fibrillation Indication 2: Coronary Artery Disease Lab Used: [facility] [place] Site: [address] PT 22.6 INR POC 1.97 INR RANGE 2.0-3.0  Dietary changes: no    Health status changes: no    Bleeding/hemorrhagic complications: no    Recent/future hospitalizations: no    Any changes in medication regimen? no    Recent/future dental: no  Any missed doses?: no       Is patient compliant with meds? yes       Allergies: 1)  ! Penicillin 2)  ! Amoxicillin 3)  ! * Ivp Dye 4)  ! * Clindamycin  Anticoagulation Management History:      His anticoagulation is being managed by telephone today.  Positive risk factors for bleeding include an age of 75 years or older and presence of serious comorbidities.  The bleeding index is 'intermediate risk'.  Positive CHADS2 values include History of CHF, History of HTN, and Age > 55 years old.  The start date was 09/13/2006.  His last INR was 1.97.  Prothrombin time is 22.6.  Anticoagulation responsible provider: Daleen Squibb MD, Maisie Fus.  INR POC: 1.97.    Anticoagulation Management Assessment/Plan:      The patient's current anticoagulation dose is Coumadin 5 mg tabs: Take as directed by coumadin clinic..  The target INR is 2.0-3.0.  The next INR is due 05/31/2010.  Anticoagulation instructions were given to [facility].  Results were reviewed/authorized by Bethena Midget, RN, BSN.  He was notified by Bethena Midget, RN, BSN.         Prior Anticoagulation Instructions: INR 2.19  Called spoke with pt's wife, advised to continue same dosage 5mg  daily except 2.5mg  on Sundays and Thursdays.  Recheck in 4 weeks.    Current Anticoagulation Instructions: INR 1.97 Change dose to 5mg s daily except 2.5mg s on Sundays. Recheck in 2 weeks.

## 2010-09-08 NOTE — Letter (Signed)
Summary: [facility] Patient Note   [facility] Patient Note   Imported By: Roderic Ovens 03/04/2010 13:38:50  _____________________________________________________________________  External Attachment:    Type:   Image     Comment:   External Document

## 2010-09-08 NOTE — Assessment & Plan Note (Signed)
Summary: guidant  Medications Added FISH OIL 1000 MG CAPS (OMEGA-3 FATTY ACIDS) 2 tabs daily        Visit Type:  Follow-up Primary Provider:  Alroy Dust, MD   History of Present Illness: [name] returns today for ICD followup.  He is a pleasant elderly man with a h/o VT, atrial fibrillation,  and an ICM s/p ICD implant years ago.  He denies c/p or worsening sob. His CHF is class 2.  He has mild peripheral edema.  He has had no intercurrent ICD therapy.  He had developed hyperkalemia several weeks ago which resolved with diet and medication changes.  He is otherwise stable.   Current Medications (verified): 1)  Coumadin 5 Mg Tabs (Warfarin Sodium) .... Take As Directed By Coumadin Clinic. 2)  Isosorbide Dinitrate 10 Mg  Tabs (Isosorbide Dinitrate) .... Take 1 Tablet By Mouth Three Times A Day As Directed... 3)  Coreg 6.25 Mg  Tabs (Carvedilol) .... Take One Tablet By Mouth Twice Daily. 4)  Hydralazine Hcl 10 Mg  Tabs (Hydralazine Hcl) .... Take 2 Tablets By Mouth Every 8 Hours 5)  Pacerone 200 Mg Tabs (Amiodarone Hcl) .... Take 1/2 Tab By Mouth Once Daily or As Directed 6)  Furosemide 40 Mg  Tabs (Furosemide) .... Take 2 in Am 1 in Pm 7)  Fish Oil 1000 Mg Caps (Omega-3 Fatty Acids) .... 2 Tabs Daily 8)  Levothyroxine Sodium 100 Mcg Tabs (Levothyroxine Sodium) .... Take 1 Tab By Mouth Once Daily.Marland KitchenMarland Kitchen 9)  Omeprazole 40 Mg Cpdr (Omeprazole) .... One Tablet By Mouth Once Daily 10)  Flomax 0.4 Mg  Cp24 (Tamsulosin Hcl) .... Take 1 Tab By Mouth At Bedtime As Directed By [facility]... 11)  Calcitriol 0.5 Mcg  Caps (Calcitriol) .... 2 Tablets Bid Drdeterding... 12)  Sodium Bicarbonate 648 Mg  Tabs (Sodium Bicarbonate (Antacid)) .... Take 2 Two Tabs Twice Daily As Directed By Drdeterding... 13)  Aricept 10 Mg  Tabs (Donepezil Hcl) .... Take 1 Tablet By Mouth Once A Day 14)  Amitriptyline Hcl 25 Mg  Tabs (Amitriptyline Hcl) .... Take One Tablet At Bedtime 15)  Alprazolam 0.5 Mg  Tabs (Alprazolam)  .... Take 1 Tablet By Mouth Three Times A Day As Needed Anxiety 16)  Tussionex Pennkinetic Er 8-10 Mg/55ml Lqcr (Chlorpheniramine-Hydrocodone) .... Take 1 Teaspoon Every  12 Hours As Needed For Cough 17)  Magic Mouthwash .... 1 Tsp Gargle and Swallow Up To 4 Times Daily As Needed For Throat Symptoms... 18)  Multivitamins  Tabs (Multiple Vitamin) .... Once Daily 19)  Nitroglycerin 0.4 Mg Subl (Nitroglycerin) .... One Tablet Under Tongue Every 5 Minutes As Needed For Chest Pain---May Repeat Times Three 20)  Mucinex 600 Mg Xr12h-Tab (Guaifenesin) .... As Needed  Allergies: 1)  ! Penicillin 2)  ! Amoxicillin 3)  ! * Ivp Dye 4)  ! * Clindamycin  Past History:  Past Medical History: Last updated: 10/14/2009  OTHER SYMPTOMS INVOLVING HEAD AND NECK (ICD-784.99) COUMADIN THERAPY (ICD-V58.61) ANEMIA (ICD-285.9) HOARSENESS (ICD-784.49) COUGH (ICD-786.2) HYPERTENSION (ICD-401.9) CORONARY ARTERY DISEASE (ICD-414.00) ISCHEMIC CARDIOMYOPATHY (ICD-414.8) Hx of ATRIAL FLUTTER (ICD-427.32) PERIPHERAL VASCULAR DISEASE (ICD-443.9) HYPERCHOLESTEROLEMIA (ICD-272.0) HYPOTHYROIDISM (ICD-244.9) Hx of ISCHEMIC COLITIS (ICD-557.9) RENAL INSUFFICIENCY, CHRONIC (ICD-585.9) BENIGN PROSTATIC HYPERTROPHY, WITH OBSTRUCTION (ICD-600.01) Hx of PROSTATE CANCER (ICD-185) DEGENERATIVE JOINT DISEASE, GENERALIZED (ICD-715.00) DEMENTIA, MILD (ICD-294.8) ANXIETY (ICD-300.00)  Past Surgical History: Last updated: 12/20/2009 S/P CABG 1998 S/P total colectomy & ileostomy for ischemic colitis 1998  Review of Systems       The patient complains of  peripheral edema.  The patient denies chest pain, syncope, and dyspnea on exertion.    Vital Signs:  Patient profile:   77 year old male Height:      66 inches Weight:      188 pounds BMI:     30.45 Pulse rate:   70 / minute BP sitting:   120 / 58  (left arm)  Vitals Entered By: Laurance Flatten CMA (April 12, 2010 2:41 PM)  Physical Exam  General:  Well  developed, well nourished, in no acute distress. Head:  normocephalic and atraumatic Eyes:  PERRLA/EOM intact; conjunctiva and lids normal. Neck:  Neck supple, no JVD. No masses, thyromegaly or abnormal cervical nodes. Chest Wall:  Well healed ICD incision. Lungs:  Clear bilaterally with no wheezes, rales, or rhonchi or increased work of breathing. Heart:  RRR with normal S1 and S2.  PMI is enlarged and laterally displaced.  No murmurs. Abdomen:  Obese, NTNT with out rebound or guarding. Msk:  Back normal, normal gait. Muscle strength and tone normal. Pulses:  pulses normal in all 4 extremities Extremities:  No clubbing or cyanosis.  1+ peripheral edema. Neurologic:  Alert and oriented x 3.    ICD Specifications Following MD:  Lewayne Bunting, MD     ICD Vendor:  [facility] [employer]     ICD Model Number:  E110     ICD Serial Number:  [id number] ICD DOI:  12/28/2009     ICD Implanting MD:  Lewayne Bunting, MD  Lead 1:    Location: RA     DOI: 07/22/2002     Model #: [id number]     Serial #: [id number]     Status: active Lead 2:    Location: RV     DOI: 07/22/2002     Model #: [id number]     Serial #: [id number]     Status: active  Indications::  ICM, CHF  Explantation Comments: 12/28/2009 [facility] [employer] Prizm [id number] explanted  ICD Follow Up Remote Check?  No Charge Time:  8.8 seconds     Battery Est. Longevity:  8.5 years Underlying rhythm:  SR@70  ICD Dependent:  No       ICD Device Measurements Atrium:  Amplitude: 4.4 mV, Impedance: 649 ohms, Threshold: 0.8 V at 0.4 msec Right Ventricle:  Amplitude: 25 mV, Impedance: 692 ohms, Threshold: 0.6 V at 0.4 msec Shock Impedance: 67 ohms   Episodes MS Episodes:  414     Percent Mode Switch:  55     Coumadin:  Yes Shock:  0     ATP:  0     Nonsustained:  602     Atrial Pacing:  <1%     Ventricular Pacing:  2%  Brady Parameters Mode ddd     Lower Rate Limit:  40      Tachy Zones VF:  200     VT:  160     Tech Comments:  NSVT episodes were A-fib, + coumdin.   Checked by [employer].  Latitude transmissions every 3 months.  ROV 1 year with Dr. Ladona Ridgel. Altha Harm, LPN  April 12, 2010 2:57 PM  MD Comments:  Agree with above.  Impression & Recommendations:  Problem # 1:  AUTOMATIC IMPLANTABLE CARDIAC DEFIBRILLATOR SITU (ICD-V45.02) His device is working normally.  Will recheck in several months.  Problem # 2:  CHRONIC SYSTOLIC HEART FAILURE (ICD-428.22) He is instructed to continue his current meds, maintain a low sodium diet and remain active. His  updated medication list for this problem includes:    Coumadin 5 Mg Tabs (Warfarin sodium) .Marland Kitchen... Take as directed by coumadin clinic.    Isosorbide Dinitrate 10 Mg Tabs (Isosorbide dinitrate) .Marland Kitchen... Take 1 tablet by mouth three times a day as directed...    Coreg 6.25 Mg Tabs (Carvedilol) .Marland Kitchen... Take one tablet by mouth twice daily.    Pacerone 200 Mg Tabs (Amiodarone hcl) .Marland Kitchen... Take 1/2 tab by mouth once daily or as directed    Furosemide 40 Mg Tabs (Furosemide) .Marland Kitchen... Take 2 in am 1 in pm    Nitroglycerin 0.4 Mg Subl (Nitroglycerin) ..... One tablet under tongue every 5 minutes as needed for chest pain---may repeat times three  Problem # 3:  ATRIAL FIBRILLATION (ICD-427.31) He is still on low dose pacerone.  At this time I will hold off on increasing the dose though this would be a consideration in the future. His updated medication list for this problem includes:    Coumadin 5 Mg Tabs (Warfarin sodium) .Marland Kitchen... Take as directed by coumadin clinic.    Coreg 6.25 Mg Tabs (Carvedilol) .Marland Kitchen... Take one tablet by mouth twice daily.    Pacerone 200 Mg Tabs (Amiodarone hcl) .Marland Kitchen... Take 1/2 tab by mouth once daily or as directed  Patient Instructions: 1)  Your physician recommends that you schedule a follow-up appointment in: 4 months with Dr Ladona Ridgel

## 2010-09-08 NOTE — Cardiovascular Report (Signed)
Summary: ICD Warranty Validation and Lead Registration   ICD Warranty Validation and Lead Registration   Imported By: Roderic Ovens 02/01/2010 15:32:46  _____________________________________________________________________  External Attachment:    Type:   Image     Comment:   External Document

## 2010-09-08 NOTE — Miscellaneous (Signed)
Summary: Device change out  Clinical Lists Changes  Observations: Added new observation of ICD IMPL DTE: 12/28/2009 (12/29/2009 16:19) Added new observation of ICD SERL#: [id number]  (12/29/2009 16:19) Added new observation of ICD MODL#: E110  (12/29/2009 16:19) Added new observation of ICDEXPLCOMM: 12/28/2009 [employer] Prizm [id number] explanted  (12/29/2009 16:19)       ICD Specifications Following MD:  Lewayne Bunting, MD     ICD Vendor:  [facility] [employer]     ICD Model Number:  E110     ICD Serial Number:  [id number] ICD DOI:  12/28/2009     ICD Implanting MD:  Lewayne Bunting, MD  Lead 1:    Location: RA     DOI: 07/22/2002     Model #: [id number]     Serial #: [id number]     Status: active Lead 2:    Location: RV     DOI: 07/22/2002     Model #: [id number]     Serial #: [id number]     Status: active  Indications::  ICM, CHF  Explantation Comments: 12/28/2009 [facility] [employer] Prizm [id number] explanted  ICD Follow Up ICD Dependent:  No      Episodes Coumadin:  Yes  Brady Parameters Mode VVI     Lower Rate Limit:  40      Tachy Zones VF:  200     VT:  160

## 2010-09-08 NOTE — Letter (Signed)
Summary: Remote Device Check  [facility] [employer], Main Office  [address]   [place], [place] [address]   Phone: [phone]  Fax: [phone]     July 22, 2010 MRN: [id number]   [name] [address] [place], [place]  [address]   Dear [name],   Your remote transmission was recieved and reviewed by your physician.  All diagnostics were within normal limits for you.   __X____Your next office visit is scheduled for:  08-30-10 @1140  with Dr Ladona Ridgel.    Sincerely,  Vella Kohler

## 2010-09-08 NOTE — Letter (Signed)
Summary: [name]  Lab - Standing Order Request  [facility]  Lab - Standing Order Request   Imported By: Marylou Mccoy 08/15/2010 16:33:45  _____________________________________________________________________  External Attachment:    Type:   Image     Comment:   External Document

## 2010-09-08 NOTE — Letter (Signed)
Summary: [facility] Patient Note   [facility] Patient Note   Imported By: Roderic Ovens 07/26/2010 12:28:57  _____________________________________________________________________  External Attachment:    Type:   Image     Comment:   External Document

## 2010-09-08 NOTE — Cardiovascular Report (Signed)
Summary: JPre Op Orders  JPre Op Orders   Imported By: Roderic Ovens 01/05/2010 14:11:01  _____________________________________________________________________  External Attachment:    Type:   Image     Comment:   External Document

## 2010-09-09 ENCOUNTER — Ambulatory Visit
Admission: RE | Admit: 2010-09-09 | Discharge: 2010-09-09 | Disposition: A | Discharge status: Home / Self Care | Payer: Self-pay | Source: Ambulatory Visit | Attending: Nephrology | Admitting: Nephrology

## 2010-09-09 DIAGNOSIS — N184 Chronic kidney disease, stage 4 (severe): Secondary | ICD-10-CM

## 2010-09-09 NOTE — Cardiovascular Report (Signed)
Summary: Office Visit   Office Visit   Imported By: Roderic Ovens 01/07/2010 10:38:22  _____________________________________________________________________  External Attachment:    Type:   Image     Comment:   External Document

## 2010-09-09 NOTE — Cardiovascular Report (Signed)
Summary: Office Visit   Office Visit   Imported By: Roderic Ovens 10/07/2009 14:07:48  _____________________________________________________________________  External Attachment:    Type:   Image     Comment:   External Document

## 2010-09-09 NOTE — Letter (Signed)
Summary: CMN for [facility]  CMN for [facility]   Imported By: Sherian Rein 04/18/2010 08:28:16  _____________________________________________________________________  External Attachment:    Type:   Image     Comment:   External Document

## 2010-09-20 ENCOUNTER — Encounter: Payer: Self-pay | Admitting: Pulmonary Disease

## 2010-09-20 DIAGNOSIS — Z7901 Long term (current) use of anticoagulants: Secondary | ICD-10-CM | POA: Insufficient documentation

## 2010-09-20 DIAGNOSIS — I4892 Unspecified atrial flutter: Secondary | ICD-10-CM

## 2010-09-20 DIAGNOSIS — I4891 Unspecified atrial fibrillation: Secondary | ICD-10-CM

## 2010-09-22 ENCOUNTER — Encounter (INDEPENDENT_AMBULATORY_CARE_PROVIDER_SITE_OTHER): Payer: Self-pay | Admitting: Pharmacist

## 2010-09-22 ENCOUNTER — Encounter: Payer: Self-pay | Admitting: Internal Medicine

## 2010-09-22 NOTE — Cardiovascular Report (Signed)
Summary: Office Visit   Office Visit   Imported By: Roderic Ovens 09/14/2010 10:02:43  _____________________________________________________________________  External Attachment:    Type:   Image     Comment:   External Document

## 2010-09-23 ENCOUNTER — Encounter: Payer: Self-pay | Admitting: Internal Medicine

## 2010-09-26 ENCOUNTER — Encounter: Payer: Self-pay | Admitting: Internal Medicine

## 2010-09-29 ENCOUNTER — Encounter: Payer: Self-pay | Admitting: Pulmonary Disease

## 2010-10-04 NOTE — Medication Information (Signed)
Summary: Coumadin Clinic  Anticoagulant Therapy  Managed by: Bethena Midget, RN, BSN Referring MD: Sharrell Ku PCP: Alroy Dust, MD Supervising MD: Tenny Craw MD, Gunnar Fusi Indication 1: Atrial Fibrillation Indication 2: Coronary Artery Disease Lab Used: [facility] [place] Site: [address] PT 23.8 INR POC 2.11 INR RANGE 2.0-3.0  Dietary changes: no    Health status changes: no    Bleeding/hemorrhagic complications: no    Recent/future hospitalizations: no    Any changes in medication regimen? no    Recent/future dental: no  Any missed doses?: no       Is patient compliant with meds? yes       Allergies: 1)  ! Penicillin 2)  ! Amoxicillin 3)  ! * Ivp Dye 4)  ! * Clindamycin  Anticoagulation Management History:      His anticoagulation is being managed by telephone today.  Positive risk factors for bleeding include an age of 24 years or older and presence of serious comorbidities.  The bleeding index is 'intermediate risk'.  Positive CHADS2 values include History of CHF, History of HTN, and Age > 81 years old.  The start date was 09/13/2006.  His last INR was 3.43.  Prothrombin time is 23.8.  Anticoagulation responsible provider: Tenny Craw MD, Gunnar Fusi.  INR POC: 2.11.    Anticoagulation Management Assessment/Plan:      The patient's current anticoagulation dose is Coumadin 5 mg tabs: Take as directed by coumadin clinic..  The target INR is 2.0-3.0.  The next INR is due 10/20/2010.  Anticoagulation instructions were given to spouse.  Results were reviewed/authorized by Bethena Midget, RN, BSN.  He was notified by Bethena Midget, RN, BSN.         Prior Anticoagulation Instructions: INR 2.9  Continue with 1 tablet every day except 1/2 tablet on Monday.  Current Anticoagulation Instructions: INR 2.11 Continue 5mg s everyday except 2.5mg  on Mondays. Recheck in 4 weeks.

## 2010-10-11 ENCOUNTER — Encounter: Payer: Self-pay | Admitting: Pulmonary Disease

## 2010-10-11 ENCOUNTER — Other Ambulatory Visit: Payer: Medicare Other

## 2010-10-11 ENCOUNTER — Other Ambulatory Visit: Payer: Self-pay | Admitting: Pulmonary Disease

## 2010-10-11 ENCOUNTER — Ambulatory Visit (INDEPENDENT_AMBULATORY_CARE_PROVIDER_SITE_OTHER): Payer: Medicare Other | Admitting: Pulmonary Disease

## 2010-10-11 DIAGNOSIS — I739 Peripheral vascular disease, unspecified: Secondary | ICD-10-CM

## 2010-10-11 DIAGNOSIS — I10 Essential (primary) hypertension: Secondary | ICD-10-CM

## 2010-10-11 DIAGNOSIS — E039 Hypothyroidism, unspecified: Secondary | ICD-10-CM

## 2010-10-11 DIAGNOSIS — N184 Chronic kidney disease, stage 4 (severe): Secondary | ICD-10-CM

## 2010-10-11 DIAGNOSIS — E785 Hyperlipidemia, unspecified: Secondary | ICD-10-CM

## 2010-10-11 DIAGNOSIS — E7800 Pure hypercholesterolemia, unspecified: Secondary | ICD-10-CM

## 2010-10-11 DIAGNOSIS — I2510 Atherosclerotic heart disease of native coronary artery without angina pectoris: Secondary | ICD-10-CM

## 2010-10-11 DIAGNOSIS — Z95810 Presence of automatic (implantable) cardiac defibrillator: Secondary | ICD-10-CM

## 2010-10-11 DIAGNOSIS — I2589 Other forms of chronic ischemic heart disease: Secondary | ICD-10-CM

## 2010-10-11 LAB — BASIC METABOLIC PANEL
BUN: 70 mg/dL — ABNORMAL HIGH (ref 6–23)
CO2: 25 mEq/L (ref 19–32)
Chloride: 102 mEq/L (ref 96–112)
Glucose, Bld: 85 mg/dL (ref 70–99)
Potassium: 4.7 mEq/L (ref 3.5–5.1)

## 2010-10-11 LAB — LIPID PANEL: Cholesterol: 179 mg/dL (ref 0–200)

## 2010-10-11 LAB — HEPATIC FUNCTION PANEL
ALT: 23 U/L (ref 0–53)
AST: 26 U/L (ref 0–37)
Albumin: 3.9 g/dL (ref 3.5–5.2)
Alkaline Phosphatase: 40 U/L (ref 39–117)

## 2010-10-11 LAB — LDL CHOLESTEROL, DIRECT: Direct LDL: 91.9 mg/dL

## 2010-10-13 NOTE — Letter (Signed)
Summary: Nadara Mustard MD  Nadara Mustard MD   Imported By: Lester Landmark 10/04/2010 10:03:26  _____________________________________________________________________  External Attachment:    Type:   Image     Comment:   External Document

## 2010-10-18 NOTE — Assessment & Plan Note (Signed)
Summary: 6 month follow up   Primary Care Provider:  Alroy Dust, MD  CC:  6 month ROV & revew of mult medical problems....  History of Present Illness: 77 y/o WM here for a follow up visit... he has mult medical problems all expertly followed by his specialty physicians...    ~  October 11, 2010:  here for a 58mo ROV- wife isn't here today due to recent fall w/ lumbar compression fx... he continues to see his mult specialty physicians regularly>     ** had Cards f/u DrTaylor 2/11 for ischemic cardiomyopathy, hx VT w/ implanted defib, AFib on Amio... ICD battery near end of life & wife very upset about conversation regarding poss not replacing the unit due to comorbidities etc> he had a new cardioverter defibrillator implanted by DrTaylor 5/11, and an antibiotic reaction to the Clindamycin used post op (he's PCN allergic)... most recent f/u 1/12- devise check OK, continue same meds.    ** had GI f/u DrStark 3/10- reflux w/ LER, cough, hoarse;  Rx'd Nexium & antireflux reg- improved... remote hx ischemic colitis w/ colectomy & perm ileostomy...    ** had Nephrology f/u DrDeterding 10/11 & 2/12- CKD w/ Creat= 2.4-2.8; RTA on Bicarb 2Bid w/ HCO3= 29; secondary hyperpara on Calcitriol 9mcg/d w/ Ca= 8.7-9.6, Phos= 3.8-4.8 & PTH= 50-33; Hg norm= 14.7 & Fe= 113... he is slated to view RenalFailure video in anticipation of ?dialysis...    ** had Urology 11/11 now w/ DrWrenn- hx prostate cancer2003 w/ XRT, rising PSA in follow up w/ doubling time noted to be 12-64mo, bladder outlet obstruction on Flomax...    ** had f/u Ortho DrDuda 2/12- insensate neuropathy w/ onychomycosis nails; and post tibial tendon insuffic w/ pronated valgus forefoot...    ** he is cared for by LMD in  [facility]> episode of confusion 9/11 reported by wife...  Current Problem List:  HYPERTENSION (ICD-401.9) - controlled on Rx w/ COREG 6.25Bid, HYDRALAZINE 10mg  taking 2tabsTid, ISOSORBIDE 10mg Tid, LASIX 40mg /d, + AMIODARONE  200mg - 1/2 tab daily... BP= 114/62 and tolerating meds well...  denies HA, visual changes, CP, palipit, dizziness, syncope, dyspnea, edema, etc...   CORONARY ARTERY DISEASE (ICD-414.00) - S/P CABG x8 in 1998 by DrWilson for 3vessel CAD... followed by DrJoe for yrs, now Print production planner... he gets his defib checked remotely on a regular basis...  ~  cath 2001 showed 50%LMain, native 3 vessel dis, grafts OK, EF= 45%...  ~  2DEcho 8/07 showed sl dil LV w/ EF= 20%, HK & AK diffusely, dil LA, mild AI, trivMR...  ~  adm by Cards 8/08 for CP felt to be non-cardiac...  ISCHEMIC CARDIOMYOPATHY (ICD-414.8) - he had defibrillator placed 2003 for VT... he is also on COUMADIN followed [facility]... he is followed regularly by DrTaylor...  2DEcho 8/07 w/ HK & AK, EF=20% (worse than 2006)...  ~  adm 3/07 by Cards w/ decomp CHF & he was diuresed w/ help of Nephrology.  ~  labs 6/10 showed BNP= 233  ~  5/11:  new cardioverter defibrillator placed by DrTaylor.  Hx of ATRIAL FLUTTER (ICD-427.32) - on Amio + Coumadin... S/P AICD 12/03...   PERIPHERAL VASCULAR DISEASE (ICD-443.9) - known mild carotid dis w/ 0-39% bilat, and mod right vertebral stenosis on CDoppler 12/08... normal Ao & iliacs 4/05 dopplers...  HYPERCHOLESTEROLEMIA (ICD-272.0) - on diet alone + FISH OIL ("per DrHecker- it's good for the eyes")...   ~  FLP 8/08 showed TChol 171, TG 150, HDL 51, LDL  90  ~  FLP 6/10 showed TChol 180, TG 180, HDL 52, LDL 92... continue fish oil, better low fat diet.  ~  FLP 3/11 showed TChol 190, TG 199, HDL 61, LDL 89  HYPOTHYROIDISM (ICD-244.9) - on LEVOTHYROID 177mcg/d...  ~  lab 8/08 w/ TSH= 1.51  ~  lab 6/10 showed TSH= 1.18... continue same.  ~  lab 3/11 showed TSH= 1.46  Hx of ISCHEMIC COLITIS (ICD-557.9) - occured after his CABG in 1998... resulted in total colectomy and ileostomy... long post-op course w/ grafted abd wound etc... ileostomy working well- no complaints...  RENAL INSUFFICIENCY, CHRONIC  (ICD-585.9) - followed by DrDeterding Q4-76months w/ labs etc... on CALCITRIOL 0.5mg /d and BICARB 2tabsBid for a RTA...  ~  labs from Nephrology 11/09 showed BUN= 50, Creat= 2.1  ~  labs from Nephrology 3/10 showed BUN= 34, Creat= 1.92, HCO3= 27  ~  labs 3/11 showed BUN= 35, Creat= 2.2  Hx of PROSTATE CANCER (ICD-185) & BENIGN PROSTATIC HYPERTROPHY, WITH OBSTRUCTION (ICD-600.01) - followed by [facility]OXWRUEA on [facility] 0.4mg /d...  History of prostate cancer in 2003 w/ XRT by DrGoodchild...  ~  pt indicates that DrDavis follows his PSA's...  ~  labs here 6/10 showed PSA=  3.74  ~  labs here 3/11 showed PSA= 5.46... copy to DrDavis.  DEGENERATIVE JOINT DISEASE, GENERALIZED (ICD-715.00) - he takes DCN100 Prn for pain...  DEMENTIA, MILD (ICD-294.8) - on ARICEPT 10mg /d...  ANXIETY (ICD-300.00) - on AMITRIPTYLINE 25mg Qhs for sleep, and ALPRAZOLAM 0.5mg  Prn for nerves...  Current Meds:  COUMADIN 5 MG TABS (WARFARIN SODIUM) Take as directed by coumadin clinic. ISOSORBIDE DINITRATE 10 MG  TABS (ISOSORBIDE DINITRATE) Take 1 tablet by mouth three times a day as directed... COREG 6.25 MG  TABS (CARVEDILOL) Take one tablet by mouth twice daily. HYDRALAZINE HCL 10 MG  TABS (HYDRALAZINE HCL) take 2 tablets by mouth every 8 hours PACERONE 200 MG TABS (AMIODARONE HCL) take 1/2 tab by mouth once daily or as directed FUROSEMIDE 40 MG  TABS (FUROSEMIDE) Take 2 tablets by mouth  every morning FISH OIL 1000 MG CAPS (OMEGA-3 FATTY ACIDS) 2 tabs daily LEVOTHYROXINE SODIUM 100 MCG TABS (LEVOTHYROXINE SODIUM) take 1 tab by mouth once daily... OMEPRAZOLE 40 MG CPDR (OMEPRAZOLE) one tablet by mouth once daily FLOMAX 0.4 MG  CP24 (TAMSULOSIN HCL) take 1 tab by mouth at bedtime as directed by DrDavis... CALCITRIOL 0.5 MCG  CAPS (CALCITRIOL) 1 tablets bid DrDeterding... SODIUM BICARBONATE 648 MG  TABS (SODIUM BICARBONATE (ANTACID)) take 2 two tabs twice daily as directed by DrDeterding... ARICEPT 10 MG  TABS (DONEPEZIL HCL)  Take 1 tablet by mouth once a day AMITRIPTYLINE HCL 25 MG  TABS (AMITRIPTYLINE HCL) take one tablet at bedtime ALPRAZOLAM 0.5 MG  TABS (ALPRAZOLAM) Take 1 tablet by mouth three times a day as needed anxiety MULTIVITAMINS  TABS (MULTIPLE VITAMIN) once daily NITROGLYCERIN 0.4 MG SUBL (NITROGLYCERIN) One tablet under tongue every 5 minutes as needed for chest pain---may repeat times three     Preventive Screening-Counseling & Management  Alcohol-Tobacco     Smoking Status: quit     Year Quit: 1953  Allergies: 1)  ! Penicillin 2)  ! Amoxicillin 3)  ! * Ivp Dye 4)  ! * Clindamycin  Comments:  Nurse/Medical Assistant: The patient's medications and allergies were reviewed with the patient and were updated in the Medication and Allergy Lists.  Past History:  Past Medical History: HYPERTENSION (ICD-401.9) CORONARY ARTERY DISEASE (ICD-414.00) ISCHEMIC CARDIOMYOPATHY (ICD-414.8) CHRONIC SYSTOLIC HEART FAILURE (ICD-428.22)  AUTOMATIC IMPLANTABLE CARDIAC DEFIBRILLATOR SITU (ICD-V45.02) ATRIAL FIBRILLATION (ICD-427.31) Hx of ATRIAL FLUTTER (ICD-427.32) PERIPHERAL VASCULAR DISEASE (ICD-443.9) HYPERCHOLESTEROLEMIA (ICD-272.0) HYPOTHYROIDISM (ICD-244.9) Hx of ISCHEMIC COLITIS (ICD-557.9) RENAL INSUFFICIENCY, CHRONIC (ICD-585.9) BENIGN PROSTATIC HYPERTROPHY, WITH OBSTRUCTION (ICD-600.01) Hx of PROSTATE CANCER (ICD-185) DEGENERATIVE JOINT DISEASE, GENERALIZED (ICD-715.00) DEMENTIA, MILD (ICD-294.8) ANXIETY (ICD-300.00) ANEMIA (ICD-285.9)  Past Surgical History: S/P CABG 1998 S/P total colectomy & ileostomy for ischemic colitis 1998  Family History: Reviewed history from 12/20/2009 and no changes required. Family History of Colon Cancer:Brother Family History of Esophageal Cancer:Brother Family History of Diabetes: Mother Family History of Heart Disease: Father  Social History: Reviewed history from 12/20/2009 and no changes required. Occupation: Retired [name] a former  smoker.  Alcohol Use - no Illicit Drug Use - no married to peggy Nicolaisen  Review of Systems      See HPI       The patient complains of decreased hearing, dyspnea on exertion, muscle weakness, and difficulty walking.  The patient denies anorexia, fever, weight loss, weight gain, vision loss, hoarseness, chest pain, syncope, peripheral edema, prolonged cough, headaches, hemoptysis, abdominal pain, melena, hematochezia, severe indigestion/heartburn, hematuria, incontinence, suspicious skin lesions, transient blindness, depression, unusual weight change, abnormal bleeding, enlarged lymph nodes, and angioedema.    Vital Signs:  Patient profile:   77 year old male Height:      66 inches Weight:      184.13 pounds O2 Sat:      96 % on Room air Temp:     96.2 degrees F oral Pulse rate:   53 / minute BP sitting:   138 / 72  (left arm) Cuff size:   regular  Vitals Entered By: Randell Loop CMA (October 11, 2010 11:40 AM)  O2 Sat at Rest %:  96 O2 Flow:  Room air CC: 6 month ROV & revew of mult medical problems... Is Patient Diabetic? No Pain Assessment Patient in pain? no      Comments meds updated today with pt   Physical Exam  Additional Exam:  WD, Overweight, 77 y/o WM in NAD... GENERAL:  Alert, pleasant & cooperative... HEENT:  [place]/AT, EOM-wnl, PERRLA, EACs-clear, TMs-wnl, NOSE-clear, THROAT-clear & wnl, sl dry MM's... NECK:  Supple w/ fairROM; no JVD; normal carotid impulses w/o bruits; no thyromegaly or nodules palpated; no lymphadenopathy. CHEST:  Clear to P & A; without wheezes/ rales/ or rhonchi heard... HEART:  Regular Rhythm, gr 1-2/6 SEM S4 gallop, no rubs or S3... ABDOMEN:  Ileotomy in right side- functioning normally, large abd wound/ graft/ defect- w/ binder... no organomegaly or masses. EXT: without deformities, mild arthritic changes; no varicose veins/ +venous insuffic/ tr edema. NEURO:  CN's intact; periph neuropathy bilat w/o focal deficits... DERM:  No lesions  noted; no rash etc...    Impression & Recommendations:  Problem # 1:  ISCHEMIC CARDIOMYOPATHY (ICD-414.8) He has HBP, CAD, Cardiomyopathy, hx AFib-Flutter &  followed by DrTaylor> his 1/12 note reviewed & stable, continue same meds and Coumadin (checked in [place] 7 called to the CC... His updated medication list for this problem includes:    Isosorbide Dinitrate 10 Mg Tabs (Isosorbide dinitrate) .Marland Kitchen... Take 1 tablet by mouth three times a day as directed...    Coreg 6.25 Mg Tabs (Carvedilol) .Marland Kitchen... Take one tablet by mouth twice daily.    Hydralazine Hcl 10 Mg Tabs (Hydralazine hcl) .Marland Kitchen... Take 2 tablets by mouth every 8 hours    Pacerone 200 Mg Tabs (Amiodarone hcl) .Marland Kitchen... Take 1/2 tab by mouth once daily  or as directed    Furosemide 40 Mg Tabs (Furosemide) .Marland Kitchen... Take 2 tablets by mouth  every morning    Nitroglycerin 0.4 Mg Subl (Nitroglycerin) ..... One tablet under tongue every 5 minutes as needed for chest pain---may repeat times three  Orders: TLB-BMP (Basic Metabolic Panel-BMET) (80048-METABOL) >> BUN 70, Creat 3.0 TLB-Hepatic/Liver Function Pnl (80076-HEPATIC) >> LFTs WNL TLB-Lipid Panel (80061-LIPID) >> TChol 179, TG 202, HDL 50, LDL 92 TLB-TSH (Thyroid Stimulating Hormone) (84443-TSH) >> 3.12  Problem # 2:  HYPERCHOLESTEROLEMIA (ICD-272.0) On diet + Fish Oil... FLP today ==>  TChol 179, TG 202, HDL 50, LDL 92   REC>  continue low fat diet & Fish Oil supplement...  Problem # 3:  HYPOTHYROIDISM (ICD-244.9) Stable on Levo 16mcg/d>  continue same. His updated medication list for this problem includes:    Levothyroxine Sodium 100 Mcg Tabs (Levothyroxine sodium) .Marland Kitchen... Take 1 tab by mouth once daily...  Problem # 4:  CHRONIC KIDNEY DISEASE STAGE IV (SEVERE) (ICD-585.4) Renal per DrDeterding w/ worsening Creat now= 3.0.Marland KitchenMarland Kitchen  Problem # 5:  Hx of PROSTATE CANCER (ICD-185) GU per DrWrenn w/ PSA doubling time  ~12-18 months...  Problem # 6:  DEGENERATIVE JOINT DISEASE, GENERALIZED  (ICD-715.00) Followed by DrDuda... not currently on pain meds other than Tylenol...  Problem # 7:  DEMENTIA, MILD (ICD-294.8) Cared for by wife & family in [place]... on Aricept 10mg /d.  Problem # 8:  OTHER MEDICAL PROBLEMS AS NOTED>>>  Complete Medication List: 1)  Coumadin 5 Mg Tabs (Warfarin sodium) .... Take as directed by coumadin clinic. 2)  Isosorbide Dinitrate 10 Mg Tabs (Isosorbide dinitrate) .... Take 1 tablet by mouth three times a day as directed... 3)  Coreg 6.25 Mg Tabs (Carvedilol) .... Take one tablet by mouth twice daily. 4)  Hydralazine Hcl 10 Mg Tabs (Hydralazine hcl) .... Take 2 tablets by mouth every 8 hours 5)  Pacerone 200 Mg Tabs (Amiodarone hcl) .... Take 1/2 tab by mouth once daily or as directed 6)  Furosemide 40 Mg Tabs (Furosemide) .... Take 2 tablets by mouth  every morning 7)  Fish Oil 1000 Mg Caps (Omega-3 fatty acids) .... 2 tabs daily 8)  Levothyroxine Sodium 100 Mcg Tabs (Levothyroxine sodium) .... Take 1 tab by mouth once daily.Marland KitchenMarland Kitchen 9)  Omeprazole 40 Mg Cpdr (Omeprazole) .... One tablet by mouth once daily 10)  Flomax 0.4 Mg Cp24 (Tamsulosin hcl) .... Take 1 tab by mouth at bedtime as directed by drdavis... 11)  Calcitriol 0.5 Mcg Caps (Calcitriol) .Marland Kitchen.. 1 tablets bid drdeterding... 12)  Sodium Bicarbonate 648 Mg Tabs (Sodium bicarbonate (antacid)) .... Take 2 two tabs twice daily as directed by drdeterding... 13)  Aricept 10 Mg Tabs (Donepezil hcl) .... Take 1 tablet by mouth once a day 14)  Amitriptyline Hcl 25 Mg Tabs (Amitriptyline hcl) .... Take one tablet at bedtime 15)  Alprazolam 0.5 Mg Tabs (Alprazolam) .... Take 1 tablet by mouth three times a day as needed anxiety 16)  Multivitamins Tabs (Multiple vitamin) .... Once daily 17)  Nitroglycerin 0.4 Mg Subl (Nitroglycerin) .... One tablet under tongue every 5 minutes as needed for chest pain---may repeat times three  Patient Instructions: 1)  Today we updated your med list- see below.... 2)   Continue your current meds the same... 3)  Today we did your follow up  FASTING blood work... please call the "phone tree" in a few days for your lab results.Marland KitchenMarland Kitchen 4)  Call for any problems.Marland KitchenMarland Kitchen 5)  Please schedule a follow-up appointment in  6 months, sooner as needed.   Immunization History:  Influenza Immunization History:    Influenza:  historical (05/23/2010)

## 2010-10-18 NOTE — Letter (Signed)
Summary: [facility]  [facility]   Imported By: Sherian Rein 10/11/2010 15:30:00  _____________________________________________________________________  External Attachment:    Type:   Image     Comment:   External Document

## 2010-10-18 NOTE — Letter (Signed)
Summary: [facility] Patient Note   [facility] Patient Note   Imported By: Roderic Ovens 10/12/2010 11:13:28  _____________________________________________________________________  External Attachment:    Type:   Image     Comment:   External Document

## 2010-10-20 ENCOUNTER — Encounter: Payer: Self-pay | Admitting: Internal Medicine

## 2010-10-20 LAB — CONVERTED CEMR LAB: POC INR: 2

## 2010-10-24 LAB — PROTIME-INR: Prothrombin Time: 27.4 seconds — ABNORMAL HIGH (ref 11.6–15.2)

## 2010-10-24 LAB — SURGICAL PCR SCREEN
MRSA, PCR: NEGATIVE
Staphylococcus aureus: POSITIVE — AB

## 2010-10-25 NOTE — Medication Information (Signed)
Summary: Coumadin Clinic   Anticoagulant Therapy  Managed by: Windell Hummingbird, RN Referring MD: Sharrell Ku PCP: Alroy Dust, MD Supervising MD: Johney Frame MD, Fayrene Fearing Indication 1: Atrial Fibrillation Indication 2: Coronary Artery Disease Lab Used: [facility] Site: [address] INR POC 2.0 INR RANGE 2.0-3.0  Dietary changes: no    Health status changes: no    Bleeding/hemorrhagic complications: no    Recent/future hospitalizations: no    Any changes in medication regimen? no    Recent/future dental: no  Any missed doses?: no       Is patient compliant with meds? yes       Allergies: 1)  ! Penicillin 2)  ! Amoxicillin 3)  ! * Ivp Dye 4)  ! * Clindamycin  Anticoagulation Management History:      The patient is taking warfarin and comes in today for a routine follow up visit.  Positive risk factors for bleeding include an age of 75 years or older and presence of serious comorbidities.  The bleeding index is 'intermediate risk'.  Positive CHADS2 values include History of CHF, History of HTN, and Age > 6 years old.  The start date was 09/13/2006.  His last INR was 3.43.  Anticoagulation responsible provider: Rodel Glaspy MD, Fayrene Fearing.  INR POC: 2.0.    Anticoagulation Management Assessment/Plan:      The patient's current anticoagulation dose is Coumadin 5 mg tabs: Take as directed by coumadin clinic..  The target INR is 2.0-3.0.  The next INR is due 11/17/2010.  Anticoagulation instructions were given to spouse.  Results were reviewed/authorized by Windell Hummingbird, RN.  He was notified by Windell Hummingbird, RN.         Prior Anticoagulation Instructions: INR 2.11 Continue 5mg s everyday except 2.5mg  on Mondays. Recheck in 4 weeks.   Current Anticoagulation Instructions: INR 2.0 Continue taking 1 tablet every day, except take 1/2 tablet on Mondays. Recheck in 4 weeks.  Spoke w/ patient's wife.  Already has appt. for INR to be drawn in 4 weeks. Windell Hummingbird  October 20, 2010 3:50 PM

## 2010-11-15 ENCOUNTER — Other Ambulatory Visit: Payer: Self-pay | Admitting: Internal Medicine

## 2010-11-29 ENCOUNTER — Ambulatory Visit: Payer: Self-pay | Admitting: Cardiology

## 2010-12-01 ENCOUNTER — Ambulatory Visit (INDEPENDENT_AMBULATORY_CARE_PROVIDER_SITE_OTHER): Payer: Medicare Other | Admitting: *Deleted

## 2010-12-01 DIAGNOSIS — I472 Ventricular tachycardia: Secondary | ICD-10-CM

## 2010-12-01 DIAGNOSIS — I4891 Unspecified atrial fibrillation: Secondary | ICD-10-CM

## 2010-12-11 NOTE — Progress Notes (Signed)
icd remote check  

## 2010-12-20 NOTE — Assessment & Plan Note (Signed)
[facility]                         ELECTROPHYSIOLOGY OFFICE NOTE   NAME:DANIELSJhamal, Plucinski                      MRN:          [id number]  DATE:05/15/2007                            DOB:          October 29, 1924    Mr. Straka returns today for followup.  He is a very pleasant elderly  man with a multitude of medical problems, including ischemic  cardiomyopathy, congestive heart failure, ischemic heart disease, atrial  fibrillation, and prostate problems.  He is status post ICD insertion.   MEDICATIONS:  1. Amiodarone 100 mg daily.  2. Coreg 12.5 mg b.i.d.  3. Hydralazine 10 mg t.i.d.  4. Isosorbide 10 mg daily.  5. Furosemide 40 a day.  6. Warfarin.  7. Aricept.  8. Levoxyl.  9. Amitriptyline.  10.Flomax, I believe at 0.4 mg daily.   He returns today for followup.  Since I saw him last, he has been  amazingly stable.  He denied chest pain.  His heart failure is class II.  He has chronic peripheral edema, approximately 1+.   PHYSICAL EXAMINATION:  GENERAL:  He is a pleasant chronically ill-  appearing man in no distress.  VITAL SIGNS:  Blood pressure 150/76, pulse 70 and regular, respirations  16.  Weight 193 pounds.  NECK:  No jugular venous distension.  LUNGS:  Clear bilaterally to auscultation.  No wheezing, rhonchi, or  rales.  CARDIOVASCULAR:  Regular rate and rhythm with normal S1 and S2.  EXTREMITIES:  1+ peripheral edema bilaterally.   IMPRESSION:  1. Ischemic cardiomyopathy with severe left ventricular dysfunction      with an ejection fraction of 20%.  2. Congestive heart failure.  3. History of atrial flutter status post ablation, also with a history      of atrial fibrillation.  4. History of prostate problems.  5. Some dementia.   DISCUSSION:  Mr. Digirolamo' defibrillator is working normally.  His P and  R waves were 2 and 21, respectively.  His impedances were in the 700  range, and his threshold was 1.2 at 0.5 in the  atrium  and 0.8 at 0.5 in the right ventricle.  His battery voltage was  2.61 (MOL 2).  I will plan to  see the patient back in the office in several months.  He is approaching  but not yet at elective replacement indication on his defibrillator.     Doylene Canning. Ladona Ridgel, MD  Electronically Signed    GWT/MedQ  DD: 05/15/2007  DT: 05/16/2007  Job #: [id number]

## 2010-12-20 NOTE — Assessment & Plan Note (Signed)
[facility]                         ELECTROPHYSIOLOGY OFFICE NOTE   NAME:DANIELSJovanni, [name]                      MRN:          [id number]  DATE:02/13/2008                            DOB:          03/15/1925    [name] returns today for followup.  He is a very pleasant 77-year-  old male with a history of ischemic cardiomyopathy, congestive heart  failure, chronic renal insufficiency, hypertension, and remotely atrial  flutter.  He returns today for followup.  He has had no intercurrent IC  therapies.  He does have mild peripheral edema.   CURRENT MEDICATIONS:  1. Flomax 0.4 a day.  2. Amiodarone 100 a day.  3. Coreg 6.25 twice daily.  4. Hydralazine 20 three times a day.  5. Isosorbide 10 q.8.  6. Furosemide 40 daily.  7. Warfarin as directed.  8. Aricept 10 a day.  9. Levoxyl 100 mcg daily.  10.Amitriptyline 25 nightly.   PHYSICAL EXAMINATION:  GENERAL:  He is a pleasant elderly man in no  acute distress.  VITAL SIGNS:  Blood pressure today was 123/62.  The pulse was 62 and  regular.  The Weight was 190 pounds.  NECK:  No jugular venous distention.  CARDIAC:  Regular rate and rhythm with normal S1 and S2.  There was a  soft systolic murmur at left lower sternal border.  ABDOMINAL:  Obese, nontender, and nondistended.  EXTREMITIES:  Trace peripheral edema bilaterally.   Interrogation of defibrillator demonstrates a Guidant Prizm 2 implanted  in December 2003.  The P-waves were 4.  The R-waves 15.  The impedance  694 in the A, 677 in the V.  The threshold 1.2 at 0.5 in the A, 0.8 at  0.5 in the V.  Battery voltage was 2.58 volts (MOL2).  There was 1  episode of categorized as VT which was treated with antitachycardia  pacing, but I suspect was simply atrial fibrillation/flutter with a  rapid ventricular response.  He was not V pacing.   IMPRESSION:  1. Nonischemic cardiomyopathy.  2. Congestive heart failure.  3. Paroxysmal atrial  arrhythmias.  4. Chronic Coumadin therapy.  5. Chronic amiodarone therapy.   DISCUSSION:  Overall, [name] is stable from an arrhythmia  perspective and he is maintaining a sinus rhythm the majority of the  time.  His defibrillator is working normally.  I have  recommended that he continue his current medical therapy.  I will plan  to see the patient back in the office for ICD followup in a year.  He  will follow up in our [facility] program as he currently is.     Doylene Canning. Ladona Ridgel, MD  Electronically Signed    GWT/MedQ  DD: 02/13/2008  DT: 02/14/2008  Job #: [id number]

## 2010-12-20 NOTE — Assessment & Plan Note (Signed)
[name]                         ELECTROPHYSIOLOGY OFFICE NOTE   NAME:DANIELSHamsa, [name]                      MRN:          [id number]  DATE:02/25/2007                            DOB:          07-11-1925    [name] is a very pleasant middle aged male with an ischemic  cardiomyopathy, congestive heart failure, ischemic heart disease, atrial  fibrillation, chronic Coumadin therapy as well as prostate problems, who  returns today for followup.  He has a history of ICD implantation.  His  medications include:  1. Flomax.  2. Amiodarone 100 daily.  3. Coreg 6.25, 2 tabs 1 tab twice daily.  4. Hydralazine 10 mg t.i.d.  5. Isosorbide 10 daily.  6. Furosemide 40 daily.  7. Warfarin 5 every other day and as directed.  8. Aricept 10 daily.  9. Levoxyl.  10.Amitriptyline 25.   PHYSICAL EXAMINATION:  .  He is a pleasant, middle age man in no distress.  Blood pressure was 146/70.  The pulse was 60 and regular.  Respirations  were 18.  The weight was 197 pounds.  NECK:  No jugular venous distention.  No thyromegaly.  The trachea was  midline.  LUNGS:  Clear bilaterally to auscultation.  No wheezes, rales, or  rhonchi.  CARDIOVASCULAR:  Regular rate and rhythm with normal S1 and S2.  I did  not appreciate any murmurs, gallops, or rubs.  EXTREMITIES:  Trace peripheral edema bilaterally.   IMPRESSION:  1. Ischemic cardiomyopathy.  2. Congestive heart failure.  3. History of atrial flutter.  4. Hypertension.  5. Prostate problems.  6. Some dementia, on Aricept.   DISCUSSION:  Overall, [name] multiple medical problems are stable.  His heart failure is well controlled.  He will continue on his present  medical therapy, and we will plan to see him  back in the office in 6 months.  Today, I reviewed his labs which  demonstrate satisfactory lipid numbers.     Doylene Canning. Ladona Ridgel, MD  Electronically Signed    GWT/MedQ  DD: 02/25/2007  DT:  02/25/2007  Job #: [id number]

## 2010-12-20 NOTE — Discharge Summary (Signed)
NAMEFERNAND, [name] NO.:  [id number]   MEDICAL RECORD NO.:  [id number]          PATIENT TYPE:  INP   LOCATION:  3707                         FACILITY:  [facility]   PHYSICIAN:  Doylene Canning. Ladona Ridgel, MD    DATE OF BIRTH:  12-08-1924   DATE OF ADMISSION:  03/12/2007  DATE OF DISCHARGE:  03/13/2007                               DISCHARGE SUMMARY   ALLERGIES:  1. CLINDAMYCIN.  2. PENICILLIN.  3. CEPHALOSPORINS.  4. AUGMENTIN.   TIME FOR THIS DISCHARGE:  Greater than 35 minutes.   FINAL DIAGNOSES:  1. Admitted with chest pain.      a.     He was ruled out for acute myocardial infarction by serial       troponin I studies.      b.     Musculoskeletal chest pain this admission, worse with chest       torsion, worse on palpation.      c.     No evidence of superficial herpes zoster rash or lesion.  2. The patient discharged with Duragesic patch 25 mcg per hour      transdermal.   SECONDARY DIAGNOSES:  1. Ischemic cardiomyopathy, ejection fraction 20% at 2-D      echocardiogram in 2007.  2. Three-vessel coronary artery disease status post coronary artery      bypass graft surgery in 1999.  3. Paroxysmal atrial fibrillation now in sinus rhythm on amiodarone      and Coumadin.  4. Chronic renal insufficiency.  Creatinine 1.79 this admission; his      normal range is 1.7 to 2.2.  5. Implant cardioverter-defibrillator in December 2003 for inducible      monomorphic ventricular tachycardia.  6. Hypertension.  7. History of ischemic colitis status post total colectomy and      ileostomy.  This was apparently done in the recovery period post      coronary artery bypass graft surgery.  8. History of acute-on-chronic systolic congestive heart failure      decompensation when his weight had gotten to 215.  His weight this      admission is 194.  Ideal dry weight is 185 per patient.  9. New York Heart Association class II chronic systolic congestive      heart failure.   PROCEDURES THIS ADMISSION:  1. March 12, 2007 - interrogation of Guidant Wilder cardioverter      defibrillator.  Lower rate is 40.  VT rate is 160.  VF rate is 200,      no episodes stored.  Normal device function.  2. Chest x-ray - no evidence of left-sided rib fracture.  Lower lung      fields show atelectasis versus scarring.   BRIEF HISTORY:  [name] is an 77 year old male transferred from  [facility] early in the evening.  The patient has had left-sided  chest pain since 3 days prior to his admission.  Had no radiation.  It  was worse with movement and painful with palpation.  The patient had  helped his grandson lift a tractor and  had engaged in heavy twisting and  lifting.  He is not short of breath, no nausea, vomiting or diaphoresis.  He tried alprazolam which helped a little bit, and he started Darvocet  which helped a little bit.  On presentation to [facility], it was  suggested because of his extensive cardiac history that he transfer to  [facility].  He transferred to [facility] on August 5.  Cardiac  enzymes were 0.04, then 0.2, then 0.03.  The patient's pain had  diminished a fair amount since his admission.  He has been treated with  oral analgesics with some success.  Chest x-ray showed no evidence of  rib fractures.  ESR is pending at the time of discharge, as well as the  angiotensin converting enzyme level.  His total cholesterol is 171,  triglycerides 150, HDL cholesterol 51, LDL cholesterol of 90.  His pro  time is 35.2, INR of 3.3 on his home Coumadin dose.  The patient is  achieving 95% oxygen saturation on room air on the day of discharge.  He  is not having any trouble breathing.  His chest pain, as mentioned  above, is mainly resolved.  Chest x-ray shows no evidence of failure,  and, as mentioned above, interrogation of the device shows no events and  all values within normal limits.  He was seen prior to discharge by Dr.   [name] who prescribed a Duragesic patch 25 mcg per hour to be changed  every 3 days.  Instructions for follow up have also been given.   DISCHARGE MEDICATIONS:  1. Flomax 0.4 mg every evening.  2. Amiodarone 100 mg daily.  3. Coreg 6.25 mg p.o. b.i.d.  4. Hydralazine 10 mg q.8h.  5. Isosorbide dinitrate 10 mg every 8 hours.  6. Lasix 40 mg daily.  7. Coumadin 2.5 mg daily except 5 mg Monday, Wednesday, Friday.  8. Calcitriol 0.5 mcg daily.  9. Aricept 10 mg daily.  10.Levoxyl 100 mcg daily.  11.Amitriptyline 25 mg daily at bedtime.  12.Alprazolam 0.5 mg as needed.  13.Sodium bicarbonate tablets 650 mg 2 tablets twice daily.   LABORATORY DATA:  Complete blood count - white cells are 8.4, hemoglobin  13, hematocrit 39, platelets of 178.  Sodium is 134, potassium is 4.4,  chloride 101, carbonate 21, BUN is 39, creatinine 1.79 and glucose is  91.   Greater than 35 minutes.      Maple Mirza, PA      Doylene Canning. Ladona Ridgel, MD  Electronically Signed    GM/MEDQ  D:  03/13/2007  T:  03/13/2007  Job:  [id number]   cc:   Lonzo Cloud. [name], MD  Doylene Canning. Ladona Ridgel, MD

## 2010-12-20 NOTE — Assessment & Plan Note (Signed)
[facility]                                 ON-CALL NOTE   NAME:[name], [name]                      MRN:          [id number]  DATE:03/12/2007                            DOB:          10/10/1924    Time of call is 3:10 a.m.  I had received a phone call from Mr. Schoenberg'  wife stating that he had been having pains in his chest since after  eating dinner.  He said that the pain had been constant and was  substernal and on his left side. He describes it as a heavy pain and  that it was worse when he would take a deep breath. He had tried taking  3 sublingual nitroglycerines approximately a half hour ago without any  significant benefit. His wife says she thinks he has been coming down  with a cold for the last several days and he has been having a cough  with production of yellow sputum. He denies any hemoptysis. He has not  had any fevers, chills, or sweats. He denies any dizziness, abdominal  pain, nausea, vomiting, or diarrhea. His wife had checked his blood  pressure at home and says that it was 135/65. He is having some  tenderness on palpation per his wife. What I have advised Mr. Miner to  do is to proceed to [facility], which will be the closest  facility to him for further evaluation given his history of significant  cardiac disease. I discussed with them that it is possible that this  could be cardiac related, although it is also possible that he could be  developing a pneumonia as well. I have advised him that it would be best  to proceed to the closest facility for at least an initial evaluation.  Then depending upon this, if necessary arrangements could be made to  have him transferred to [facility] for further care. Mrs.  Olivero says that she is in agreement with this plan and she says that  she will make arrangements to call the ambulance to have him brought to  [facility].     Coralyn Helling, MD  Electronically  Signed    VS/MedQ  DD: 03/12/2007  DT: 03/12/2007  Job #: [id number]   cc:   Lonzo Cloud. [name], MD

## 2010-12-20 NOTE — H&P (Signed)
NAME:  [name], [name] NO.:  [id number]   MEDICAL RECORD NO.:  [id number]          PATIENT TYPE:  INP   LOCATION:  3707                         FACILITY:  [facility]   PHYSICIAN:  Dr. Myrtis Ser               DATE OF BIRTH:  December 03, 1924   DATE OF ADMISSION:  03/12/2007  DATE OF DISCHARGE:                              HISTORY & PHYSICAL   BRIEF HISTORY:  Mr. Errico is an 77 year old white male who was  transferred from [facility] to [facility] for further  evaluation of chest discomfort.  He was accepted by Dr. Freida Busman.  Mr.  Reuel Boom states since Friday evening he has noted a left-sided anterior  chest pain.  He can not describe this discomfort, although he does  describe a pleuritic component and is adamant that the discomfort is  worse with movement.  It does not radiate.  He denies any associated  shortness of breath, nausea, vomiting or diaphoresis.  He does state  Wednesday or Thursday he had lifted a trailer, which he does not usually  do.  He denies any accidents or injuries.  On Saturday evening he did  take some alprazolam which reduced the discomfort. and on Monday night  he had taken some Darvocet that he had at his home, which also reduce  the discomfort.  He states that the last time it was 0 was prior to its  onset.   Mr. Rosann Auerbach family had talked to Dr. [name] some time over the weekend  and he was referred to the emergency room.  Due to his inability to  sleep last night because of increasing discomfort, he did go to [facility]  Apparently, in transit by [facility], he received some  morphine which significantly reduced his discomfort.   ALLERGIES:  CLINDAMYCIN, PENICILLIN, CEPHALOSPORINS AND AUGMENTATION.   MEDICATIONS:  Prior to admission include:  1. Flomax 0.4 mg p.o. nightly.  2. Amiodarone 100 mg p.o. daily.  3. Coreg 6.25 mg b.i.d.  4. Hydralazine 10 mg q.8 hours.  5. Isordil 10 mg q.8 hours.  6. Lasix 40 mg daily.  7.  Warfarin 2.5 mg daily except 5 mg Monday, Wednesday and Friday.  8. Calcitriol 0.55 mcg daily.  9. Aricept 10 mg daily.  10.Levoxyl 100 mcg daily.  11.Amitriptyline 25 mg nightly.  12.Alprazolam 0.5 mg half a tab to 1 tab as needed.  13.Sodium bicarbonate 650 mg 2 tablets b.i.d.   PAST MEDICAL HISTORY:  Notable for 4-vessel bypass surgery in 1999 with  a LIMA to the diagonal, saphenous vein graft to the RCA, and a Y-graft  saphenous vein to the OM-2 and diagonal 1.  Ischemic cardiomyopathy with  history of systolic CHF.  Last echocardiogram on April 04, 2006 showed  EF 20%, apical and inferior septal akinesis, septal hypokinesis,  posterior dyskinesis, bilateral atrial enlargement, mild AI,  mild RVH .  Last cath on October 06, 1999 showed native 3-vessel coronary disease.  LIMA to the diagonal and saphenous vein graft to the RCA was patent.  EF  45%. -The saphenous vein graft to the OM-2 and diagonal had a 30% mid  lesion end-spasm of 60%.  Last adenosine Myoview on November 02, 2005  showed EF of 15%, anteroseptal/inferolateral scarring with very small  amount of peri-infarct ischemia, EF of 15%.  His history is also notable  for atrial fibrillation with amiodarone therapy.  Anticoagulation is  regulated by Dr. Lubertha Basque office.  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  Baseline creatinine is 1.7 to 2.2.  Hyperlipidemia secondary hyperparathyroidism.  COPD.  Ischemic colitis,  status post total colectomy and ileostomy in 1988.  Chronic left bundle  branch block.  Nonsustained V tach when inducible monomorphic V tach on  the EP study requiring Guidant prism ICD on July 22, 2002.  Unclear  of last interrogation.  He has a history of prostate cancer.   SOCIAL HISTORY:  He resides in [place] with his wife.  He has one  daughter.  He has not smoked since 1953, has not had any alcohol  since  1990s.  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  He  denies any drugs, herbal medications.  He remains very active doing yard  work and taking walks.  He maintains a no-salt diet.  Both parents are  deceased at the age of 23 with a myocardial infarction.  He has 2  sisters that are both deceased with coronary artery disease.   REVIEW OF SYSTEMS:  In addition to the above, is notable for reading  glasses, hearing loss, dentures.  He recently had a lesion removed on  his left hand.  Pathology is not available, as that the patient is not  checked in back with his physician.  Occasional nocturia.  Numbness in  his toes.  Denies any GI difficulties with his ostomy bag.   PHYSICAL EXAM:  GENERAL:  Well-nourished, well-developed, pleasant,  obese white male in no apparent distress.  Temperature is 96.7, blood pressure 131/70, pulse 50 and regular,  respirations 18, weight is 89.7, 97% sat on 2 L.  HEENT:  Unremarkable except for dentures.  NECK:  Supple without thyromegaly, adenopathy, JVD or carotid bruits.  CHEST:  Symmetrical excursion.  LUNGS:  Clear to auscultation with decreased breath sounds.  HEART:  PMI is not displaced.  He has distant heart sounds but with a  regular rate and rhythm.  I do not appreciate any murmurs, rubs, clicks  or gallops.  All pulses are symmetrical and intact, and I do not  appreciate any abdominal or femoral bruits.  SKIN:  Integument appears intact; however, he does have a dressing on  his left hand from recent lesion removal. ABDOMEN:  Obese.  Bowel sounds  present without organomegaly, masses or tenderness.  Colostomy bag is  intact.  He does have extensive scarring, but intestines are easily  reducible as if there is no mesh underneath the scarring.  EXTREMITIES:  Negative cyanosis, clubbing or edema.  He does have bilateral lower  extremity varicosities.  MUSCULOSKELETAL:  Grossly unremarkable.  His  discomfort is reproducible  when he extends both arms back and when he  pulls an object toward him.  NEURO:  Grossly unremarkable.  His discomfort is also reproduced with  palpation.   Chest x-ray:  From [facility] copy is uninterpretable.  EKG from [place]  shows normal sinus rhythm, left bundle branch block, first-degree AV  block with a rate of 60; unchanged  from prior EKGs.  BNP is 2590, D-  dimer was negative.  H&H was 13 and 39, MCV was elevated at 98,  platelets 178, WBCs 8.4, sodium 134, potassium 4.4, BUN 39, creatinine  1.79, glucose 91.  Normal LFTs.  Initial CK-MB and troponin at [facility]  are within normal limits.  PTT is 51.  PT is 35.1 with an INR of 3.6.   IMPRESSION:  1. Prolonged chest discomfort, probable musculoskeletal given history      and exam.  2. History is listed per past medical history.   DISPOSITION:  Dr. Myrtis Ser reviewed the patient's history, spoke with and  examined the patient, and agrees with the above.  We will admit him and  observe him overnight to rule out myocardial infarction.  However, he  does not have any evidence of ischemia or heart failure by history or  exam.  We obtained a chest x-ray report from [place]; however, we will  repeat a chest x-ray here specifically to evaluate his left rib  discomfort that is reproducible on  palpation.  We will continue his home medications.  Ask Dr. [name] to  become involved for his pain management given his renal insufficiency.  We will order p.r.n. morphine.  If he rules out for myocardial  infarction in his discomfort is controlled, anticipate discharge in the  morning.      Joellyn Rued, PA-C    ______________________________  Dr. Myrtis Ser    EW/MEDQ  D:  03/12/2007  T:  03/12/2007  Job:  [id number]   cc:   Lonzo Cloud. [name], MD  Doylene Canning. Ladona Ridgel, MD  Genene Churn. Cyndie Chime, M.D.  [name]

## 2010-12-23 NOTE — Discharge Summary (Signed)
[name], [name] NO.:  [id number]   MEDICAL RECORD NO.:  [id number]          PATIENT TYPE:  INP   LOCATION:  1613                         FACILITY:  [facility]   PHYSICIAN:  Lonzo Cloud. [name], M.D. [facility] OF BIRTH:  03-13-1925   DATE OF ADMISSION:  09/08/2005  DATE OF DISCHARGE:  09/14/2005                                 DISCHARGE SUMMARY   FINAL DIAGNOSES:  1.  Admitted September 08, 2005, with a one-week history of pain and swelling      in the left great toe and dorsal foot.  He had treated by a podiatrist      with lancing of a blister, p.o. Augmentin, and dressing changes.  A      cellulitis of the left foot and was diagnosed with prominent drug      eruption likely due to the Augmentin.  Orthopedic consult, [name],      with outpatient follow-up planned; infectious disease consult, Dr.      Roxan Hockey, with recommendation to discontinue antibiotics, continue      dressing changes and observe.  2.  Cellulitis, left foot, as above.  He was started on IV Kefzol during the      hospitalization, but rash progressed and he was switched to p.o.      Cleocin.  [name] recommended discontinuing all antibiotics and      observing.  It is hoped that antibiotics has had to date may have      effectively treated his underlying infection.  3.  Prominent drug eruption likely due to oral Augmentin.  The patient's      wife was quite concerned that the Kefzol and then the Cleocin also      caused problems and refused to let him take any more antibiotics.  4.  History of asthmatic bronchitis, he is an ex-smoker.  5.  History of hypertension, controlled on medications.  6.  History of coronary artery disease with previous myocardial infarction      and eight-vessel coronary artery bypass grafting in 1998 by [name].      He is followed [name], M.D.  7.  Ischemic cardiomyopathy with last catheterization in 2001 showing      ejection fraction of 45%; 2-D echo  in December 2006 showed ejection      fraction 30% with diffuse hypokinesis.  8.  Cardiac arrhythmia with history of paroxysmal atrial flutter and left      bundle branch block pattern, status post automatic implantable      cardioverter-defibrillator placement.  9.  Arteriosclerotic peripheral vascular disease with previous right basal      ganglionic infarct and cerebral atrophy on CT.  10. History of ischemic colitis in 1998 during his long hospitalization,      with total colectomy and ileostomy.  11. History of renal insufficiency with creatinine in the 1.8-2.0 range.  He      is followed [name] and has a renal tubular acidosis and secondary      hyperparathyroidism and mild anemia.  12. History of prostate cancer in  2003 with radiation therapy by [name]      and [name].  13. History of hypercholesterolemia controlled on diet alone.  The patient      has refused statins.  14. Hypothyroidism, controlled on Synthroid.  15. History of anxiety and early Alzheimer's disease, on Aricept and      alprazolam as needed.   BRIEF HISTORY AND PHYSICAL:  The patient is a 77 year old white man known to  be with multiple medical issues as noted above.  He presented to the office  on the day of admission with a one-week history of pain and swelling in the  left great toe and dorsal foot.  He noted swelling and bleeding and a  blister with some drainage after walking in shoes that did not quite fit on  the weekend prior to admission.  He developed a blister on left great toe  with some serous drainage, without pus or foul smell at the time.  He went  to a podiatrist, who lanced the blister and dressed the toe and started him  on p.o. Augmentin.  The patient notes he has been intolerant to the  antibiotic with a prominent rash and progressive changes with eschar on the  toe with yellow drainage, redness and soreness extending up the foot.  In  the office he had a temperature of 99  degrees with cellulitis in the dorsal  foot and left great toe area.  He was admitted for IV antibiotics,  orthopedic consultant hydrotherapy.   PAST MEDICAL HISTORY:  The patient's last hospitalization was in September  2005 with renal insufficiency.  Please see that discharge summary.  He has a  history of asthmatic bronchitis and is an ex-smoker.  He has a history of  hypertension which is controlled on medications.  He has a history of  coronary artery disease with previous myocardial infarction and a subsequent  eight-vessel coronary bypass graft 1998 by [name].  He is followed  regularly by Dr. Glennon Hamilton for cardiology.  He has an ischemic  cardiomyopathy with catheterization 2001 that showed ejection fraction  around 45%.  He had a 2-D echo in December 2006 that showed ejection  fraction around 30% with diffuse hypokinesis.  He has cardiac arrhythmias  with paroxysmal atrial flutter and a left bundle branch block pattern on his  EKG and an AICD placed in the past.  He has a history of arteriosclerotic  peripheral vascular disease with previous right basal ganglionic infarct and  cerebral atrophy, which he is taking Aricept.  He had carotid Dopplers in  December 2006 that showed a 0-39% obstruction bilaterally in the internal  carotids.  He has known small-vessel disease on his previous MRI.  He had an  abdominal aortic ultrasound in 2005 that was normal.  His ABIs were not  checked.  He has a history of ischemic colitis during his long cardiac  hospitalization in 1998 that resulted in the need for a total colectomy and  an ileostomy.  He has a history of renal insufficiency with creatinines in  the 1.8-2.0 range, and he is followed regularly by [name] for  nephrology.  He has renal tubular acidosis and is on bicarb tablets and  secondary hyperparathyroidism on Calciferol.  He has a history of prostate cancer in 2003, evaluated by Dr. Earlene Plater and treated with radiation  therapy by  Dr. Dan Humphreys.  He has a history of hypercholesterolemia but has refused  statins and controls this with diet alone.  He has a history of  hypothyroidism controlled on Synthroid.  He has a history of early  Alzheimer's disease on Aricept.  He has had moderate anxiety and takes a  small dose of alprazolam as needed.   PHYSICAL EXAMINATION:  GENERAL:  Physical examination at the time of  admission revealed a 77 year old gentleman, chronically ill-appearing, in no  acute distress.  VITAL SIGNS:  Blood pressure 150/80, pulse 64 per minute and regular,  respirations 20 per minute and not labored, O2 saturation 98% on room air.  Temperature was 99.4 orally.  HEENT:  Negative.  There were no exudates seen.  NECK:  No jugular distension, no carotid bruits, no thyromegaly or  lymphadenopathy.  RESPIRATORY:  Decreased breath sounds and a few scattered rhonchi.  There  were no wheezes, rales or signs of consolidation.  CARDIAC:  A regular rhythm, a grade 1/6 systolic ejection murmurs along the  left sternal border.  No rubs or gallops detected.  ABDOMEN:  The abdomen revealed his ileostomy and scar of previous plastic  surgery for wound closure.  Bowel sounds were intact.  The abdomen was soft  and nontender, no evidence organomegaly or masses.  GENITOURINARY:  no lesions.  EXTREMITIES:  Extremities showed the left foot to be swollen, red and  inflamed, with a blister and eschar and some drainage from the great toe.  NEUROLOGIC:  Mild dementia, no focal abnormalities.  There is a moderate  neuropathy with decreased sensation in his lower extremities.  SKIN:  A prominent rash compatible with a drug eruption.   LABORATORY DATA:  EKG showed sinus rhythm, left bundle branch block, first-  degree AV block and nonspecific ST-T wave changes.  Chest x-ray  revealed  cardiomegaly and some vascular congestion with no edema, underlying COPD  with no acute changes.  X-ray of the left foot  showed a soft tissue swelling  extending into the great toe, no radiographic evidence for osteomyelitis.  CBC showed hemoglobin 14.0, hematocrit 41.4, white count 6400 with 68% segs.  Subsequent counts revealed a white count up to 15,00 with 92% segs.  Sedimentation rate was 53.  Sodium 131, potassium 4.1, chloride 103, CO2 23,  BUN 28, creatinine 1.6, blood sugar 88, calcium 8.6, total protein 7.4,  albumin 3.1, AST 26, ALT 19, alkaline phosphatase 47, total bilirubin 0.7.  BNP 708.  TSH of 4.61.  PSA 0.82.  Blood cultures negative x2.  Wound  culture was no growth.  Urine culture was no growth.  An ASO titer was  checked and was negative.   HOSPITAL COURSE:  The patient was admitted with a cellulitis of the left  foot and was placed on IV Keflex along with IV fluids.  His home medications  were continued.  He was seen by [name] for orthopedics.  He felt that and continued antibiotic treatment and hydrotherapy was indicated.  He indicated  that he wanted to follow up the patient in the office and will obtain proper  shoe fitting for him.  The patient's rash was quite prominent, and it was  felt that it was due to the Augmentin.  Wife became quite concerned that the  Keflex was causing the problem, and he was switched to Cleocin.  Then she  became quite concerned that the Cleocin may be doing it as well, and all  antibiotics were stopped and ID consult was obtained.  The patient was seen  by Dr. Burnice Logan, who agreed with the diagnosis of a drug  eruption most  likely due to the Augmentin but we could not rule out contributions from the  other antibiotics, so all antibiotics were held.  He was continued on  hydrotherapy for the toe and dressing changes.  [name] continued to  follow the patient and recommended continuing to hold antibiotics, feeling  that the antibiotic course to date had been successful in eliminating any  underlying cellulitis.  He will continue on dressing  changes at home and  follow up with myself and [name].   MEDICATIONS AT DISCHARGE:  1.  Lidex-E cream, apply to rash twice daily as needed.  2.  Xopenex metered-dose inhaler two sprays three to four times a day via      Aerochamber.  3.  Mucinex 600 mg tablets two tablets p.o. b.i.d. with plenty of fluids.  4.  Benadryl 25 mg p.o. q.4h. p.r.n. itching.  5.  Pacerone 200 mg tablets, one-half tablet p.o. q.d.  6.  Flomax 0.4 mg p.o. q.d.  7.  Levoxyl 75 mcg p.o. q.d.  8.  Amitriptyline 25 mg p.o. q.d.  9.  Aricept 10 mg p.o. q.d.  10. Enteric-coated aspirin 81 mg p.o. q.d.  11. Sodium bicarbonate tablets 650 mg each two tablets p.o. t.i.d.  12. Calciferol tablets 0.5 mg p.o. every other day.  13. Alprazolam 0.25 mg tablets p.o. q.8h. p.r.n. nerves.  14. Tylenol two tablets every four hours as needed.   The patient was instructed on proper care of the toe wound with dressing  changes at home and will follow up with me in the office on Tuesday,  September 19, 2005, at 1:45 p.m..  We will make sure he has a follow-up  appointment with [name] after that.   CONDITION ON DISCHARGE:  Improved.      Lonzo Cloud. [name], M.D. [facility]  Electronically Signed     SMN/MEDQ  D:  09/14/2005  T:  09/15/2005  Job:  [id number]   cc:   Nadara Mustard, MD  Fax: [phone]

## 2010-12-23 NOTE — Discharge Summary (Signed)
NAME:  [name], [name]                         ACCOUNT NO.:  [id number]   MEDICAL RECORD NO.:  [id number]                   PATIENT TYPE:  INP   LOCATION:  5150                                 FACILITY:  [facility]   PHYSICIAN:  Lonzo Cloud. [name], M.D. [facility]            DATE OF BIRTH:  01-06-1925   DATE OF ADMISSION:  04/14/2004  DATE OF DISCHARGE:  04/18/2004                                 DISCHARGE SUMMARY   FINAL DIAGNOSES:  1.  Admitted April 14, 2004 with acute renal failure superimposed on      chronic renal insufficiency.  The etiology of the acute renal failure      was unclear, but appeared to be multifactorial and largely related to      poor oral intake, increased output via his ileostomy, and resulting      dehydration.  Patient treated with intravenous fluids; nephrology      consultation, Dr. Fayrene Fearing L. Deterding, without additional workup      required.  ?Component from obstructive uropathy with patient's history      of prostate cancer in the past; no history of voiding difficulty prior      to admission; Foley catheter placed and negative postvoid residual check      this admission (urology consultation, Dr. Windy Fast L. Davis, without      additional problems identified).  2.  History of acute renal failure in 2001 in the context of cardiac      ischemia, status post catheterization, ACE inhibitor therapy, and      obstructive uropathy.  Creatinine was 10 at that time and he required      one-time dialysis with gradual improvement to a baseline of creatinine      1.7 to 1.8.  3.  History of prostate cancer in 2003, evaluation by Dr. Darvin Neighbours and x-      ray therapy, Dr. Wynn Banker.  Patient improved with PSA back to      normal (it was 7.4 at the time of diagnosis).  4.  Coronary artery disease with history of myocardial infarction and      previous 8-vessel coronary artery bypass grafting in 1998 by Dr. Delsa Grana. Wilson.  This hospitalization was complicated by  ischemic colitis,      requiring a total colectomy with ileostomy, sepsis and adult respiratory      distress syndrome.  He had subsequent wound dehiscence requiring plastic      surgery and a total of 3-1/2 months in the hospital.  5.  Ischemic cardiomyopathy, left bundle branch block and inducible      ventricular tachycardia.  He had an implanted cardioverter-defibrillator      placed in 2003.  Ejection fraction was 30% to 35% on his last 2-D      echocardiogram in 2003.  There is also a remote history of atrial  flutter in the past.  6.  History of hypertension.  7.  History of hypercholesterolemia (patient has refused statins in the      past).  8.  History of asthmatic bronchitis.  9.  Arteriosclerotic peripheral vascular disease with previous right basal      ganglionic lacunar infarct and small vessel disease and atrophy seen on      CT scan.  10. History of decreased memory and early Alzheimer's, on Aricept.  11. Hypothyroidism, on replacement therapy.  12. History of anxiety.  13. Remote history of alcoholism.  14. Prostate cancer and renal insufficiency, as noted above -- the patient      has a 2-cm right renal cyst on previous sonar and previous MR of the      abdomen showed bilateral renal cortical atrophy without mass or      hydronephrosis; and MRA showing a patent left renal artery and a      tortuous right renal artery with some proximal disease.   BRIEF HISTORY AND PHYSICAL:  The patient is a 77 year old white male known  to me with multiple medical problems as listed above.  He has a history of  acute renal failure in 2001 in the context of cardiac ischemia, cardiac  catheterization, ACE inhibitor therapy, and obstructive uropathy.  His  creatinine was 10 at that time and he required transient dialysis and  eventually improved to a creatinine of 1.7 to 1.8 at baseline.  It has been  at this level over the last year and was last checked in May of 2004 at 1.8.   He was diagnosed with prostate cancer in 2003 and evaluated by Dr. Darvin Neighbours, and had x-ray therapy by Dr. Kathrin Greathouse.  This was a Gleason 3 +  4 with a PSA at diagnosis of 7.4.  He improved with decrease in his PSA to  normal and has been in clinical remission since then with PSA in May of this  year of 0.4.   He presented to the office on April 14, 2004 complaining of a 3- to 4-  week history of increasing weakness, increasing shortness of breath and  progressive dyspnea.  He had nausea and vomited on 1 occasion the day prior  to admission.  The wife states he had been eating fair and drinking fluids  regularly, and apparently urinating without difficulty.  He noted some  abdominal discomfort which was vague and diffuse, and some swelling.  He  denied any change in his ileostomy output, without increased volume,  worsening diarrhea, or blood.  He was admitted for further evaluation and  treatment of his progressive symptomatology.   PAST MEDICAL HISTORY:  In addition to his history of chronic renal  insufficiency and episode of acute renal failure in 2001, he had a history  of coronary artery disease with a myocardial infarction and 8-vessel  coronary artery bypass grafting in 1998 by [name].  This was a long and  complex hospital course complicated by ischemic colitis which required a  total colectomy and ileostomy.  He was septic and had ARDS and developed a  subsequent wound dehiscence that required plastic closure, and he was in the  hospital for over 3-1/2 months at that time.  He has an ischemic  cardiomyopathy with left bundle branch block and inducible ventricular  tachycardia, had an implanted cardioverter-defibrillator placed in 2003 by  Dr Ladona Ridgel.  His ejection fraction was 30% to 35% when last checked in 2003.  He has a history of atrial flutter in the past as well.  He has a history of hypertension in the past.  He has a history of hypercholesterolemia and   refused to take statins in the past.  He has a history of asthmatic  bronchitis with no recent problems.  He has known arteriosclerotic  peripheral vascular disease with 1 right basal ganglionic lacunar infarct on  old scans.  This scan also showed small vessel disease and atrophy.  He has  had a mild dementia with decreased memory and has been started on Aricept.  As noted, he has a history of prostate cancer and renal insufficiency.  He  had radiation therapy by Dr. Dan Humphreys for prostate cancer.  He is known to  have a 2-cm right renal cyst on previous sonar.  Previous MRI of the abdomen  showed bilateral renal cortical atrophy, but no mass and no hydronephrosis.  MRA done at the same time showed a patent left renal artery, but a tortuous  right renal artery with some proximal disease noted.  He has a history of  hypothyroidism, controlled on Synthroid.  He has a history of anxiety and  alcoholism in the past.   PHYSICAL EXAMINATION:  GENERAL:  Physical examination at the time of  admission revealed a 77 year old white man, chronically ill-appearing, but  in no acute distress.  VITAL SIGNS:  Blood pressure 110/64, supine; 90/60, standing; pulse 56 per  minute and regular; respirations 20 per minute and not labored; O2 SAT 99%  on room air; temperature 98 degrees.  HEENT:  Exam revealed patient wears glasses; fundi were difficult to see.  Throat was negative.  He had a mild black eye on the day prior to admission.  NECK:  Exam showed no jugular distention, no carotid bruits, no thyromegaly  or lymphadenopathy.  RESPIRATORY:  Exam showed decrease in breath sounds bilaterally with a few  scattered rhonchi.  No wheezes, rales or signs of consolidation.  CARDIOVASCULAR:  Exam revealed a regular rhythm, grade 1/6 systolic ejection  murmur at the left sternal border, no rubs or gallops heard.  There was a  pacer defibrillator palpated.  GASTROINTESTINAL:  Exam showed a large skin graft in  the mid-abdominal wall  defect, ileostomy in the right side with yellow liquid stool.  Abdomen was  soft with intact bowel sounds and nontender, without evidence of  organomegaly or masses.  GU:  Exam was unremarkable.  Foley was placed on admission.  EXTREMITIES:  Extremities showed no cyanosis, clubbing or edema.  There were  some mild venous insufficiency changes.  NEUROLOGIC:  Exam was intact without focal abnormalities detected.  DERMATOLOGIC:  Exam was negative except for the skin graft on the abdomen.   LABORATORY DATA:  EKG showed sinus bradycardia with first-degree A-V block,  PVCs and nonspecific ST-T wave changes with left bundle branch block.   Followup 2-D echo was requested but not done, this hospitalization.   Chest x-ray showed underlying COPD and a prominent ascending thoracic aorta,  evidence of prior CABG and AICD noted.  Abdominal ultrasound showed a right lower pole renal cyst, no renal mass or  hydronephrosis.   Hemoglobin 15.5, hematocrit 43.7, white count 9400 with a normal  differential.  Pro time 13.0, INR 1.0, PTT 30 seconds.  Sodium 129,  potassium 4.0, chloride 107, CO2 21, BUN 127, creatinine 4.4 (they had been  144 and 5.0 on the day prior to admission), blood sugar 122, calcium 7.7,  total protein  7.2, albumin 3.5, AST 26, ALT 28, alkaline phosphatase 45,  total bilirubin 1.0, phosphorus 5.8.  CPK 63 with negative MB and negative  troponin.  TSH 3.9.  Urine creatinine 121, urine sodium less than 10.  Urinalysis clear.   HOSPITAL COURSE:  The patient was admitted and placed on IV fluids.  His  home medications were continued as usual including his amiodarone,  metoprolol, Levoxyl, Flomax, and Aricept.  His metoprolol was later held  with his low blood pressure and then restarted at 25 mg p.o. daily.  The  patient was seen in consultation by [name] of urology, who  recommended increasing his IV fluids and watching carefully.  His renal   function steadily improved with the IV fluids and his oral intake was good.  He did not require any nausea medications.  His creatinine gradually  improved from 5.0 down to 2.1 with a BUN of 38 prior to discharge.  The  patient had a Foley catheter placed on admission and he was seen in  consultation by Dr. Darvin Neighbours of the urology service.  Dr. Earlene Plater felt that  his voiding pattern was sufficient that obstructive uropathy was not part of  the problem and his Foley catheter was discontinued, and postvoid residual  checked and was negative.  The patient and his wife were instructed on  monitoring his blood pressure at home and monitoring his intake and output  to be sure that he does not get dehydrated in the future.   MEDICATIONS AT DISCHARGE:  1.  Amiodarone 200 mg 1/2 tablet p.o. daily.  2.  Metoprolol 50 mg 1/2 tablet p.o. daily.  3.  Levoxyl 75 mcg p.o. daily.  4.  Flomax 0.4 mg p.o. daily.  5.  Aricept 10 mg p.o. daily.  6.  Alprazolam 0.5 mg p.o. t.i.d. as needed for nerves.  7.  Amitriptyline 25 mg p.o. nightly as needed.  8.  Aspirin 81 mg p.o. daily.  9.  Tylenol 2 tablets p.o. q.4 h. p.r.n. pain.   DIET AND FOLLOWUP:  The patient was instructed on a no-added-salt, fat-  modified diet and will follow up with me in the office on Wednesday,  April 27, 2004, at noon for repeat labs.   CONDITION AT DISCHARGE:  Improved.                                                Lonzo Cloud. [name], M.D. [facility]    SMN/MEDQ  D:  04/18/2004  T:  04/18/2004  Job:  [id number]   cc:   Fayrene Fearing L. Deterding, M.D.  [address]  [place]  [place] [address]  Fax: [phone]

## 2010-12-23 NOTE — Assessment & Plan Note (Signed)
[facility]                            CARDIOLOGY OFFICE NOTE   NAME:[name], [name]                      MRN:          [id number]  DATE:09/13/2006                            DOB:          04/27/1925    After reviewing the EKG with Dr. Juanda Chance, I have decided to not increase  his Coreg at this time. We will increase the hydralazine to 20 every 8  hours. I will see him back in 2 months or p.r.n. Have his blood pressure  checked in the interim.     Cecil Cranker, MD, [facility]     EJL/MedQ  DD: 09/13/2006  DT: 09/13/2006  Job #: [id number]

## 2010-12-23 NOTE — Discharge Summary (Signed)
NAMEMARCELLIS, FRAMPTON NO.:  [id number]   MEDICAL RECORD NO.:  [id number]          PATIENT TYPE:  INP   LOCATION:  4732                         FACILITY:  [facility]   PHYSICIAN:  Cecil Cranker, M.D.DATE OF BIRTH:  Dec 09, 1924   DATE OF ADMISSION:  10/28/2005  DATE OF DISCHARGE:  11/05/2005                                 DISCHARGE SUMMARY   NO DICTATION.     ______________________________  April Humphrey, NP    ______________________________  [name], M.D.    AH/MEDQ  D:  12/21/2005  T:  12/22/2005  Job:  [id number]

## 2010-12-23 NOTE — Consult Note (Signed)
NAME:  [name], [name]                         ACCOUNT NO.:  [id number]   MEDICAL RECORD NO.:  [id number]                   PATIENT TYPE:  INP   LOCATION:  5150                                 FACILITY:  [facility]   PHYSICIAN:  Zanden L. Deterding, M.D.            DATE OF BIRTH:  08/05/1925   DATE OF CONSULTATION:  04/14/2004  DATE OF DISCHARGE:                                   CONSULTATION   REASON FOR CONSULTATION:  Acute renal failure.   HISTORY OF PRESENT ILLNESS:  This is a 77 year old gentleman whom we have  met in the past with a history of acute renal failure requiring dialysis in  2001.  That was related to hypotension secondary to ACE inhibitor.  Renal  artery disease was not found at that time.  He required transient  hemodialysis.  Creatinine decreased to 1.7.  He had a history of transient  acute renal failure prior to that in 1998 when he had his coronary bypass  which was extremely complicated by ischemic bowel requiring a colectomy,  ileostomy.  He had an open wound requiring a skin graft.  He had ARDS at  that time.   In 2003, he was diagnosed with intermittent ventricular tachycardia.  He had  a bi-chamber ICD placed.  He has a history of prostate cancer, also  diagnosed in 2003, treated with radiation therapy.  Over the last three to  four weeks, he has had decreased energy and decreased appetite, nausea,  vomiting, dyspnea on exertion, and just does not feel good.  His wife has  noticed it, and he has noticed it.  He notes no itching or muscle cramping,  no edema.  No voiding symptoms, no hematuria.  He has no history of stones.   ALLERGIES:  He has a history of allergies to dye and some antibiotics, but  he has not been on antibiotics recently.   He has had no rash, non nonsteroidal ingestion, no cough or arthralgias.  He  usually has nocturia x1.  That has not changed.  He denies PND.   He denies any new medications.   CURRENT MEDICATIONS:  1.  Toprol 25 mg  b.i.d.  2.  Aricept once a day.  3.  Amiodarone 100 mg a day.  4.  Levoxyl 75 mcg a day.  5.  Enteric-coated aspirin, 81 mg a day.  6.  He has had no recent change in medications.   REVIEW OF SYSTEMS:  HEENT:  He is hard of hearing.  It has been a chronic  problem.  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  This  was for over 29 years.  CARDIOVASCULAR:  As listed above.  No edema.  He sleeps on two pillows.  Nocturia x1.  He denies PND.  He had dyspnea on walking more than about one  block.  GI:  Nausea, upset stomach.  In  the last couple of weeks, he has vomited  several times. He has an ileostomy, and he has not noticed any change in his  volume there.  SKIN:  No rash or itching.  MUSCULOSKELETAL:  He had some aches in his left foot that occur at night.  NEUROLOGIC:  His wife had noticed nothing new, and he had no specific  complaints.  PULMONARY:  No wheezing or coughing.   PAST MEDICAL HISTORY:  The above illnesses.   ALLERGIES:  As listed above.   MEDICATIONS:  As listed above.   FAMILY HISTORY:  His mother died in her 17's of coronary disease.  Father  died in his 41's of coronary disease.  He had a brother who died in his 42's  of coronary disease.  One died of cancer.  Another one had a brain tumor of  some kind.  He does not know their ages.  His children are all healthy.   SOCIAL HISTORY:  He quit smoking a number of years ago and is a nondrinker.   PHYSICAL EXAMINATION:  GENERAL:  He is an elderly gentleman.  He is  pleasant.  He is somewhat slow mentally.  He is in no acute distress.  VITAL SIGNS:  Blood pressure supine is 80/58, going to 78/40 standing.  HEENT:  Fundi are benign.  CARDIOVASCULAR:  Regular rhythm, some prematures.  Rate is 56-60.  There is  a grade 2/6 holosystolic murmur heard best at the apex.  PMI is heard at the  5th intercostal space.  EXTREMITIES:  No edema.  No bruits.  Dorsalis pedis pulses are decreased to  1+/4+, the  rest are 2+/4+.  LUNGS:  Revealed decreased breath sounds, but no rales, rhonchi or wheezes.  Slight decreased expansion. Normal resonance to percussion.  ABDOMEN:  Positive bowel sounds, soft.  Ileostomy right lower quadrant.  A  large ventral defect with a skin graft over it in the midline.  It extends  from nearing the sternum to the pubis.  There is a skin graft over it, and  it is bluish discoloration.  SKIN:  Overall is pale.  He has a lot of seborrheic keratosis and actinic  changes.  MUSCULOSKELETAL:  Shows some hypertrophic changes in PIP's and BIP's in the  hands.  He has some atrophic changes in his feet.   LABORATORY DATA:  Sodium 134, potassium 4.6, chloride 104, bicarbonate 15,  creatinine 5, BUN 133, glucose 130, albumin 3.8, hemoglobin 16.  White count  10.9, platelets 198,000.   ASSESSMENT:  Acute renal failure on top of chronic renal failure.  His  creatinine is 1.7, with a baseline of 605 with a GFR of 40 cc a minute.  Creatinine of 5 now.  It is 14 cc a minute.  He has moderate uremia and  acidosis.   DIFFERENTIAL DIAGNOSES:  1.  Obstruction versus hemodynamic causes, less likely acute interstitial      vascular or acute glomerular nephritis.  He clearly has volume decrease      at this time.  2.  Coronary artery disease, stable.  3.  Arrhythmia on amiodarone.  We will check a level.  4.  Congestive heart failure.  5.  Prostate cancer, need to rule out obstruction above or below the      bladder.  6.  Increased __________ .  7.  Dementia.   PLAN:  1.  IV fluids.  2.  Foley catheter.  3.  Bicarbonate.  4.  Ultrasound.  5.  Urinalysis.  6.  Urine sodium and creatinine.                                               Teryl L. Deterding, M.D.    JLD/MEDQ  D:  04/14/2004  T:  04/15/2004  Job:  [id number]

## 2010-12-23 NOTE — Consult Note (Signed)
NAMEGUINN, DELAROSA NO.:  [id number]   MEDICAL RECORD NO.:  [id number]          PATIENT TYPE:  INP   LOCATION:  4732                         FACILITY:  [facility]   PHYSICIAN:  Jesse Sans. Wall, M.D.   DATE OF BIRTH:  03-Mar-1925   DATE OF CONSULTATION:  DATE OF DISCHARGE:                                   CONSULTATION   REFERRING PHYSICIAN:  Dr. Lonzo Cloud. Nadel.   REASON FOR CONSULTATION:  We are asked by Dr. Alroy Dust to evaluate [name] who presents with acute-on-chronic systolic congestive heart  failure.   HISTORY OF PRESENT ILLNESS:  This is a 77 year old gentleman well-known to  our cardiovascular service.  He has coronary artery disease, status post  coronary artery bypass grafting x4 in December 1998.  At that time he had a  vein graft to an OM-2, a diagonal 1 and a vein graft to a right coronary  artery with a LIMA to a diagonal 2.   He was last catheterized October 26, 1999 with grafts being patent.  His EF  was 30% by last echo, December 2006.  He had diffuse hypokinesis at that  time with multiple segmental wall motion abnormalities including septal,  posterior and inferior akinesia.  He had mild-to-moderate mitral  regurgitation.  He had moderate pulmonary hypertension.   He presented with a week-long history of progressive shortness of breath and  orthopnea.  He was admitted on Saturday.  At the time, his BNP was over  1000.  He has been diuresed and feels remarkably better.  His weight has  actually dropped from 209.4 to 201.7.  I's and O's are not recorded.   He has also been noted to be in an irregular rapid rate on his telemetry;  this looks most like atrial fibrillation.  He has a history of atrial  flutter.   ALLERGIES:  He is intolerant to multiple medicines including PENICILLIN,  CLINDAMYCIN, CEPHALOSPORINS, AUGMENTIN and CEFAZOLIN.   MEDICATIONS ON ADMISSION:  1.  Xanax 0.5 mg nightly.  2.  Elavil 10 mg nightly.  3.   Calciferol one q.48 h.  4.  Flomax 0.4 mg nightly.  5.  Enoxaparin 30 mg nightly.  6.  Protonix 40 mg daily.  7.  Potassium 20 mEq a day.  8.  Synthroid 75 mcg a day.  9.  Aspirin 81 mg a day.  10. Lasix currently at 60 mg IV twice daily.  11. Sodium bicarbonate 1300 three times daily.  12. Aricept 10 mg nightly.  13. Amiodarone 100 mg a day.  14. Mucinex 600 mg twice daily.   PAST MEDICAL HISTORY:  1.  He has had a history of prostate CA.  2.  He has had ischemic colitis.  3.  Secondary hyperparathyroidism.  4.  Asthmatic bronchitis.  5.  Remote tobacco.  6.  He has got peripheral vascular disease.  7.  Cerebrovascular disease.  8.  He is hypothyroid, on replacement therapy.  9.  He has had a history of anxiety and some Alzheimer's problems.   SOCIAL HISTORY:  He lives  in [place] with his wife.  He has 1 grown  daughter.  He quit smoking in 1953.  He is a retired Arboriculturist.   FAMILY HISTORY:  Both parents died of heart attacks at age 10.  He has 2  sisters that are deceased with coronary disease.   REVIEW OF SYSTEMS:  Review of systems is negative other than the HPI.   PHYSICAL EXAMINATION:  GENERAL APPEARANCE:  He is currently comfortable.  His skin is slightly ruddy, but dry.  He is in no acute distress, as  mentioned above.  VITAL SIGNS:  Blood pressure 116/70.  His pulse is 100-110 and irregular.  His temperature is 97.5.  Respiratory rate is 20.  O2 SAT is 92% on 2 L.  HEENT:  Normocephalic, atraumatic.  Sclerae are clear.  PERRLA.  Extraocular  movements intact.  NECK:  Supple.  Carotids are full.  There is no obvious JVD.  Thyroid is not  enlarged.  LUNGS:  Clear to auscultation.  CARDIOVASCULAR:  Exam reveals an irregular rate and rhythm with a soft  systolic murmur at the apex.  ABDOMINAL EXAM:  Soft with good bowel sounds.  He is slightly distended.  There is no tenderness.  EXTREMITIES:  Extremities reveal 1+ pitting edema.  Pulses are reduced,  1+/4+  posterior tibials with minimally felt dorsalis pedis.  NEUROLOGIC:  Exam is grossly intact.   LABORATORY AND ACCESSORY CLINICAL DATA:  Chest x-ray showed new left-greater-  than-right pleural effusions with a retrocardiac opacity with right basilar  atelectasis.   EKG shows a left bundle with sinus rhythm.  We repeated it and it now shows  atrial fibrillation at a rate of 100 with his left bundle.   Laboratory data are remarkable for potassium of 2.8, BUN of 28, creatinine  of 1.9.  His BNP is down to 827 from 1022 on admission.  Cardiac enzymes are  negative.  Hemoglobin is 13.9.   ASSESSMENT:  1.  Ischemic cardiomyopathy, status post coronary bypass grafting.  He has      an ejection fraction of 30% with multiple segmental wall motion      abnormalities and global hypokinesia.  He has moderate mitral      regurgitation and pulmonary hypertension.  2.  History of atrial flutter, now with what appears to be atrial      fibrillation with a slightly increased heart rate.  This probably      contributed to his presentation with acute-on-chronic systolic heart      failure.  3.  Acute-on-chronic systolic heart failure.  4.  History of peripheral vascular disease.  5.  History of cerebrovascular disease.  6.  Pulmonary hypertension.  7.  Moderate mitral regurgitation.  8.  Left bundle branch block pattern.  9.  Hypokalemia.   RECOMMENDATIONS:  1.  Continue diuresis.  2.  Replete potassium.  3.  Increase amiodarone from 100 mg to 200 mg a day.  4.  Anticoagulation with heparin and Coumadin.  5.  Two-dimensional echocardiogram.  6.  Beta blockade for rate control.  Add digoxin if his rate control is not      adequate.  7.  TSH.   Thank you for the consultation.      Thomas C. Wall, M.D.  Electronically Signed     TCW/MEDQ  D:  10/30/2005  T:  11/01/2005  Job:  [id number]   cc:   Cecil Cranker, M.D.  [address]  [address]  [address]  [place] [address]   Lonzo Cloud. [name],  M.D. [facility]  [address]  [place]  [place] [address]

## 2010-12-23 NOTE — H&P (Signed)
NAMECARMIN, DIBARTOLO NO.:  [id number]   MEDICAL RECORD NO.:  [id number]          PATIENT TYPE:  INP   LOCATION:  4729                         FACILITY:  [facility]   PHYSICIAN:  Shan Levans, M.D. LHCDATE OF BIRTH:  1924/08/17   DATE OF ADMISSION:  10/28/2005  DATE OF DISCHARGE:                                HISTORY & PHYSICAL   CHIEF COMPLAINT:  Pleural effusion, dyspnea, and anasarca.   HISTORY OF PRESENT ILLNESS:  This is a 77 year old white male who has had  progressive dyspnea, scrotal edema, and lower extremity edema.  He has been  seen by our nurse practitioner, Rubye Oaks, NP [facility], this past week as  well as cardiology with adjustments in Lasix dose, but not much improvement.  His level of dyspnea has gotten to the point where he came to the hospital  today for further inpatient care.  The patient has orthopnea, but no cough.  No chest pain is noted.  He has a history of ischemic cardiomyopathy,  previous bypass surgery, coronary artery disease, renal insufficiency, acute  on chronic.  He is admitted now for further inpatient care of decompensated  congestive heart failure.   PAST MEDICAL HISTORY:  History of hypertension, history of ischemic  cardiomyopathy with left bundle branch block, history of coronary artery  disease with bypass surgery in 1998 with previous MI.  History of  hypertension and increased cholesterol, history of asthmatic bronchitis.  History of atherosclerotic peripheral vascular disease.  History of memory  loss and mild dementia.  History of prostatic carcinoma and renal  insufficiency.  History of hypothyroidism, anxiety, and ethanol use.   SOCIAL HISTORY:  The patient is married to a wife of 20 years, a second  marriage.  He is an ex-smoker, quit in the 1950's.  Previous ethanol use,  quit in the mid-90's.  Worked in [employer], now retired.   REVIEW OF SYSTEMS:  Noncontributory.   FAMILY HISTORY:  Positive for  coronary artery disease and diabetes.   MEDICATIONS:  Prior to admission.  1.  Amiodarone 100 mg daily.  2.  Alprazolam 0.5 mg q.h.s.  3.  Amitriptyline 10 mg q.h.s.  4.  Aricept 10 mg daily.  5.  Aspirin 81 mg daily.  6.  Calcitriol one every other day.  7.  Flomax 0.4 mg q.h.s.  8.  Lasix 40 mg daily.  9.  Levoxyl 75 mcg daily.   REVIEW OF SYSTEMS:  Otherwise noncontributory.   PHYSICAL EXAMINATION:  VITAL SIGNS:  Temperature 97, blood pressure 150/86,  pulse 74, respirations 22, saturation 96% on room air.  CHEST:  Bilateral rales, decreased breath sounds and dullness to percussion  one-half the way up on the left.  Clear on the right.  HEART:  Irregular regular rhythm without S3.  Normal S1 and S2.  No murmur.  ABDOMEN:  Soft and nontender.  EXTREMITIES:  No edema or clubbing.  SKIN:  Clear.  NEUROLOGY:  Intact.  HEENT:  No jugular venous distention, lymphadenopathy.  Oropharynx clear.  NECK:  Supple.   LABORATORY DATA:  Chest x-ray showed mild  edema and cardiomegaly, left  greater than right pleural effusion.   White count 5.4, hemoglobin 13.9, platelet count 175, BNP 10,022.  CK-MB  4.6, troponin 0.03, sodium 132, potassium 4.5, chloride 96, CO2 31, BUN 35,  creatinine 2.1, calcium 8.3.  Liver function tests unremarkable.   EKG; sinus rhythm, marked sinus arrhythmia with first degree AV block, left  bundle branch block, abnormal EKG.   IMPRESSION:  1.  Ischemic cardiomyopathy with decompensation, anasarca, and mild      pulmonary edema.  2.  Acute on chronic renal insufficiency.  3.  History of bypass surgery with coronary artery disease.  4.  History of colostomy with colectomy after bypass surgery.  5.  History of hypertension.  6.  Asthmatic bronchitis.  7.  History of prostate cancer with renal insufficiency.  8.  History of atherosclerotic peripheral vascular disease.  9.  Hyperlipidemia.  10. Anxiety.   PLAN:  Lasix.  Check echo.  Administer oxygen.  Low  salt diet.      Shan Levans, M.D. [facility]  Electronically Signed     PW/MEDQ  D:  10/28/2005  T:  10/30/2005  Job:  [id number]   cc:   Lonzo Cloud. [name], M.D. [facility]  [address]  [place]  [place] [address]

## 2010-12-23 NOTE — Discharge Summary (Signed)
   NAME:  [name], [name]                         ACCOUNT NO.:  [id number]   MEDICAL RECORD NO.:  [id number]                   PATIENT TYPE:  OIB   LOCATION:  4740                                 FACILITY:  [facility]   PHYSICIAN:  Doylene Canning. Ladona Ridgel, M.D. [facility]           DATE OF BIRTH:  [birth year]   DATE OF ADMISSION:  07/22/2002  DATE OF DISCHARGE:  07/23/2002                                 DISCHARGE SUMMARY   HISTORY OF PRESENT ILLNESS:  This is a 77 year old gentleman with a past  medical history of ischemic cardiomyopathy, status post CABG, and  nonsustained VT, left bundle branch block, who was admitted for EP study  plus-or-minus ICD.   PAST MEDICAL HISTORY:  Past medical history is positive for ischemic  cardiomyopathy, EF of 30-35%, nonsustained VT, coronary artery disease,  acute renal failure, left bundle branch block.   PAST SURGICAL HISTORY:  Bypass surgery and bowel resection.   ALLERGIES:  His allergies are to IV DYE.   HOSPITAL COURSE:  The patient was admitted and underwent an EP study which  was inducible for monomorphic VT.  The patient had poor A-V nodal conduction  with an H-V of 85 msec.  The patient underwent placement of a dual-chamber  ICD.  He tolerated the procedure well, had no immediate complications and  was discharged to home in sinus rhythm.   DISCHARGE MEDICATIONS:  He was discharged on the following medications, as  prior to admission:  1. Lopressor 25 mg b.i.d.  2. Amiodarone 100 mg a day.  3. Coated aspirin 325 mg daily.  4. Synthroid 75 daily.  5. Elavil 25 mg nightly.  6. Flomax 0.4 mg nightly.  7. Tylenol one to two tablets every four to six hours as needed for pain.   ACTIVITY:  As per discharge instruction sheet.   WOUND CARE:  As per discharge instruction sheet.   DIET:  Low-fat, low-salt, low-cholesterol diet.    FOLLOWUP:  He was scheduled to follow up in the pacemaker clinic at [facility],  August 11, 2001, at 9:45 a.m. and to return to  see Dr. Doylene Canning. Ladona Ridgel  within three months and the office will call to schedule that appointment.     Chinita Pester, C.R.N.P. [facility]                 Doylene Canning. Ladona Ridgel, M.D. [facility]    DS/MEDQ  D:  07/23/2002  T:  07/24/2002  Job:  [id number]   cc:   Lonzo Cloud. [name], M.D. [facility]   Doylene Canning. Ladona Ridgel, M.D. [facility]  [address]  [place]  [place] [address]  Fax: 1   [facility]

## 2010-12-23 NOTE — Consult Note (Signed)
[name], [name] NO.:  [id number]   MEDICAL RECORD NO.:  [id number]          PATIENT TYPE:  INP   LOCATION:  4732                         FACILITY:  [facility]   PHYSICIAN:  Aram Beecham B. Eliott Nine, M.D.DATE OF BIRTH:  [birth year]   DATE OF CONSULTATION:  10/31/2005  DATE OF DISCHARGE:                                   CONSULTATION   [name] is a 77 year old white male patient of Dr. Jodelle Green who has an  ischemic cardiomyopathy with biventricular pacemaker, paroxysmal atrial  fibrillation, hypertension, history of CABG and other complicated medical  issues.  He was admitted October 28, 2005 with progressive dyspnea, weight  gain and pleural effusions that did not respond to initiation and escalation  of outpatient doses of oral Lasix (20-80 mg a day).  He has been undergoing  IV diuretic therapy with Lasix at a current dose of 60 mg IV every 12 hours  with an approximate 12 pounds weight loss since admission and improvement in  his dyspnea and lower extremity edema.  His serum creatinine which was 2.1  on admission and 1.9 the day after was up to 2.4 today.  He generally runs  between 1.7-2.2.  A 2-D echocardiogram done October 30, 2005 showed a marked  reduction in ejection fraction since his last echocardiogram from December  from 30% to around 10-15% with severe left ventricular dysfunction but was  described as normal right ventricular function but moderate TR and mild MR  (of note 2-D echocardiogram in December showed evidence for pulmonary  hypertension).   The patient is followed in our office by Dr. Rosanne Ashing Deterding and has a known  history of stage IV CKD secondary to hypertension.  He has had three  episodes of acute renal failure over the years in 1988, 2001-requiring  transient dialysis, and 2005.  His baseline GFR is around 20 with a serum  creatinine of 1.9-2.2.  He has an underlying renal tubular acidosis.  Other  complications include mild  hyperparathyroidism on calcitriol therapy.  He  has a history of prostate cancer and has had some elevated postvoid  residuals in the past.   When he last saw [name] in September, he was not receiving any  furosemide for control of volume and in fact, had not been getting any for  the past couple of years and was noted to have a weight of around 197 with  trace to 1+ lower extremity edema.  He did not in fact, require diuretics  until early to mid-March of this year.   PAST MEDICAL HISTORY:  1.  Ischemic cardiomyopathy with a reduction in EF from 30% to 15% December      of 2006.  2.  History of CABG.  3.  PAF.  4.  Hypertension.  5.  Left bundle branch block.  6.  Hyperlipidemia.  7.  History of prostate cancer with elevated postvoid residuals in the past.  8.  Mild dementia.  9.  Status post February 2007 admission for left great toe cellulitis.  10. Allergic reaction to penicillin and possibly cephalosporins February  2007 with a blistering rash.  11. Peripheral vascular disease.  12. History of biventricular pacer.  13. Hypothyroidism on replacement.  14. History of tobacco and alcohol use in the past.  15. 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  16. COPD.   CURRENT MEDICINES:  1.  Alprazolam 0.5 mg at bedtime.  2.  Amiodarone 200 mg a day.  3.  Amitriptyline 25 mg a day.  4.  Aspirin 81 mg a day.  5.  Aricept 10 mg a day.  6.  Ergocalciferol 50,000 units every other day.  7.  Furosemide 60 mg IV twice a day.  8.  IV heparin.  9.  Synthroid 75 mcg a day.  10. Xopenex.  11. Lopressor 12.5 mg every 6 hours.  12. Protonix 40 mg a day.  13. Sodium bicarbonate 2 tablets t.i.d.  14. K-Dur 20 mEq t.i.d.  15. Flomax 0.4 mg at bedtime.  16. Coumadin per pharmacy protocol and various p.r.n. medications.   FAMILY HISTORY:  Positive for diabetes and coronary disease.   SOCIAL HISTORY:  The patient is married to his  second wife.  He has a remote  history of tobacco and alcohol.  He states that his wife looks after me and  my medicines.Marland Kitchen   REVIEW OF SYSTEMS:  Prior to admission was positive for progressive dyspnea  and swelling for 2-3 weeks both of which have improved.  He has had no chest  pain, nausea or vomiting.  Appetite has been okay.  His skin rash has  resolved since February.  He has residual coolness in his left great toe as  well as some nail changes.  He has had no claudication.  Otherwise, his  review of systems is negative.   PHYSICAL EXAM:  He is a very pleasant elderly white male.  He is not  receiving oxygen and is not dyspneic.  Blood pressure 115/72, heart rate 56,  temperature 96.6 degrees, O2 saturation 96% on room air.  Weight is 197.7  down from a maximum this admission of 209.4.  Urine output is good with a  negative balance of 2.225 liters in the last 24 hours.  He has JVD 5-6 cm at  a 30 degree angle.  Breath sounds are diminished but lungs are clear without  rales or wheezes.  Cardiac exam irregular rhythm, 1/6 murmur at left sternal  border.  No pericardial rub.  Pacer defibrillator left anterior chest  noninflamed.  His abdomen reveals an ostomy bag in the right abdomen  draining brown stool and a very large skin defect with what appears to be  skin flap and abdominal wall muscle defect in the left upper abdomen.  Extremities show 1+ dependent and calf edema.  His feet are cool.  The left  great toe is cool and ruborous and nail was dystrophic.  There were no  palpable pulses in his feet.   Laboratory from today show sodium 137, potassium 4.2, chloride 97, CO2 28,  BUN 32, creatinine 2.4, calcium 8.2, albumin 3.3.  Hemoglobin 13.5.  BNP  from October 30, 2005 was 827.  His urinalysis is negative for protein.  It  did show moderate leukocyte esterase and only 3-6 white cells and 0-2 red cells.  Initial CK was 91 with an MB of 4.6.  Admission chest x-ray showed  left  greater than right pleural effusions, right basilar atelectasis and a  left-sided defibrillator.   IMPRESSION:  A 77 year old white male with ischemic cardiomyopathy and  progressive dyspnea and weight gain with a reduction in EF from 30% to 15%  and new requirement for diuretics now has  1.  Acute renal failure superimposed on chronic kidney disease:  This whole      clinical  presentation may simply reflect the reduction in ejection      fraction and associated volume retention.  He is now receiving IV      diuretics with an excellent response but his creatinine has risen to      2.4.  I suspect that his renal function will now be very sensitive to      changes in volume (diuresis) as well as renal perfusion (diminished      secondary to low cardiac output).  From a pulmonary standpoint, he is no      longer symptomatic and his peripheral edema has improved dramatically as      his weight has dropped.  Would decrease his IV Lasix and then consider      changing to p.o. now that his weight is approaching baseline and his      failure is better.  Will check a follow-up chest x-ray on November 01, 2005      to reevaluate his pleural effusions.  2.  Secondary hyperparathyroidism:  The patient was on outpatient calcitriol      and somehow has been switched to ergocalciferol here in the hospital.      We will change him back to 0.5 mg every other day of calcitriol.  3.  Ischemic cardiomyopathy with reduced ejection fraction, per cardiology.      With regard to their question of about use of ARB therapy with acute      renal failure, I think this is out of the question and I would be      extremely hesitant to use it in the setting of his low EF and poor renal      perfusion issues as we would be likely to see a bump in creatinine and      possibly even some hyperkalemia.  4.  Atrial fibrillation now being systemically anticoagulated.  5.  Hypertension at goal.  6.  History of mild anemia-not  presently an issue with a normal hemoglobin.  7.  History of prostate cancer with elevated postvoid residuals:  I doubt      this is playing much of a role now.  He states he is voiding normally.  8.  Hypothyroidism on replacement.  9.  History of ostomy for bowel ischemia.  10. Chronic obstructive pulmonary disease.  11. Mild dementia.  12. Other issues per primary service.   Thanks for asking [place] to see this very nice gentleman.  We will follow  closely with you.  With regard to the issue of dialysis, he would not be a  very favorable long-term dialysis candidate, but states that he would  probably pursue this if we recommended it if it were absolutely necessary.  So we will certainly monitor for indication.           ______________________________  Duke Salvia Eliott Nine, M.D.     CBD/MEDQ  D:  10/31/2005  T:  11/01/2005  Job:  [id number]

## 2010-12-23 NOTE — Assessment & Plan Note (Signed)
[facility]                            CARDIOLOGY OFFICE NOTE   NAME:[name], [name]                      MRN:          [id number]  DATE:11/27/2006                            DOB:          Jul 29, 1925    SUBJECTIVE:  [name] is a very pleasant 77 year old white married  male with a long history of coronary artery disease, ischemic  cardiomyopathy and implantable cardioverter/defibrillator.  He had an  episode of ventricular fibrillation.  He also has a history of atrial  flutter, for which he takes amiodarone.   He had a coronary artery bypass graft surgery in 1989, with multiple  complications as previously outlined.  He had an episode of congestive  heart failure in March 2007.  He has mild renal insufficiency.   The patient is doing quite well at this time, with no cardiac symptoms  except for some mild dizziness.   CURRENT MEDICATIONS:  1. Flomax 0.4 mg.  2. Coumadin.  3. Imdur 10 mg q.8h.  4. Furosemide 40 mg.  5. Calcitriol.  6. Aricept.  7. Levoxyl 100 mg.  8. Amitriptyline.  9. Sodium bicarbonate.  10.Amiodarone 100 mg daily.  11.Hydralazine 20 mg q.8h.  12.Coreg 6.2 mg b.i.d.   PHYSICAL EXAMINATION:  VITAL SIGNS:  Blood pressure 151/72 lying, 131/71  standing.  We did not do formal orthostatics today.  GENERAL:  Appearance is stable.  NECK:  JVP is not elevated.  Carotid pulses are palpably equal without  bruits.  LUNGS:  Clear.  HEART:  No murmurs, gallops or rubs.  ABDOMEN:  Unremarkable.  EXTREMITIES:  Unremarkable for edema.   PLAN:  We plan to continue on the same therapy.  He is to see Dr. Lonzo Cloud. [name] later today.  I will check a BMP and refer him to [name].  [name] for long-term followup concerning his cardiac disease and  cardioverter/defibrillator.     [name] Cranker, MD, [facility]  Electronically Signed    EJL/MedQ  DD: 11/27/2006  DT: 11/27/2006  Job #: [id number]   cc:   [name] Cloud. [name], MD  Genene Churn. Cyndie Chime, M.D.

## 2010-12-23 NOTE — Consult Note (Signed)
NAME:  [name], [name]                         ACCOUNT NO.:  [id number]   MEDICAL RECORD NO.:  [id number]                   PATIENT TYPE:  INP   LOCATION:  5150                                 FACILITY:  [facility]   PHYSICIAN:  Ronald L. Ovidio Hanger, M.D.           DATE OF BIRTH:  04/03/1925   DATE OF CONSULTATION:  DATE OF DISCHARGE:                                   CONSULTATION   CONSULTING PHYSICIAN:  Ronald L. Earlene Plater, M.D.   REASON FOR CONSULTATION:  Renal failure, history of prostate cancer.   HISTORY OF PRESENT ILLNESS:  [name] is a very nice 77 year old white  male who is well known to our practice.  He essentially presented last 3-4  weeks of decreased energy, decreased appetite, nausea, vomiting, dyspnea on  exertion, and general lethargy.  He was admitted to the hospital yesterday  with a creatinine of 5.0 and is for evaluation of that.  He has suffered  prostate cancer in the past and is status post radiation therapy for that in  2003.  He has subsequently been in remission.  He has chronic renal  insufficiency with baseline creatinine around approximately 1.7 and now  presents with acute renal failure.  He has had slight decrease force of  urinary stream but this has been mild, chronic, and long term.  Of note, he  has suffered acute renal failure previously and required temporary dialysis.  He has also had ischemic bowel in the past and has an ileostomy resulting  from that.   ALLERGIES:  1.  DYE.  2.  SOME ANTIBIOTICS.   CURRENT MEDICATIONS:  1.  Toprol 25 mg p.o. b.i.d.  2.  Aricept one per day.  3.  Amiodarone 100 mg p.o. every day.  4.  Levoxyl 75 mcg every day.  5.  Enteric aspirin 81 mg a day.   FAMILY HISTORY:  Mother died in her 94s of coronary disease.  Father died in  his 64s from coronary disease.  Brother died in his 38s from coronary  disease.  One died of cancer a brain tumor.  His children are all healthy.   SOCIAL HISTORY:  He quit smoking a  number of years ago.  He is not a  drinker.   REVIEW OF SYSTEMS:  He has slight dyspnea on exertion.  He is hard of  hearing.  He has no real shortness of breath.  No chest pain.  He has some  nausea and stomach upset but that is resolved.  He has an ileostomy that is  functioning well.  He does urinate with slight decreased force of stream  that is very mild and long term.   PHYSICAL EXAMINATION:  VITAL SIGNS:  Blood pressure is 115/79, pulse 55,  respirations 22, temperature 96.9 degrees Fahrenheit.  GENERAL:  He is elderly in no acute distress, oriented x 3.  HEENT:  Normal.  He is communicative but speaks  quite slowly.  Ear, nose,  and throat are clear.  NECK:  Without masses, thyromegaly.  CHEST:  Normal sinus rhythm.  There is a 2/6 holosystolic murmur heard best  at the apex.  LUNGS:  Some decreased breath sounds but no rales or rhonchi.  ABDOMEN:  Soft, nontender without masses or organomegaly.  Ileostomy in the  right lower quadrant appears to be functioning well.  EXTREMITIES:  Essentially normal.  SKIN:  Intact.  GENITOURINARY:  Penis, meatus, scrotum, testicle, __________  and perineum  are normal.  Prostate, on a recent examination, was firm, smooth, and flat.   PERTINENT LABORATORY EVALUATION:  Today reveals a stable CBC.  Creatinine is  decreased to 3.5.   Bilateral renal ultrasound revealed no hydronephrosis and no significant  renal masses.   IMPRESSION:  1.  Prostate cancer, status post radiation therapy in 2003, currently in      remission.  2.  Chronic renal insufficiency secondary to acute renal failure, probably      prerenal.  I really think no urologic cause of the renal failure is      present and it appears to be improving.   PLAN:  Maintain Foley catheter till __________  appropriate, may remove at  that time.  Please call as needed.  He will keep his usual appointments.  Thanks.                                               Ronald L. Ovidio Hanger,  M.D.    RLD/MEDQ  D:  04/15/2004  T:  04/16/2004  Job:  [id number]   cc:   Lonzo Cloud. [name], M.D. [facility]   Fayrene Fearing L. Deterding, M.D.  [address]  [place]  [place] [address]  Fax: [phone]

## 2010-12-23 NOTE — Assessment & Plan Note (Signed)
[facility]                            CARDIOLOGY OFFICE NOTE   NAME:DANIELSAdorian, Gwynne                      MRN:          [id number]  DATE:09/13/2006                            DOB:          30-Mar-1925    [name] is a very pleasant 77 year old white married male with a  long history of coronary artery disease, ischemic cardiomyopathy, and  implantable cardioverter defibrillator after an episode of ventricular  fibrillation.   The patient had CABG in 1999 with multiple complications as previously  outlined.  He has had significant IV conduction delay.  The patient is  much improved at this time and feeling quite well.  His amiodarone has  been decreased to 100 mg daily.   He has had some mild renal insufficiency.   1. He is on Flomax 0.4.  2. Coumadin.  3. Coreg 6.25 b.i.d.  4. Hydralazine 10 every 8 hours.  5. Imdur 10 every 8 hours.  6. Furosemide 40.  7. Calcitriol.  8. Aricept 10.  9. Levoxyl 100.  10.Amitriptyline 25.   Blood pressure is 157/75, pulse 52, sinus bradycardia.  GENERAL APPEARANCE:  Normal.  JVPs not elevated.  Carotid pulses are palpable and equal without  bruits.  LUNGS:  Clear.  CARDIAC:  no murmur or gallop  ABDOMEN:  Unremarkable.  EXTREMITIES:  No edema.   EKG and defibrillator to be evaluated.  I have suggested increasing the  Coreg to 12.5 b.i.d.  We will plan a BMP today.  I will see him back in  2 months or p.r.n.  We plan to up-titrate the Coreg to 25 b.i.d.  That  would be on return.   After reviewing the EKG with Dr. Juanda Chance, I have decided to not increase  his Coreg at this time. We will increase the hydralazine to 20 every 8  hours. I will see him back in 2 months or p.r.n. Have his blood pressure  checked in the interim.    Cecil Cranker, MD, [facility]  Electronically Signed   EJL/MedQ  DD: 09/13/2006  DT: 09/13/2006  Job #: [id number]

## 2010-12-23 NOTE — Op Note (Signed)
NAME:  [name], [name]                         ACCOUNT NO.:  [id number]   MEDICAL RECORD NO.:  [id number]                   PATIENT TYPE:  OIB   LOCATION:  4740                                 FACILITY:  [facility]   PHYSICIAN:  Doylene Canning. Ladona Ridgel, M.D. [facility]           DATE OF BIRTH:  Jul 12, 1925   DATE OF PROCEDURE:  07/22/2002  DATE OF DISCHARGE:                                 OPERATIVE REPORT   PROBLEM:  1. Electrophysiological study.  2. Implantable cardioverter defibrillator insertion.   CARDIOLOGIST:  Doylene Canning. Ladona Ridgel, M.D.   INDICATIONS FOR PROCEDURE:  The patient is a very pleasant 77 year old man  with a history of palpitations, ischemic cardiomyopathy, status post  myocardial infarction with an ejection fraction of 30%-35%, and nonsustained  VT on cardiac monitoring, with a left bundle branch block, who is referred  now for an invasive electrophysiologic study and possible ICD insertion.   DESCRIPTION OF PROCEDURE:  After an informed consent was obtained, the  patient was taken to the diagnostic ET  laboratory in the fasting state.  After the usual preparation and draping, intravenous fentanyl and midazolam  were given for sedation.  A 5-French quadripolar catheter was inserted  percutaneously in the right femoral vein and advanced to the RV apex.  A 5-  French quadripolar catheter was inserted percutaneously in the right femoral  vein and advanced to the His bundle region.  After measurement of the basic  intervals, rapid ventricular pacing was carried out from the RV apex at a  paced cycle length of 600 msec.  VH association was present at baseline.  S1, S2, and S1, S2, and S3, and S1, S2, S3 and S4 stimuli were delivered  with the S1, S2, the S2, S3, and the S3, S4 intervals step-wise decreased  down to ventricular fractions.  During programmed ventricular stimulation  from the RV apex, there was no inducible VT.  Next, the catheter was  maneuvered into the RV outflow track  region, and additional programmed  stimulation was delivered with S1, S2, and S1, S2, S3, and S1, S2, S3, and  S4 stimuli were delivered.  The S1, S2, and S2, S3, and S3, S4 intervals  were step-wise decreased down to ventricular refractiveness.  During pacing  an S1, S2, S3, S4 coupling interval of 400/270/220/260, there was inducible  sustained monomorphic VT.  This VT was a left bundle VT at a cycle length of  220 msec.  It could not be pace-terminated.  It was, however, subsequently  terminated with DC cardioversion.  At this point the patient was prepped for an ICD insertion.  After the usual  preparation and draping, additional intravenous fentanyl and midazolam were  given for sedation.  A total of 30 cc of lidocaine was infiltrated into the  left infraclavicular region.  A 9.0 cm incision was carried out over this  region, and electrocautery utilized to dissect down to the  subpectoralis  fascia.  The subclavian vein was subsequently punctured in the extrathoracic  position x2.  A Guidant model [id number] pacing lead was advanced into the  right atrium, and the Guidant model [id number] defibrillation lead was  advanced into the right ventricle.  At the final site R-waves measured 20  mV.  The pacing impedance was 552 ohms and the pacing threshold was 0.8 V at  0.5 msec, with the lead actively fixed.  Ten-volt pacing did not result in  diaphragmatic stimulation.  With the defibrillation lead in satisfactory  position, attention was turned to the atrial lead.  It was eventually placed  in the lateral wall of the right atrium where P-waves measured 2.5 to 3.0  mV, and the pacing threshold was 1 V at 0.5 msec, with a pacing impedance of  498 ohms.  With both atrial and ventricular leads in satisfactory position,  they were secured to the subpectoralis fascia with a figure-of-eight silk  suture.  In addition, the sewing sleeves were secured with silk suture.  Electrocautery was  utilized to make a subcutaneous pocket.  Kanamycin  irrigation was utilized to irrigate the pocket and electrocautery was  utilized to assure hemostasis.  The Guidant Prism-2 DR AICD model [id number],  serial [phone] was placed in the pocket and connected to the defibrillation  and atrial pacing leads.  A silk suture was utilized to secure the generator  to the fascial plane.  At this point defibrillation threshold testing was  carried out.  After the patient was more deeply sedated with intravenous fentanyl and  midazolam, a T-wave shock was utilized to induce VF.  A 14 joule shock  terminated ventricular fibrillation, restoring sinus rhythm.  Five minutes  was allowed to elapse and a second DFT test was carried out.  Again the VF  was induced with a T-wave shock, and a 14 joule shock terminated ventricular  fibrillation, restoring sinus rhythm.  At this point no additional DFT  testing was carried out, and the incision was closed with a layer of #2-0  Vicryl, followed by a layer of #3-0 Vicryl, followed by a layer of #4-0  Vicryl.  Benzoin was painted on the skin.  Steri-Strips were applied, and a  pressure dressing placed.  The patient returned to his room in satisfactory condition.   COMPLICATIONS:  There were no immediate procedural complications.   RESULTS:  This demonstrates inducible monomorphic ventricular tachycardia in  a patient with an ischemic cardiomyopathy and a left bundle branch block,  with an ejection fraction of 30%-35%.  It also demonstrates very poor  arterioventricular node conduction with an HV interval of 85 msec.  The  patient subsequently has undergone dual chamber  implantable cardioverter  defibrillator insertion, secondary to the MADDI-1 criteria.  There were no  immediate procedural complications.                                                Doylene Canning. Ladona Ridgel, M.D. [facility]    GWT/MEDQ  D:  07/22/2002  T:  07/22/2002  Job:  [id number]  cc:   Cecil Cranker,  M.D. [facility]   Alroy Dust, M.D.   Kathrine Cords, P.A.  [facility]

## 2010-12-23 NOTE — Discharge Summary (Signed)
[name], RISE NO.:  [id number]   MEDICAL RECORD NO.:  [id number]          PATIENT TYPE:  INP   LOCATION:  4732                         FACILITY:  [facility]   PHYSICIAN:  Cecil Cranker, M.D.DATE OF BIRTH:  [birth year]   DATE OF ADMISSION:  10/28/2005  DATE OF DISCHARGE:  11/05/2005                                 DISCHARGE SUMMARY   PRINCIPAL DIAGNOSES:  1.  Congestive heart failure with decompensated ischemic cardiomyopathy.  2.  Anasarca.  3.  Acute renal failure on chronic renal failure  4.  Hypertension.   SECONDARY DIAGNOSES:  1.  History of coronary artery bypass graft in 1998.  2.  A history of colostomy, colectomy.  3.  History of prostate cancer.  4.  Arteriosclerotic peripheral vascular disease.  5.  Hyperlipidemia.   PROCEDURES PERFORMED DURING THIS HOSPITALIZATION:  1.  Echocardiogram on October 23, 2005.  2.  Echocardiogram of October 30, 2005.  3.  An adenosine Myoview on November 02, 2005/   HISTORY OF PRESENT ILLNESS:  This is a 77 year old male who had presented to  the hospital with progressive dyspnea, scrotal edema and lower extremity  edema.  He had been previously seen by Rubye Oaks, N.P., and Dr. Corinda Gubler  for evaluation of his dyspnea and edema, where they had made some  adjustments in his Lasix dose, however did not have any improvement.  The  patient was admitted for exacerbation of his CHF.   HOSPITAL COURSE:  Patient admitted on October 28, 2005, IV Lasix, and an  echocardiogram was obtained.  His echocardiogram showed a normal ejection  fraction of 50-55%, no left ventricular regional wall motion abnormalities.  The left ventricular wall thickness was mildly to moderately increased and  the aortic valve thickness was mildly increased.  The patient was originally  admitted by Dr. Shan Levans and was transferred to cardiology service  after a diagnosis of CHF was obtained.  The patient also had some paroxysmal  atrial  fibrillation with some increased heart rate, which was contributing  to his acute on chronic CHF.  His amiodarone was increased.  The patient  continued to be diuresed during the hospitalization.  He did have a rise in  his creatinine at 2.4.  Nephrology consulted and stated they were hesitant  to place the patient on an ACE or an ARB at that point.  EP was consulted on  November 01, 2005, for tachycardia and atrial fibrillation with a rapid  ventricular rate.  The patient continued to diurese and his renal function  was returning to baseline.  An adenosine Myoview was performed on November 02, 2005, which showed fixed defects anteroseptal, inferolateral walls, small  amount of peri-infarct inducible ischemia with an EF of around 15%, severe  hypokinesis and paradoxical motion in the septum.  Renal saw the patient on  October 24, 2005.  Dr. Caryn Section stated that Lasix could be switched from IV to p.o.  and continue his calcitriol for his increased pH.  Dr. Daleen Squibb saw the patient  on November 04, 2005.  The patient continued to do better.  On November 05, 2005,  Dr. Myrtis Ser saw the patient and stated the patient was stable for discharge  home.   The patient will be discharged on the following medications:  1.  Tamsulosin 0.4 mg q.h.s.  2.  Aspirin 81 mg daily.  3.  Aricept 10 mg q.h.s.  4.  Guaifenesin 600 mg b.i.d.  5.  Potassium chloride 20 mEq t.i.d.  6.  Coumadin 5 mg daily.  7.  Amitriptyline 25 mg q.h.s.  8.  Coreg 3.125 mg b.i.d.  9.  Calcitriol 0.5 mcg daily.  10. Hydralazine 10 mg q.8h.  11. Isosorbide dinitrate 10 mg q.8h.  12. Synthroid 100 mcg daily.  13. Lasix 40 mg b.i.d.  14. Amiodarone 200 mg daily.  15. Xopenex.  16. Mucinex.  17. Xanax 0.25 mg q.8h. as needed.   Discharge laboratories showed a white blood cell count of 4.7, hemoglobin  13.4, hematocrit of 40.0, platelet count 189.  Sodium of 139, potassium 4.8,  chloride of 102, glucose of 88, creatinine of 2.6.  This patient will have  a  follow-up appointment with Dr. Corinda Gubler, office will call, in one to two  weeks, also an INR check office, will call.  Creatinine will need to be  monitored in post-hospital.   TOTAL TIME:  45 minutes including M.D. and N.P. time.     ______________________________  April Humphrey, NP    ______________________________  [name], M.D.    AH/MEDQ  D:  12/27/2005  T:  12/27/2005  Job:  [id number]

## 2010-12-23 NOTE — Discharge Summary (Signed)
[place]. [facility]  Patient:    [name], [name]                      MRN: [id number] Adm. Date:  [id number] Attending:  Alric Quan Dictator:   Jacolyn Reedy, P.A.C. CC:         Cecil Cranker, M.D. [facility]             Scott M. [name], M.D. [facility]             Tamario L. Deterding, M.D.                  Referring Physician Discharge [facility]  ADMITTING DIAGNOSIS:  Acute myocardial infarction.  DISCHARGE DIAGNOSES: 1. Abdominal pain, ST elevation on EKG, hypotension, and junctional bradycardia,    question etiology.  Acute catheterization revealed patent grafts, ejection    fraction 45%. 2. Acute renal failure, resolving, question etiology, probably multifactorial. 3. Status post emergency coronary artery bypass grafting x 8 in 1998,    three-and-a-half month hospitalization.  He had a left internal mammary    artery to the left anterior descending artery and second diagonal, saphenous    vein graft to the first diagonal, saphenous vein graft to the first obtuse    marginal, saphenous vein graft to the ______ and posterior descending artery    continuing to the right posterolateral artery. 4. 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 5. Chronic obstructive pulmonary disease. 6. History of atrial flutter and nonsustained ventricular tachycardia, treated ith    amiodarone. 7. Hypertension. 8. Hypercholesterolemia.  BRIEF HISTORY AND PHYSICAL:  Please see H&P for details.  This is a 77 year old who had an emergency CABG in 1998 who presented to Dr. Kirstie Mirza office complaining of  two-week history of abdominal pain off and on that worsened the morning of admission.  EKG in the office showed a junctional bradycardia with ST elevation in II, III, AVL, and AVF.  He had a systolic blood pressure of 80 on Doppler.  The  patient  denied any chest pain, shortness of breath, or diaphoresis.  He was transferred urgently by EMS to the catheterization laboratory, and underwent acute cardiac catheterization by Dr. Chales Abrahams.  He did not find any high-grade stenosis o cause his EKG changes and hypotension and bradycardia.  Left main had moderate disease, approximately 50%.  LAD was hazy, ostial 70-80% stenosis.  He had occluded diagonal via the SVG, distal via the LIMA to the diagonal two and LAD.  The circumflex was occluded mid, after the OM-1.  He had an OM-2 via the SVG.  RCA as dominant, occluded proximally.  PDA and distal PV branches via an SVG. Patients grafts were patent, and ejection fraction was 45%.  The patient went into acute renal failure.  The patient has been on Accupril for two years, and had stable renal function within the past year.  He was treated ith emergency dialysis and was doing much better.  The patient also developed anemia, was guaiac negative, and did require a transfusion.  He had one TSH drawn that as elevated at 10, but repeat came back at 2.  The patient gradually improved, was  feeling much better, had no further abdominal pain.  His acute renal failure may have had an element of obstruction, given he gives the history of prostatism symptoms for  some time, along with an ACE inhibitor and dye load.  The patient gradually improved and was stable for discharge on November 01, 1999.  LABORATORY VALUES:  Hemoglobin on March 21 was 11.7, hematocrit 34, white count  11.8, platelets 317.  Hemoglobin dropped down to 8.6, hematocrit 25.  Repeat on  March 26 hemoglobin 9.6, hematocrit 27.  That was after a transfusion. Coagulation times were within normal limits on admission.  Renal function on admission showed sodium 124, potassium 6.6, chloride 97, CO2 11, BUN 149, and creatinine 10.4. Prior to discharge, his sodium was 129, potassium 4.2, chloride 103, CO2 17, BUN 53, creatinine 2.5.   ALP was slightly low at 38.  Phosphorus was high at 6.6. Ks were all low, MBs were high at 5.7, 4.9, and 4.4.  Troponins were negative x 3.  Initial TSH came back a 20.7, repeat came back at 2.36.  Hepatitis B surface antigen was negative, hepatitis C antibody was negative.  Amiodarone level is still pending.  Urine creatinine was 104.1, urine sodium was 22.  Urinalysis showed hyaline casts noted, otherwise normal.  Blood type is A positive.  Radiology - Chest x-ray showed no active disease.  Renal ultrasound showed no hydronephrosis associated with either kidney.  There is a 2.2 cm simple cyst associated on the right kidney.  MRI of the abdomen showed bilateral cortical atrophy of the kidneys, no hydronephrosis, no incidental renal masses.  MRA of the abdomen showed a widely  patent single left renal artery.  There was a very tortuous right renal artery,  which does show some evidence of proximal disease, although a high-grade stenosis is not identified on the MRA study.  Should it become critical in delineating whether a treatable significant stenosis is present on the right, arteriography  could be performed if clinically indicated.  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  EKG - On admission, showed junctional bradycardia with ST elevation in II, III,  AVL, AVF.  Follow-up EKG showed resolution of the ST elevation and return to normal sinus rhythm.  DISPOSITION:  Patient will be discharged home in stable condition on the following medications.  DISCHARGE MEDICATIONS: 1. Amiodarone 200 mg q.d. 2. Lopressor 50 mg one-half tablet b.i.d. 3. Coated aspirin once a day. 4. Amitriptyline 10 mg 2 q.h.s. 5. Albuterol 90 mEq ______ 6. Alprazolam p.r.n. 7. Nitroglycerin p.r.n.  ACTIVITY:  As tolerated.  FOLLOW-UP:  He should have a BMP checked next week.  He is to see Dr. [name] next week and Dr. Glennon Hamilton in two to three weeks. DD:   11/01/99 TD:  11/01/99 Job: [id number]

## 2010-12-23 NOTE — Consult Note (Signed)
[place]. [facility]  Patient:    [name], [name]                      MRN: [id number] Adm. Date:  [id number] Attending:  Alric Quan Dictator:   [phone]                          Consultation Report  REASON FOR CONSULTATION:  Acute renal failure.  HISTORY OF PRESENT ILLNESS:  This is a 77 year old gentleman who has a history f coronary artery disease, status post CABG that was very complicated in December 1998 and had an ischemic colitis requiring colostomy.  He had a stormy  postoperative course.  He had a skin flap, had mild transient acute renal failure, had ARDS, a long ventilatory stay.  History of hyperlipidemia, ventricular and atrial dysrhythmias.  He presented to the office today complaining of weakness,  inability to stand.  He was found to have a decreased heart rate and junctional  bradycardia and EKG showed evidence of a DMI.  He has history of not feeling good for about a month and a week of dizziness when he gets up and cannot even get up today.  In the past 24 hours, he could not get out of the chair without dizziness. He has had nausea and vomiting today, lower abdominal pain for two or three weeks, decreased energy, no shortness of breath.  He sleeps on two pillows at night. e has nocturia x 1 to 2 but difficulty voiding.  He has no hematuria, stones, history of urinary tract infection.  He did have hematuria and was seen by Dr. Vonita Moss  during his hospitalization last time which was in early 1999.  His creatinine 1.5 on July 2000.  He has no history of nonsteroidals but has been Accupril.  ALLERGIES:  DYE and SOME ANTIBIOTIC, he does not know what antibiotic.  MEDICATIONS AT HOME: 1. Metoprolol 25 b.i.d. 2. Amiodarone 200 a day. 3. Aspirin 81 mg a day. 4. Accupril 20 mg a day. 5. Amitriptyline 20 mg a day h.s. 6. Albuterol two puffs q.i.d. p.r.n. 7. Alprazolam 0.5 mg one-half to one p.r.n.  PAST MEDICAL HISTORY:   Includes the arrhythmias, ischemic bowel, CABG, history f acute renal failure, cutaneous fistula at the time of his bowel resection. Wound dehiscence and flap ___ .  FAMILY HISTORY:  Positive for coronary disease.  SOCIAL HISTORY:  He is married.  He quit cigarettes a number of years ago.  He s a nondrinker.  REVIEW OF SYSTEMS:  HEENT:  Denies any visual difficulties.  He does have some ge related difficulties with that.  No difficulty hearing or headaches.  CARDIAC: As mentioned above.  No early chest pain, dyspnea, weakness, bradycardia. GI: As mentioned above.  PULMONARY:  No cough or shortness of breath.  SKIN:  Other than dry skin.  MUSCULOSKELETAL:  Various aches and pains, no focal problems.  OBJECTIVE PHYSICAL EXAM:  GENERAL:  A very pleasant, elderly gentleman.  He was pale and obviously weak.   VITAL SIGNS:  Blood pressure 80 going to 130 after an amp of calcium, amp of glucose and 5 units of regular insulin.  Heart rate is 59 and paced.  HEENT:  Benign.  CARDIOVASCULAR:  Exam regular rhythm, grade 2/6 systolic murmur heard best at the apex.  PMI is 12.5 at the fifth intercostal space.  Edema 1+.  Bilateral femoral bruits, no dorsalis  pedal pulse on the right, 1+ on the left.  LUNGS:  Reveal no rales, rhonchi or wheezes.  ABDOMEN:  Positive bowel sounds. Colostomy right lower quadrant.  Question palpable bladder.  Large midline defect where he has flap, no skin flap is ___ .  LABORATORY DATA:  Sodium is 124, potassium 6.6, chloride 97, bicarb 11, creatinine 10.4, BUN 149, glucose 133.  Hemoglobin 11.7, white count 11,800.  ASSESSMENT: 1. Acute renal failure probably secondary to Accupril versus obstruction or other    causes. Increased potassium is very significant, especially with this kind of    calcium response.  Need to watch him closely and probably repeat that.  Need to    treat his acute problem at this time and then do some investigation.  He  is lso    anemic and this is probably has been going on a good while. 2. Coronary disease. 3. History of ischemic colitis. 4. Peripheral vascular disease. 5. History of increased lipid.  PLAN: 1. Calcium, glucose, insulin, bicarbonate. 2. Hemodialysis. 3. Foley. 4. Ultrasound. 5. Urinalysis. DD:  10/26/99 TD:  10/26/99 Job: [id number]

## 2010-12-23 NOTE — Assessment & Plan Note (Signed)
[facility]                              CARDIOLOGY OFFICE NOTE   NAME:[name] [name]                      MRN:          [id number]  DATE:03/20/2006                            DOB:          04/20/1925    [name] [name] is a very pleasant 77 year old married white male with a long  history of coronary artery disease, ischemic cardiomyopathy, implantable  cardioverter/defibrillator after an episode of ventricular failure.  He also  has a history of atrial flutter, for which he takes amiodarone.  CABG was in  1999 with multiple complications, as previously outlined.  Over the past two  years, he has had significant intraventricular conduction delay.  He had an  episode of congestive heart failure in March 2007.  He is now much improved  and having no cardiac symptoms.   MEDICATIONS:  1. KCL 20 t.i.d.  2. Coumadin.  3. Lasix 40.  4. Coreg 6.25 b.i.d.  5. Levoxyl.  6. Amitriptyline 25.  7. Flomax 0.4.  8. Aricept.  9. Amiodarone 200.  10.Isordil 10 q.8h.  11.__________ q.8h.   PHYSICAL EXAMINATION:  VITAL SIGNS:  Blood pressure 134/74, pulse 67, normal  sinus rhythm.  GENERAL APPEARANCE:  Normal.  NECK:  JVP is not elevated.  Carotid pulses are palpable without bruits.  LUNGS:  Clear.  CARDIAC:  Normal.  EXTREMITIES:  No edema.   EKG reveals a normal sinus rhythm with first-degree AV block, QRS 210 ms.   DIAGNOSIS:  As above.   PLAN:  Plan to change him to Coreg long acting 20 mg daily.  I will see him  back in 2-3 months.  Will plan a 2D echo.  Also have a BNP today.  Will up-  titrate his Coreg as tolerated.                             Cecil Cranker, MD, [facility]    EJL/MedQ  DD:  03/20/2006  DT:  03/20/2006  Job #:  [id number]

## 2010-12-23 NOTE — Assessment & Plan Note (Signed)
[facility]                            CARDIOLOGY OFFICE NOTE   NAME:[name], [name]                      MRN:          [id number]  DATE:07/03/2006                            DOB:          1925/05/13    [name] is a very pleasant 77 year old white married male with a  long history of coronary artery disease, ischemic cardiomyopathy,  implantable cardioverter-defibrillator after an episode of ventricular  fibrillation.   He also has a history of atrial flutter, for which he takes amiodarone.   He had CABG in 1999 with multiple complications as previously outlined.  Over the past 2 years he has had a significant IV conduction delay and  had an episode of CHF in March 2007.  He is much improved at this time  and has no cardiac complaints.   His EKG reveals sinus tachycardia at a rate of 53, PR interval is 202  msec, which is shorter than the 232 seen on August 14, and the QRS  duration is slightly increased at 260.   MEDICATIONS:  1. Flomax 0.4 mg.  2. Coumadin.  3. Coreg 6.25 mg b.i.d.  4. Hydralazine 10 mg every 8 hours.  5. Imdur 10 mg every 8 hours.  6. Furosemide 40 mg.  7. Calcitriol 0.5 mcg.  8. Aricept 10 mg.  9. Levoxyl 100 mcg.  10.Amitriptyline 25 mg.  11.Amiodarone 200 mg daily.   PHYSICAL EXAMINATION:  VITAL SIGNS:  Blood pressure 132/76, pulse 53,  sinus bradycardia.  GENERAL APPEARANCE:  Stable.  NECK:  JVP is not elevated.  Carotid pulses palpably equal without  bruits.  LUNGS:  Clear.  CARDIAC:  No murmur or gallop.  EXTREMITIES:  No edema.   I would suggest continuing the same therapy; however, we plan to  decrease the amiodarone to 100 mg daily.  We will plan to see him back  in 6-8 weeks or p.r.n.  He should have a CMET and a TSH in the near  future.   I should note, his  basic metabolic panel revealed normal electrolytes,  BUN was 40 with creatinine 2.7, which was stable.   ADDENDUM:  Also of note is the  patient has not seen Dr. Ladona Ridgel recently.  I have asked him to see him concerning his defibrillator and also .  Cecil Cranker, MD, [facility]  Electronically Signed    EJL/MedQ  DD: 07/03/2006  DT: 07/03/2006  Job #: [id number]   cc:   Dyke Maes, M.D.

## 2010-12-23 NOTE — Cardiovascular Report (Signed)
[place]  Patient:    [name], [name]                      MRN: [id number] Proc. Date: 10/26/99 Adm. Date:  [id number] Attending:  Alric Quan CC:         Veneda Melter, M.D. [facility]             [name], M.D. [facility]             Scott M. [name], M.D. [facility]                        Cardiac Catheterization  PROCEDURES PERFORMED: 1. Left heart catheterization. 2. Left ventriculogram. 3. Selective coronary angiography. 4. Angiography to saphenous vein and internal mammary bypass grafts. 5. Placement of temporary right ventricle pacemaker.  DIAGNOSES: 1. Native three-vessel coronary artery disease. 2. Mild left ventricular systolic function. 3. Patent saphenous vein and internal mammary bypass grafts. 4. Junctional escape rhythm.  INDICATIONS:  [name] is a 77 year old white male with a history of coronary artery disease, who has undergone coronary artery bypass graft surgery in December of 1998.  He has been doing well since then.  However, he presents today to the  office with a two-week history of abdominal discomfort which had progressively gotten worse.  In the office he was found to be hypotensive and ECG showed ST elevation in the inferior leads with junctional rhythm of approximately 35 beats per minute.  The patient was transferred to [facility] for urgent left heart  catheterization and placement of temporary RV pacer.  TECHNIQUE:  After informed consent was obtained, the patient was brought to the  cardiac catheterization lab where both groins were sterilely prepped and draped. Lidocaine 1% was used to infiltrate the right groin, and a 6 [place] arterial and 6 French venous sheath placed using the modified Seldinger technique.  A temporary RV pacer was then passed under fluoroscopy into the apex of the right ventricle and appropriate pacing parameters set.  The patient had an intrinsic heart rate of 35-40 and  with pacing at 60 and increasing to 70 and 80 beats per minute, had very slight improvement of systolic blood pressure ranging from 65 to 80.  Left heart catheterization was then performed via the right femoral artery.  A  French Judkins left catheter was used to engage the left coronary artery. Next, a JR4 catheter was used to engage the native right as well as the vein grafts to he right coronary artery.  Selective angiography was performed of the Y graft to the first diagonal branch as well as the distal marginal with a JR4 catheter.  This was then placed in the left subclavian artery engaging the internal mammary graft to the second diagonal branch with continuation to distal LAD.  A 6 French pigtail  catheter was then advanced to the left ventricle and the left ventriculogram performed using power injections of contrast.  The 6 [place] LCD catheter was then reengaged in the vein graft to the diagonal and marginal and repeat angiography  performed.  The catheter was then removed and the sheath was secured into position at the termination of the case.  The patient was started on dopamine for hypotension with gradual increase in his blood pressure as well as an intrinsic  heart rate.  By the termination of the case, he had a heart rate of 70-80 beats per minute.  This appeared to be sinus with a blood pressure of 100 to 105 systolic  over 40 diastolic.  He tolerated the procedure well and was transferred to the CU in stable condition.  FINDINGS: 1. Left main trunk:  The left main trunk moderate diffuse disease with narrowing    of 50% in the distal section. 2. Left anterior descending:  This is a large caliber vessel that provides two    diagonal branches.  There is a hazy ostial narrowing of 70-80%.  The proximal    and mid section of the LAD has moderate diffuse disease.  The first and second    diagonal branches are occluded in their proximal segment.  The  first diagonal    branch is seen to fill via a saphenous vein graft and has mild irregularities.    It appears to bifurcate after the anastomosis.  The second diagonal branch fills    via a LIMA graft and has mild irregularities.  The distal LAD fills via native    flow as well as competitive flow from a LIMA graft which is sequentially placed    after the anastomosis with a second diagonal branch. 3. Left circumflex artery:  This is a medium caliber vessel that provides two    marginal branches.  The left circumflex system has moderate diffuse disease n    its proximal segment.  It is then occluded in the mid section after the first    marginal branch.  The first marginal branch had mild diffuse disease.  The    second marginal branch is seen to fill via a saphenous vein graft and has mild    irregularities.  Retrograde filling is noted into a small AV circumflex that is    severely diseased. 4. Right coronary artery:  This is a medium caliber vessel that provides the    posterior descending artery as well as several posterior ventricular branches in    its terminal segment.  The right coronary artery is occluded in its proximal  segment.  The distal vessels are seen to fill via a saphenous vein graft and    appear to have adequate perfusion. 5. Saphenous vein graft to OM-2 with Y connection to first diagonal.  Patent.    There is mild aneurysmal dilatation in the proximal segment of the vein graft    to the first diagonal branch.  This then tapers significantly as it approaches    the native vessel with the diameter of the distal saphenous vein graft    approaching the diameter of the diagonal branch.  The saphenous vein graft    portion to the second marginal branch is patent.  There is a long area in the    mid section with mild disease of 30%.  After initiation of dopamine there    appeared to be some spasm with reduction of lumen to 60-70%.  However, flow    through the  marginal branch was brisk. 6. Saphenous vein graft to right coronary artery is patent with mild    irregularities.  7. LIMA to second diagonal branch is patent.  This is a small caliber vessel. he    continuation from the second diagonal branch to the distal LAD is a small    vessel and appears to provide insignificant flow.  LEFT VENTRICULOGRAM:  Mildly dilated end-systolic and end-diastolic dimensions.  Overall left ventricular function is mildly impaired. Ejection fraction approximately 45%.  There is akinesis of the basilar inferior wall  with severe hypokinesia of the mid inferior wall.  There is no mitral regurgitation.  LV pressure initially was 70/5 and aortic was 70/30, LVEDP equals 13.  ASSESSMENT AND PLAN:  [name] is a 77 year old white male with native three-vessel coronary artery disease.  He appears to be well revascularized. He has mild left ventricular dysfunction with conduction at the ______ and hypotension out of proportion to his coronary anatomy and left ventricular function.  With improvement of heart rate to 70 and what appears to be resumption of normal sinus rhythm, he had prompt increase in blood pressure to baseline at  approximately 100 to 110 systolic, and it is thus felt that the patients dysrhythmia may be the primary cause of his symptoms.  At this point we will assess the patient in CCU.  We will wean his dopamine as tolerated and assess the patient for the need for permanent pacemaker.  At the termination of the case, baseline labs returned showing a potassium of 6.6. DD:  10/26/99 TD:  10/27/99 Job: [id number]

## 2010-12-25 ENCOUNTER — Encounter: Payer: Self-pay | Admitting: *Deleted

## 2010-12-28 ENCOUNTER — Encounter: Payer: Self-pay | Admitting: Pulmonary Disease

## 2011-01-05 ENCOUNTER — Encounter: Payer: Self-pay | Admitting: Internal Medicine

## 2011-01-09 ENCOUNTER — Encounter: Payer: Self-pay | Admitting: Internal Medicine

## 2011-01-09 NOTE — Progress Notes (Signed)
This encounter was created in error - please disregard.

## 2011-01-10 ENCOUNTER — Encounter: Payer: Self-pay | Admitting: Internal Medicine

## 2011-01-18 ENCOUNTER — Other Ambulatory Visit: Payer: Self-pay | Admitting: Internal Medicine

## 2011-01-26 ENCOUNTER — Other Ambulatory Visit: Payer: Self-pay | Admitting: Pulmonary Disease

## 2011-02-02 ENCOUNTER — Other Ambulatory Visit: Payer: Self-pay | Admitting: *Deleted

## 2011-02-02 MED ORDER — ALPRAZOLAM 0.5 MG PO TABS: 0.5000 mg | ORAL_TABLET | Freq: Three times a day (TID) | ORAL | Status: DC | PRN

## 2011-02-03 ENCOUNTER — Telehealth: Payer: Self-pay | Admitting: Pulmonary Disease

## 2011-02-03 NOTE — Telephone Encounter (Signed)
Rx was sent electronically 01-27-11 for #90 with refills; pharmacy stated they never got the medication-I gave a verbal Rx to pharmacy and the patients wife is aware pharmacy is getting medication ready for pick up.

## 2011-02-04 ENCOUNTER — Other Ambulatory Visit: Payer: Self-pay | Admitting: Internal Medicine

## 2011-02-28 ENCOUNTER — Other Ambulatory Visit: Payer: Self-pay | Admitting: *Deleted

## 2011-02-28 MED ORDER — OMEPRAZOLE 40 MG PO CPDR: 40.0000 mg | DELAYED_RELEASE_CAPSULE | Freq: Every day | ORAL | Status: DC

## 2011-02-28 MED ORDER — AMITRIPTYLINE HCL 25 MG PO TABS: 25.0000 mg | ORAL_TABLET | Freq: Every day | ORAL | Status: DC

## 2011-03-02 ENCOUNTER — Other Ambulatory Visit: Payer: Self-pay | Admitting: Internal Medicine

## 2011-03-02 ENCOUNTER — Ambulatory Visit (INDEPENDENT_AMBULATORY_CARE_PROVIDER_SITE_OTHER): Payer: Medicare Other | Admitting: *Deleted

## 2011-03-02 DIAGNOSIS — Z95810 Presence of automatic (implantable) cardiac defibrillator: Secondary | ICD-10-CM

## 2011-03-02 DIAGNOSIS — I472 Ventricular tachycardia: Secondary | ICD-10-CM

## 2011-03-02 DIAGNOSIS — I5022 Chronic systolic (congestive) heart failure: Secondary | ICD-10-CM

## 2011-03-04 LAB — REMOTE ICD DEVICE
AL IMPEDENCE ICD: 620 Ohm
ATRIAL PACING ICD: 0 pct
CHARGE TIME: 8.9 s
DEV-0020ICD: NEGATIVE
DEVICE MODEL ICD: 149416
FVT: 0
TOT-0006: 20120426000000
TZAT-0001FASTVT: 2
TZAT-0013FASTVT: 2
TZAT-0018FASTVT: NEGATIVE
TZST-0001FASTVT: 3
TZST-0001FASTVT: 4
TZST-0001FASTVT: 7
TZST-0001FASTVT: 8
TZST-0003FASTVT: 23 J
TZST-0003FASTVT: 41 J
TZST-0003FASTVT: 41 J
TZST-0003FASTVT: 41 J
VENTRICULAR PACING ICD: 2 pct
VF: 0

## 2011-03-07 NOTE — Progress Notes (Signed)
ICD remote 

## 2011-03-09 ENCOUNTER — Telehealth: Payer: Self-pay | Admitting: Pulmonary Disease

## 2011-03-09 MED ORDER — AMITRIPTYLINE HCL 25 MG PO TABS: 25.0000 mg | ORAL_TABLET | Freq: Every day | ORAL | Status: DC

## 2011-03-09 NOTE — Telephone Encounter (Signed)
rx for the amitriptyline has been sent to the pharmacy for 90 day supply with 3 refills.  Pt has appt on 8-16.  Spoke with pts wife and she is aware of rx sent to the pharmacy

## 2011-03-17 ENCOUNTER — Telehealth: Payer: Self-pay | Admitting: Internal Medicine

## 2011-03-17 ENCOUNTER — Telehealth: Payer: Self-pay | Admitting: *Deleted

## 2011-03-17 NOTE — Telephone Encounter (Signed)
ICD remote received one ATR episode 03/03/11 lasting 1 minute.  Patient aware.  No further follow up necessary.

## 2011-03-17 NOTE — Telephone Encounter (Signed)
I spoke with [name] to get permission to speak with [name], the patient's step daughter. In speaking with the patient's family, he has been having low BP's. There was no comment made on his HR per [name], but he is concerned the patient's ICD may need to be adjusted. The patient does remotes from home. They will try to send a transmission today. I will forward to [place] to be looking for this.

## 2011-03-17 NOTE — Telephone Encounter (Signed)
Per Wife pt has been having his INR's checked at [facility]. She states that she will follow up with them on Monday. She feels that there was a new employee in the office that faxed [place] the result by mistake. She will follow up with [place] if she has any further questions.

## 2011-03-17 NOTE — Telephone Encounter (Signed)
Daughter called and said that fathers blood pressure is dropping and [name] said it may be his PM. Dr. Ladona Ridgel does not have an opening for a while.  Please call and advise her cell number is [phone]

## 2011-03-17 NOTE — Telephone Encounter (Signed)
PT was faxed today from PCP.  Was told we would call and advise him of what to take.  Please call pt at  [phone].

## 2011-03-23 ENCOUNTER — Ambulatory Visit: Payer: Medicare Other | Admitting: Pulmonary Disease

## 2011-03-25 ENCOUNTER — Other Ambulatory Visit: Payer: Self-pay | Admitting: Internal Medicine

## 2011-03-29 ENCOUNTER — Encounter: Payer: Self-pay | Admitting: Pulmonary Disease

## 2011-03-29 ENCOUNTER — Ambulatory Visit: Payer: Medicare Other | Admitting: Pulmonary Disease

## 2011-04-03 ENCOUNTER — Encounter: Payer: Self-pay | Admitting: Pulmonary Disease

## 2011-04-04 ENCOUNTER — Ambulatory Visit (INDEPENDENT_AMBULATORY_CARE_PROVIDER_SITE_OTHER): Payer: Medicare Other | Admitting: Pulmonary Disease

## 2011-04-04 ENCOUNTER — Encounter: Payer: Self-pay | Admitting: Pulmonary Disease

## 2011-04-04 DIAGNOSIS — F411 Generalized anxiety disorder: Secondary | ICD-10-CM

## 2011-04-04 DIAGNOSIS — I10 Essential (primary) hypertension: Secondary | ICD-10-CM

## 2011-04-04 DIAGNOSIS — I5022 Chronic systolic (congestive) heart failure: Secondary | ICD-10-CM

## 2011-04-04 DIAGNOSIS — C61 Malignant neoplasm of prostate: Secondary | ICD-10-CM

## 2011-04-04 DIAGNOSIS — F068 Other specified mental disorders due to known physiological condition: Secondary | ICD-10-CM

## 2011-04-04 DIAGNOSIS — I4891 Unspecified atrial fibrillation: Secondary | ICD-10-CM

## 2011-04-04 DIAGNOSIS — E039 Hypothyroidism, unspecified: Secondary | ICD-10-CM

## 2011-04-04 DIAGNOSIS — N184 Chronic kidney disease, stage 4 (severe): Secondary | ICD-10-CM

## 2011-04-04 DIAGNOSIS — M159 Polyosteoarthritis, unspecified: Secondary | ICD-10-CM

## 2011-04-04 DIAGNOSIS — E7800 Pure hypercholesterolemia, unspecified: Secondary | ICD-10-CM

## 2011-04-04 DIAGNOSIS — I2510 Atherosclerotic heart disease of native coronary artery without angina pectoris: Secondary | ICD-10-CM

## 2011-04-04 DIAGNOSIS — I2589 Other forms of chronic ischemic heart disease: Secondary | ICD-10-CM

## 2011-04-04 MED ORDER — AMITRIPTYLINE HCL 25 MG PO TABS: 25.0000 mg | ORAL_TABLET | Freq: Every day | ORAL | Status: DC

## 2011-04-04 NOTE — Patient Instructions (Signed)
Today we updated your med list in EPIC...    Continue your current meds the same...    We refilled your Amitriptyline per request...  Let me know if I can be of service to you in any way.Marland KitchenMarland Kitchen

## 2011-04-09 ENCOUNTER — Encounter: Payer: Self-pay | Admitting: Pulmonary Disease

## 2011-04-09 NOTE — Progress Notes (Signed)
Subjective:    Patient ID: [name], male    DOB: 1924/11/03, 77 y.o.   MRN: [id number]  HPI 77 y/o WM here for a follow up visit... he has mult medical problems all expertly followed by his specialty physicians...   ~  October 11, 2010:  here for a 27mo ROV- wife isn't here today due to recent fall w/ lumbar compression fx... he continues to see his mult specialty physicians regularly>>    >> had Cards f/u DrTaylor 2/11 for ischemic cardiomyopathy, hx VT w/ implanted defib, AFib on Amio... ICD battery near end of life & wife very upset about conversation regarding poss not replacing the unit due to comorbidities etc> he had a new cardioverter defibrillator implanted by DrTaylor 5/11, and an antibiotic reaction to the Clindamycin used post op (he's PCN allergic)... most recent f/u 1/12- devise check OK, continue same meds.    >> had GI f/u DrStark 3/10- reflux w/ LER, cough, hoarse;  Rx'd Nexium & antireflux reg- improved... remote hx ischemic colitis w/ colectomy & perm ileostomy...    >> had Nephrology f/u DrDeterding 10/11 & 2/12- CKD w/ Creat= 2.4-2.8; RTA on Bicarb 2Bid w/ HCO3= 29; secondary hyperpara on Calcitriol 32mcg/d w/ Ca= 8.7-9.6, Phos= 3.8-4.8 & PTH= 50-33; Hg norm= 14.7 & Fe= 113... he is slated to view RenalFailure video in anticipation of ?dialysis...    >> had Urology f/u 11/11 now w/ DrWrenn- hx prostate cancer2003 w/ XRT, rising PSA in follow up w/ doubling time noted to be 12-32mo, & neg bone scan 12/11; bladder outlet obstruction on Flomax...    >> had f/u Ortho DrDuda 2/12- insensate neuropathy w/ onychomycotic nails; and post tibial tendon insuffic w/ pronated valgus forefoot...    >> he is cared for by LMD in [place] DrProchnau> episode of confusion 9/11 reported by wife...  ~  April 04, 2011:  27mo ROV & he reports that he is stable, doing reasonably well w/ his mult chronic illnesses; still sees DrDeterding, Nephrology as his main doctor (seen every 22mo w/ complete lab  panel done each visit); and sees DrProchnau in [place] for primary care needs;  He requests refill Amitriptyline today from me, 90d supply written & we discussed future follow ups here w/ me> I suggested that we not sched any routine follow up appts w/ me as all his needs are being met by his current medical team & that I would remain avail for Pulmonary problems or as he sees fit in the future...    >> he saw Urology 8/12 & felt to be stable, they were awaiting labs from DrDeterding's office for review (he continues to follow up regularly w/ Nephrology & they are managing his overall medical situation...          Problem List:  HYPERTENSION (ICD-401.9) - controlled on Rx w/ COREG 6.25Bid, ISOSORBIDE 10mg Tid, LASIX 40mg - taking 3/d (2am, 1pm)... Off prev Hydralazine, med adjustments by DrDeterding... BP= 128/70 and tolerating meds well...  denies HA, visual changes, CP, palipit, dizziness, syncope, dyspnea, edema, etc...   CORONARY ARTERY DISEASE (ICD-414.00) - S/P CABG x8 in 1998 by DrWilson for 3vessel CAD... followed by DrJoeLeB for yrs, now Print production planner... he gets his defib checked remotely on a regular basis... ~  cath 2001 showed 50%LMain, native 3 vessel dis, grafts OK, EF= 45%... ~  2DEcho 8/07 showed sl dil LV w/ EF= 20%, HK & AK diffusely, dil LA, mild AI, trivMR... ~  adm by Cards 8/08 for CP felt  to be non-cardiac...  ISCHEMIC CARDIOMYOPATHY (ICD-414.8) - he had defibrillator placed 2003 for VT... he is also on COUMADIN now followed in [facility]... he is followed regularly by DrTaylor...  2DEcho 8/07 w/ HK & AK, EF=20% (worse than 2006)... ~  adm 3/07 by Cards w/ decomp CHF & he was diuresed w/ help of Nephrology. ~  labs 6/10 showed BNP= 233 ~  5/11:  new cardioverter defibrillator placed by DrTaylor. ~  All labs now checked by DrDeterding every 97mo...  Hx of ATRIAL FLUTTER (ICD-427.32) - on AMIODARONE 200mg - 1/2 tab daily + COUMADIN w/ protimes monitored in [place] now... S/P AICD  12/03 & followed by DrTaylor...  PERIPHERAL VASCULAR DISEASE (ICD-443.9) - known mild carotid dis w/ 0-39% bilat, and mod right vertebral stenosis on CDoppler 12/08... normal Ao & iliacs 4/05 dopplers...  HYPERCHOLESTEROLEMIA (ICD-272.0) - on diet alone + FISH OIL...  ~  FLP 8/08 showed TChol 171, TG 150, HDL 51, LDL 90 ~  FLP 6/10 showed TChol 180, TG 180, HDL 52, LDL 92... continue fish oil, better low fat diet. ~  FLP 3/11 showed TChol 190, TG 199, HDL 61, LDL 89 ~  FLP 3/12 showed TChol 179, TG 202, HDL 50, LDL 92  HYPOTHYROIDISM (ICD-244.9) - on LEVOTHYROID 142mcg/d... ~  lab 8/08 w/ TSH= 1.51 ~  lab 6/10 showed TSH= 1.18... continue same. ~  lab 3/11 showed TSH= 1.46 ~  Labs 3/12 showed TSH= 3.12  Hx of ISCHEMIC COLITIS (ICD-557.9) - occured after his CABG in 1998... resulted in total colectomy and ileostomy... long post-op course w/ grafted abd wound etc... ileostomy working well- no complaints...  RENAL INSUFFICIENCY, CHRONIC (ICD-585.9) - followed by DrDeterding Q4-17months w/ labs etc... on CALCITRIOL 0.5mg /d and BICARB 2tabsBid for a RTA... ~  labs from Nephrology 11/09 showed BUN= 50, Creat= 2.1 ~  labs from Nephrology 3/10 showed BUN= 34, Creat= 1.92, HCO3= 27 ~  labs 3/11 showed BUN= 35, Creat= 2.2  Hx of PROSTATE CANCER (ICD-185) & BENIGN PROSTATIC HYPERTROPHY, WITH OBSTRUCTION (ICD-600.01) - followed by [id number] on FLOMAX 0.4mg /d...  History of prostate cancer in 2003 w/ XRT by DrGoodchild... ~  pt indicates that DrDavis follows his PSA's... ~  labs here 6/10 showed PSA=  3.74 ~  labs here 3/11 showed PSA= 5.46... copy to [facility]. ~  PSAs monitored by Urology w/ 12-60mo doubling time & neg bone scan 12/11, continues on watchful waiting.  DEGENERATIVE JOINT DISEASE, GENERALIZED (ICD-715.00) - he takes TYLENOL for pain & knows to avoid NSAIDS.  DEMENTIA, MILD (ICD-294.8) - on ARICEPT 10mg /d; family reports stable w/o noticeable deterioration...  ANXIETY (ICD-300.00) - on  AMITRIPTYLINE 25mg Qhs for sleep, and ALPRAZOLAM 0.5mg  Prn for nerves...   Past Surgical History  Procedure Date  . Coronary artery bypass graft 1998  . Total colectomy and ileostomy for ischemic colitis 1998    Outpatient Encounter Prescriptions as of 04/04/2011  Medication Sig Dispense Refill  . ALPRAZolam (XANAX) 0.5 MG tablet Take 1 tablet (0.5 mg total) by mouth 3 (three) times daily as needed for anxiety.  90 tablet  0  . amiodarone (PACERONE) 200 MG tablet Take 1/2 tablet by mouth once daily or as directed       . amitriptyline (ELAVIL) 25 MG tablet Take 1 tablet (25 mg total) by mouth at bedtime.  90 tablet  3  . calcitRIOL (ROCALTROL) 0.5 MCG capsule Take one tablet by mouth every other day Per [name]      . carvedilol (COREG) 6.25  MG tablet Take 6.25 mg by mouth 2 (two) times daily with a meal.        . donepezil (ARICEPT) 10 MG tablet Take 10 mg by mouth at bedtime as needed.        . fish oil-omega-3 fatty acids 1000 MG capsule Take 2 g by mouth daily.        . furosemide (LASIX) 40 MG tablet Take 2 tablets by mouth in the morning and 1 tablet in the evening      . isosorbide dinitrate (ISORDIL) 10 MG tablet TAKE ONE (1) TABLET(S) EVERY EIGHT (8) HOURS  90 tablet  0  . levothyroxine (SYNTHROID, LEVOTHROID) 100 MCG tablet Take one (1) tablet(s) once daily  90 tablet  3  . Multiple Vitamins-Minerals (MULTIVITAMIN & MINERAL PO) Take 1 tablet by mouth daily.        . nitroGLYCERIN (NITROSTAT) 0.4 MG SL tablet Place 0.4 mg under the tongue every 5 (five) minutes as needed.        Marland Kitchen omeprazole (PRILOSEC) 40 MG capsule Take 1 capsule (40 mg total) by mouth daily.  30 capsule  1  . sodium bicarbonate 648 MG tablet Take 1  tablets by mouth two times daily      . Tamsulosin HCl (FLOMAX) 0.4 MG CAPS Take 0.4 mg by mouth daily after supper. As directed by Dr. Earlene Plater       . warfarin (COUMADIN) 5 MG tablet take as directed by coumadin clinic  90 tablet  1  . DISCONTD: hydrALAZINE  (APRESOLINE) 10 MG tablet Take two (2) tablet by mouth every eight(8) hours  180 tablet  3    Allergies  Allergen Reactions  . Amoxicillin     REACTION: rash  . Clindamycin   . Penicillins     REACTION: rash    Current Medications, Allergies, Past Medical History, Past Surgical History, Family History, and Social History were reviewed in [employer] record.   Review of Systems        See HPI - all other systems neg except as noted...  The patient complains of decreased hearing, dyspnea on exertion, muscle weakness, and difficulty walking.  The patient denies anorexia, fever, weight loss, weight gain, vision loss, hoarseness, chest pain, syncope, peripheral edema, prolonged cough, headaches, hemoptysis, abdominal pain, melena, hematochezia, severe indigestion/heartburn, hematuria, incontinence, suspicious skin lesions, transient blindness, depression, unusual weight change, abnormal bleeding, enlarged lymph nodes, and angioedema.     Objective:   Physical Exam     WD, Overweight, 77 y/o WM in NAD... VITAL SIGNS:  Reviewed & BP= 128/70... GENERAL:  Alert, pleasant & cooperative... HEENT:  [place]/AT, EOM-wnl, PERRLA, EACs-clear, TMs-wnl, NOSE-clear, THROAT-clear & wnl, sl dry MM's... NECK:  Supple w/ fairROM; no JVD; normal carotid impulses w/o bruits; no thyromegaly or nodules palpated; no lymphadenopathy. CHEST:  Clear to P & A; without wheezes/ rales/ or rhonchi heard... HEART:  Regular Rhythm, gr 1-2/6 SEM S4 gallop, no rubs or S3... ABDOMEN:  Ileotomy in right side- functioning normally, large abd wound/ graft/ defect- w/ binder... no organomegaly or masses. EXT: without deformities, mild arthritic changes; no varicose veins/ +venous insuffic/ tr edema. NEURO:  CN's intact; periph neuropathy bilat w/o focal deficits... DERM:  No lesions noted; no rash etc...   Assessment & Plan:   HBP>  Controlled on his BBlocker, Nitrate, & Diuretics as adjusted by  Nephrology; continue same...  CAD>  Stable w/o clinical angina on his nitrate, s/p bypass 1998, followed by DrTaylor  for Cards; he is very sedentary 7 encouraged to incr his activity level/ exercise...  Ischemic Cardiomyopathy>  As noted followed by drTaylor & stable on current regimen...  Hx AFlutter>  Holding NSR on Amio 100mg /d, AICD monitored by Cards...  Chol>  On diet + Fish Oil...  Hypothyroid>  On synthroid 160mcg/d...  Hx Ischemic colitis w/ surg, has ileostomy that functions well...  Renal Failure> followed regularly by DrDeterding who has taken over his medical management in prep for poss dialysis...  Hx Prostate Cancer>  Local recurrent disease managed by DrWrenn for Urology on watchful waiting; also taking Flomax nightly for outlet symptoms...  DJD>  He is too sedentary, he uses Tylenol as needed & knows not to take NSAIDs...  Dementia>  Stable on Aricept w/ family home to control his meds etc...  Anxiety>  They request refill Amitriptline today;  They use the Alprazolam sparingly as directed.Marland KitchenMarland Kitchen

## 2011-04-11 ENCOUNTER — Telehealth: Payer: Self-pay | Admitting: Pulmonary Disease

## 2011-04-11 NOTE — Telephone Encounter (Signed)
Omeprazole APPROVED from 04/11/2011 to 04/09/2012 through [employer]. Member ID # is [id number]. Case # [id number].Pt and pharmacy aware of approval.

## 2011-05-01 ENCOUNTER — Telehealth: Payer: Self-pay | Admitting: Internal Medicine

## 2011-05-01 MED ORDER — ISOSORBIDE DINITRATE 10 MG PO TABS: 10.0000 mg | ORAL_TABLET | Freq: Three times a day (TID) | ORAL | Status: DC

## 2011-05-01 NOTE — Telephone Encounter (Signed)
Pt needs refill on isosorbide 10mg  called into [facility] on [address] in [place] [phone]

## 2011-05-16 ENCOUNTER — Other Ambulatory Visit: Payer: Self-pay

## 2011-05-16 MED ORDER — CARVEDILOL 6.25 MG PO TABS: 6.2500 mg | ORAL_TABLET | Freq: Two times a day (BID) | ORAL | Status: DC

## 2011-05-17 ENCOUNTER — Other Ambulatory Visit: Payer: Self-pay | Admitting: *Deleted

## 2011-05-17 MED ORDER — OMEPRAZOLE 40 MG PO CPDR: 40.0000 mg | DELAYED_RELEASE_CAPSULE | Freq: Every day | ORAL | Status: DC

## 2011-05-22 LAB — CARDIAC PANEL(CRET KIN+CKTOT+MB+TROPI)
CK, MB: 2.6
CK, MB: 2.8
CK, MB: 2.9
Relative Index: INVALID
Relative Index: INVALID
Relative Index: INVALID
Total CK: 55
Total CK: 55
Total CK: 67
Troponin I: 0.02
Troponin I: 0.03
Troponin I: 0.04

## 2011-05-22 LAB — TSH: TSH: 1.514

## 2011-05-22 LAB — PROTIME-INR
INR: 3.3 — ABNORMAL HIGH
INR: 3.3 — ABNORMAL HIGH
Prothrombin Time: 35.2 — ABNORMAL HIGH
Prothrombin Time: 35.8 — ABNORMAL HIGH

## 2011-05-22 LAB — LIPID PANEL
Cholesterol: 171
HDL: 51
Triglycerides: 150 — ABNORMAL HIGH

## 2011-06-01 ENCOUNTER — Other Ambulatory Visit: Payer: Self-pay | Admitting: Internal Medicine

## 2011-06-01 ENCOUNTER — Ambulatory Visit (INDEPENDENT_AMBULATORY_CARE_PROVIDER_SITE_OTHER): Payer: Medicare Other | Admitting: *Deleted

## 2011-06-01 ENCOUNTER — Encounter: Payer: Self-pay | Admitting: Internal Medicine

## 2011-06-01 DIAGNOSIS — I472 Ventricular tachycardia, unspecified: Secondary | ICD-10-CM

## 2011-06-04 LAB — REMOTE ICD DEVICE
ATRIAL PACING ICD: 0 pct
CHARGE TIME: 8.9 s
DEV-0020ICD: NEGATIVE
DEVICE MODEL ICD: 149416
FVT: 0
TOT-0006: 20120726000000
TZAT-0001FASTVT: 2
TZAT-0013FASTVT: 2
TZAT-0018FASTVT: NEGATIVE
TZST-0001FASTVT: 3
TZST-0001FASTVT: 4
TZST-0001FASTVT: 7
TZST-0001FASTVT: 8
TZST-0003FASTVT: 23 J
TZST-0003FASTVT: 41 J
TZST-0003FASTVT: 41 J
VENTRICULAR PACING ICD: 3 pct
VF: 0

## 2011-06-08 NOTE — Progress Notes (Signed)
ICD remote 

## 2011-06-28 ENCOUNTER — Other Ambulatory Visit: Payer: Self-pay | Admitting: *Deleted

## 2011-06-28 MED ORDER — ALPRAZOLAM 0.5 MG PO TABS: 0.5000 mg | ORAL_TABLET | Freq: Three times a day (TID) | ORAL | Status: DC | PRN

## 2011-07-11 ENCOUNTER — Other Ambulatory Visit: Payer: Self-pay | Admitting: *Deleted

## 2011-07-11 MED ORDER — ALPRAZOLAM 0.5 MG PO TABS: 0.5000 mg | ORAL_TABLET | Freq: Three times a day (TID) | ORAL | Status: AC | PRN

## 2011-07-18 ENCOUNTER — Ambulatory Visit: Payer: Medicare Other | Admitting: Pulmonary Disease

## 2011-08-16 ENCOUNTER — Encounter: Payer: Self-pay | Admitting: Internal Medicine

## 2011-08-18 ENCOUNTER — Other Ambulatory Visit: Payer: Self-pay | Admitting: Internal Medicine

## 2011-08-22 ENCOUNTER — Encounter: Payer: Self-pay | Admitting: Internal Medicine

## 2011-08-22 ENCOUNTER — Ambulatory Visit (INDEPENDENT_AMBULATORY_CARE_PROVIDER_SITE_OTHER): Payer: Medicare Other | Admitting: Internal Medicine

## 2011-08-22 DIAGNOSIS — I5022 Chronic systolic (congestive) heart failure: Secondary | ICD-10-CM

## 2011-08-22 DIAGNOSIS — Z95810 Presence of automatic (implantable) cardiac defibrillator: Secondary | ICD-10-CM

## 2011-08-22 DIAGNOSIS — I4891 Unspecified atrial fibrillation: Secondary | ICD-10-CM

## 2011-08-22 LAB — ICD DEVICE OBSERVATION
AL IMPEDENCE ICD: 686 Ohm
CHARGE TIME: 8.9 s
DEV-0020ICD: NEGATIVE
RV LEAD AMPLITUDE: 25 mv
TZAT-0001FASTVT: 2
TZAT-0013FASTVT: 2
TZON-0003FASTVT: 375 ms
TZST-0001FASTVT: 5
TZST-0001FASTVT: 6
TZST-0003FASTVT: 41 J
TZST-0003FASTVT: 41 J
TZST-0003FASTVT: 41 J

## 2011-08-22 MED ORDER — NITROGLYCERIN 0.4 MG SL SUBL: 0.4000 mg | SUBLINGUAL_TABLET | SUBLINGUAL | Status: AC | PRN

## 2011-08-22 NOTE — Assessment & Plan Note (Signed)
His device is working normally. We'll plan to recheck in several months. 

## 2011-08-22 NOTE — Assessment & Plan Note (Signed)
For now, I would recommend a continued strategy of rate control. We could uptitrate his amiodarone and proceed with cardioversion but I think it unlikely that he would maintain sinus rhythm and the likelihood of side effects on increased doses of amiodarone is significant.

## 2011-08-22 NOTE — Progress Notes (Signed)
HPI [name] returns today for followup. He is an 78 yo woman with a h/o persistent atrial fibrillation and chronic systolic heart failure. He also has hypertension. The patient remains sedentary. He denies anginal symptoms, or syncope. No recent ICD shocks. Allergies  Allergen Reactions  . Amoxicillin     REACTION: rash  . Clindamycin   . Penicillins     REACTION: rash     Current Outpatient Prescriptions  Medication Sig Dispense Refill  . ALPRAZolam (XANAX) 0.5 MG tablet Take 1 tablet (0.5 mg total) by mouth 3 (three) times daily as needed for anxiety.  90 tablet  5  . amiodarone (PACERONE) 200 MG tablet Take 1/2 tablet by mouth once daily or as directed       . amitriptyline (ELAVIL) 25 MG tablet Take 1 tablet (25 mg total) by mouth at bedtime.  90 tablet  3  . calcitRIOL (ROCALTROL) 0.5 MCG capsule Take one tablet by mouth every other day Per [name]      . carvedilol (COREG) 6.25 MG tablet Take 1 tablet (6.25 mg total) by mouth 2 (two) times daily with a meal.  60 tablet  4  . donepezil (ARICEPT) 10 MG tablet Take 10 mg by mouth at bedtime as needed.        . fish oil-omega-3 fatty acids 1000 MG capsule Take 2 g by mouth daily.        . furosemide (LASIX) 40 MG tablet Take 2 tablets by mouth in the morning and 1 tablet in the evening      . isosorbide dinitrate (ISORDIL) 10 MG tablet Take 1 tablet (10 mg total) by mouth 3 (three) times daily.  90 tablet  3  . levothyroxine (SYNTHROID, LEVOTHROID) 100 MCG tablet Take one (1) tablet(s) once daily  90 tablet  3  . Multiple Vitamins-Minerals (MULTIVITAMIN & MINERAL PO) Take 1 tablet by mouth daily.        . nitroGLYCERIN (NITROSTAT) 0.4 MG SL tablet Place 1 tablet (0.4 mg total) under the tongue every 5 (five) minutes as needed.  30 tablet  11  . omeprazole (PRILOSEC) 40 MG capsule Take 1 capsule (40 mg total) by mouth daily.  30 capsule  3  . sodium bicarbonate 648 MG tablet Take 1  tablets by mouth two times daily      .  Tamsulosin HCl (FLOMAX) 0.4 MG CAPS Take 0.4 mg by mouth daily after supper. As directed by Dr. Earlene Plater       . warfarin (COUMADIN) 5 MG tablet take as directed by coumadin clinic  90 tablet  1     Past Medical History  Diagnosis Date  . Hypertension   . CAD (coronary artery disease)   . Ischemic cardiomyopathy   . Chronic systolic heart failure   . Automatic implantable cardiac defibrillator in situ   . Atrial fibrillation   . Atrial flutter   . Peripheral vascular disease   . Hypercholesteremia   . Hypothyroid   . Colitis, ischemic   . Chronic renal insufficiency   . BPH with urinary obstruction   . History of prostate cancer   . DJD (degenerative joint disease)   . 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 Anxiety   . Anemia   . PVD (peripheral vascular disease)   . Mild memory disturbances not amounting to dementia     ROS:   All systems reviewed and negative except as noted in the HPI.   Past  Surgical History  Procedure Date  . Coronary artery bypass graft 1998  . Total colectomy and ileostomy for ischemic colitis 1998  . Doppler echocardiography 2006, 2007  . Cardiac catheterization 10/26/1999     Family History  Problem Relation Age of Onset  . Colon cancer Brother   . Esophageal cancer Brother   . Diabetes Mother   . Heart disease Father      History   Social History  . Marital Status: Married    Spouse Name: [name]    Number of Children: N/A  . Years of Education: N/A   Occupational History  . retired    Social History Main Topics  . Smoking status: Former Games developer  . Smokeless tobacco: Not on file  . Alcohol Use: No  . Drug Use: No  . Sexually Active: Not on file   Other Topics Concern  . Not on file   Social History Narrative  . No narrative on file     BP 122/60  Pulse 72  Ht 5\' 8"  (1.727 m)  Wt 86.093 kg (189 lb 12.8 oz)  BMI 28.86 kg/m2  Physical Exam:  elderly appearing NAD HEENT: Unremarkable Neck:   No JVD, no thyromegally Lungs:  Clear with no wheezes, rales, or rhonchi. Well-healed ICD incision. HEART:  Regular rate rhythm, no murmurs, no rubs, no clicks Abd:  soft, positive bowel sounds, no organomegally, no rebound, no guarding Ext:  2 plus pulses, no edema, no cyanosis, no clubbing Skin:  No rashes no nodules Neuro:  CN II through XII intact, motor grossly intact  DEVICE  Normal device function.  See PaceArt for details.   Assess/Plan:

## 2011-08-22 NOTE — Patient Instructions (Signed)
Your physician wants you to follow-up in: 12 months Dr Court Joy will receive a reminder letter in the mail two months in advance. If you don't receive a letter, please call our office to schedule the follow-up appointment.   Remote monitoring is used to monitor your Pacemaker of ICD from home. This monitoring reduces the number of office visits required to check your device to one time per year. It allows [place] to keep an eye on the functioning of your device to ensure it is working properly. You are scheduled for a device check from home on 11/23/11. You may send your transmission at any time that day. If you have a wireless device, the transmission will be sent automatically. After your physician reviews your transmission, you will receive a postcard with your next transmission date.

## 2011-08-22 NOTE — Assessment & Plan Note (Signed)
I have encouraged the patient to continue his medications and maintain a low-sodium diet.

## 2011-08-28 ENCOUNTER — Other Ambulatory Visit: Payer: Self-pay | Admitting: Internal Medicine

## 2011-09-26 ENCOUNTER — Other Ambulatory Visit: Payer: Self-pay | Admitting: Internal Medicine

## 2011-09-28 ENCOUNTER — Other Ambulatory Visit: Payer: Self-pay | Admitting: Urology

## 2011-09-28 DIAGNOSIS — C61 Malignant neoplasm of prostate: Secondary | ICD-10-CM

## 2011-10-03 ENCOUNTER — Ambulatory Visit (HOSPITAL_COMMUNITY): Payer: Medicare Other

## 2011-10-03 ENCOUNTER — Encounter (HOSPITAL_COMMUNITY): Payer: Medicare Other

## 2011-10-09 ENCOUNTER — Encounter (HOSPITAL_COMMUNITY)
Admission: RE | Admit: 2011-10-09 | Discharge: 2011-10-09 | Disposition: A | Discharge status: Home / Self Care | Payer: Medicare Other | Source: Ambulatory Visit | Attending: Urology | Admitting: Urology

## 2011-10-09 ENCOUNTER — Ambulatory Visit (HOSPITAL_COMMUNITY)
Admission: RE | Admit: 2011-10-09 | Discharge: 2011-10-09 | Disposition: A | Discharge status: Home / Self Care | Payer: Medicare Other | Source: Ambulatory Visit | Attending: Urology | Admitting: Urology

## 2011-10-09 DIAGNOSIS — C61 Malignant neoplasm of prostate: Secondary | ICD-10-CM | POA: Insufficient documentation

## 2011-10-09 MED ORDER — TECHNETIUM TC 99M MEDRONATE IV KIT
25.0000 | PACK | Freq: Once | INTRAVENOUS | Status: AC | PRN
  Administered 2011-10-09: 25 via INTRAVENOUS

## 2011-11-23 ENCOUNTER — Ambulatory Visit (INDEPENDENT_AMBULATORY_CARE_PROVIDER_SITE_OTHER): Payer: Medicare Other | Admitting: *Deleted

## 2011-11-23 ENCOUNTER — Encounter: Payer: Self-pay | Admitting: Internal Medicine

## 2011-11-23 DIAGNOSIS — I4891 Unspecified atrial fibrillation: Secondary | ICD-10-CM

## 2011-11-23 DIAGNOSIS — I472 Ventricular tachycardia: Secondary | ICD-10-CM

## 2011-11-29 LAB — REMOTE ICD DEVICE
DEVICE MODEL ICD: 149416
FVT: 0
HV IMPEDENCE: 60 Ohm
PACEART VT: 0
RV LEAD IMPEDENCE ICD: 556 Ohm
TZAT-0001FASTVT: 1
TZAT-0013FASTVT: 2
TZAT-0018FASTVT: NEGATIVE
TZON-0003FASTVT: 375 ms
TZST-0001FASTVT: 5
TZST-0001FASTVT: 6
TZST-0003FASTVT: 23 J
TZST-0003FASTVT: 41 J
TZST-0003FASTVT: 41 J

## 2011-12-01 ENCOUNTER — Telehealth: Payer: Self-pay | Admitting: Internal Medicine

## 2011-12-01 MED ORDER — ISOSORBIDE DINITRATE 10 MG PO TABS: 10.0000 mg | ORAL_TABLET | Freq: Three times a day (TID) | ORAL | Status: DC

## 2011-12-01 NOTE — Telephone Encounter (Signed)
New Problem:     [name] wife called needing a refill for her husbands isosorbide dinitrate (ISORDIL) 10 MG tablet.

## 2011-12-04 ENCOUNTER — Other Ambulatory Visit: Payer: Self-pay | Admitting: *Deleted

## 2011-12-04 NOTE — Progress Notes (Signed)
Remote icd check  

## 2011-12-22 ENCOUNTER — Encounter: Payer: Self-pay | Admitting: *Deleted

## 2012-01-08 ENCOUNTER — Telehealth: Payer: Self-pay | Admitting: Internal Medicine

## 2012-01-08 NOTE — Telephone Encounter (Signed)
Cancel test if the patient does not want to proceed.  The patient does not want to go forward with testing.

## 2012-01-08 NOTE — Telephone Encounter (Signed)
New Problem:    [name] wife called in wanting to speak with you about her husbands health.  Please call back and ask to speak to [facility].

## 2012-02-05 ENCOUNTER — Encounter: Payer: Self-pay | Admitting: Nurse Practitioner

## 2012-02-07 ENCOUNTER — Encounter: Payer: Self-pay | Admitting: Nurse Practitioner

## 2012-02-07 ENCOUNTER — Ambulatory Visit (INDEPENDENT_AMBULATORY_CARE_PROVIDER_SITE_OTHER): Payer: Medicare Other | Admitting: Nurse Practitioner

## 2012-02-07 ENCOUNTER — Ambulatory Visit: Payer: Medicare Other | Admitting: Nurse Practitioner

## 2012-02-07 VITALS — BP 92/50 | HR 58 | Ht 60.0 in | Wt 189.0 lb

## 2012-02-07 DIAGNOSIS — I498 Other specified cardiac arrhythmias: Secondary | ICD-10-CM

## 2012-02-07 DIAGNOSIS — R001 Bradycardia, unspecified: Secondary | ICD-10-CM | POA: Insufficient documentation

## 2012-02-07 MED ORDER — CARVEDILOL 3.125 MG PO TABS: 3.1250 mg | ORAL_TABLET | Freq: Two times a day (BID) | ORAL | Status: DC

## 2012-02-07 NOTE — Progress Notes (Signed)
[name] Date of Birth: 05/08/1925 Medical Record #[id number]  History of Present Illness: [name] is seen today for a work in visit. He is seen for [name]. He has an ischemic CM with chronic systolic heart failure. Has an ICD in place. Other problems are as outlined below and include atrial fib. He is on amiodarone and coumadin. Also with some memory impairment. Last echo was in 2007 and showed an EF of 20%.   He comes in today. He is here with his caregiver [name] . She provides most of the history. He has had lots of recent issues. Has an ulcer on his foot that has been slow to heal. The Home Health nurse has been seeing him. She noted that on Thursday his heart rate was low. They called the PCP who advised he go to the ER. He did not seem to be symptomatic with this. They did go to the ER at [name]. We do not have those records. He had lots of blood work and a CT scan that she thinks was of his head but she is not sure. They stopped Avelox at that time but made no other medicine changes. They were told to see their PCP on Monday. On Monday he went to see his PCP and had an EKG done. He had marked bradycardia with a rate of 44. He was in sinus. Has a chronic L BBB and 1st degree AV block. Is on amiodarone and Coreg. The Coreg was cut back to 3.125 mg BID. [name] has been monitoring his heart rate since and it has been in the mid 50's. Blood pressure is running a little higher at home. He remains asymptomatic and says he is not dizzy or lightheaded. Did get a little staggery in the shower this past Saturday night trying to put the shower sprayer back in its place. No chest pain. Weight is unchanged. Some cough but not productive.   Current Outpatient Prescriptions on File Prior to Visit  Medication Sig Dispense Refill  . ALPRAZolam (XANAX) 0.5 MG tablet Take 1 tablet (0.5 mg total) by mouth 3 (three) times daily as needed for anxiety.  90 tablet  5  . amiodarone (PACERONE) 200 MG  tablet Take 1/2 tablet by mouth once daily or as directed       . amitriptyline (ELAVIL) 25 MG tablet Take 1 tablet (25 mg total) by mouth at bedtime.  90 tablet  3  . calcitRIOL (ROCALTROL) 0.5 MCG capsule Take one tablet by mouth every other day Per [name]      . donepezil (ARICEPT) 10 MG tablet Take 10 mg by mouth at bedtime.       . fish oil-omega-3 fatty acids 1000 MG capsule Take 2 g by mouth daily.        . furosemide (LASIX) 40 MG tablet Take 2 tablets by mouth in the morning and 1 tablet in the evening      . isosorbide dinitrate (ISORDIL) 10 MG tablet Take 1 tablet (10 mg total) by mouth 3 (three) times daily.  90 tablet  2  . levothyroxine (SYNTHROID, LEVOTHROID) 100 MCG tablet Take one (1) tablet(s) once daily  90 tablet  3  . Multiple Vitamins-Minerals (MULTIVITAMIN & MINERAL PO) Take 1 tablet by mouth daily.        . nitroGLYCERIN (NITROSTAT) 0.4 MG SL tablet Place 1 tablet (0.4 mg total) under the tongue every 5 (five) minutes as needed.  30 tablet  11  .  omeprazole (PRILOSEC) 40 MG capsule Take 1 capsule (40 mg total) by mouth daily.  30 capsule  3  . sodium bicarbonate 648 MG tablet Take 1  tablets by mouth two times daily      . Tamsulosin HCl (FLOMAX) 0.4 MG CAPS Take 0.4 mg by mouth daily after supper. As directed by Dr. Earlene [name]       . warfarin (COUMADIN) 5 MG tablet take as directed by coumadin clinic  90 tablet  1  . DISCONTD: carvedilol (COREG) 6.25 MG tablet TAKE 1 TABLET TWICE A DAY WITH A MEAL  60 tablet  5    Allergies  Allergen Reactions  . Amoxicillin     REACTION: rash  . Clindamycin   . Penicillins     REACTION: rash    Past Medical History  Diagnosis Date  . Hypertension   . CAD (coronary artery disease)   . Ischemic cardiomyopathy   . Chronic systolic heart failure   . Automatic implantable cardiac defibrillator in situ   . Atrial fibrillation   . Atrial flutter   . Peripheral vascular disease   . Hypercholesteremia   . Hypothyroid   .  Colitis, ischemic   . Chronic renal insufficiency   . BPH with urinary obstruction   . History of prostate cancer   . DJD (degenerative joint disease)   . 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 Anxiety   . Anemia   . PVD (peripheral vascular disease)   . 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 High risk medication use     on amiodarone therapy    Past Surgical History  Procedure Date  . Coronary artery bypass graft 1998  . Total colectomy and ileostomy for ischemic colitis 1998  . Doppler echocardiography 2006, 2007  . Cardiac catheterization 10/26/1999    History  Smoking status  . Former Smoker  Smokeless tobacco  . Not on file    History  Alcohol Use No    Family History  Problem Relation Age of Onset  . Colon cancer Brother   . Esophageal cancer Brother   . Diabetes Mother   . Heart disease Father     Review of Systems: The review of systems is per the HPI.  All other systems were reviewed and are negative.  Physical Exam: BP 92/50  Pulse 58  Ht 5' (1.524 m)  Wt 189 lb (85.73 kg)  BMI 36.91 kg/m2 [name] very pleasant and in no acute distress. He does look chronically ill. Skin is warm and dry. Color is normal.  HEENT is unremarkable. Normocephalic/atraumatic. PERRL. Sclera are nonicteric. Neck is supple. No masses. No JVD. Lungs are fairly clear. Cardiac exam shows a regular rate and rhythm. Abdomen is obese but soft. Extremities are without significant edema. Boot in place on the right foot. Gait is unsteady but ROM appears intact. No gross neurologic deficits noted.  LABORATORY DATA: EKG today shows sinus with LBBB 1st degree AV block with a rate of 61.    Assessment / Plan:

## 2012-02-07 NOTE — Patient Instructions (Addendum)
We are going to check your device today and increase your rate back up to 60  For now, stay on your current medicines  Monitor the heart rate and blood pressure at home. Keep a diary  We will see you in a month to recheck.  Call the [facility] office at [phone] if you have any questions, problems or concerns.   Appointment with Norma Fredrickson, NP on 03/12/12 @ 2:00 pm

## 2012-02-07 NOTE — Assessment & Plan Note (Addendum)
Patient presents today with bradycardia. Has an ischemic CM with reduced EF. Has an ICD in place which I suspect is just a single chamber device. We will check his device today. His Coreg has already been cut back and I have left him on this current dose. Family will continue to monitor his heart rate at home. Will hold the Coreg for HR consistently below 50. Hopefully we will be able to continue but may have to stop altogether. May have to consider atrial lead as well but for now, we will try a more conservative approach. They will call us for any problems. Transportation is an issue and he is hard to get into the office. Will also ask Dr. Ladona Ridgel to review.  I have looked back at prior ICD checks. He actually has a DDD unit in place with a lower rate of 40 set. We will check his device today and increase his pacing rate to 60. This should take care of the bradycardia. I did not increase the Coreg back up due to his lower blood pressure here today. Family will monitor this at home and are agreeable to me seeing him back in a month for a recheck. Patient and family are agreeable to this plan and will call if any problems develop in the interim.

## 2012-02-22 ENCOUNTER — Other Ambulatory Visit: Payer: Self-pay | Admitting: Internal Medicine

## 2012-02-28 ENCOUNTER — Other Ambulatory Visit: Payer: Self-pay | Admitting: Cardiology

## 2012-02-28 ENCOUNTER — Telehealth: Payer: Self-pay | Admitting: Internal Medicine

## 2012-02-28 DIAGNOSIS — L97909 Non-pressure chronic ulcer of unspecified part of unspecified lower leg with unspecified severity: Secondary | ICD-10-CM

## 2012-02-28 NOTE — Telephone Encounter (Signed)
New problem:  Call patient home to schedule a lower arterial doppler at request of Dr. Alvan Dame from [facility]. Spoke with patient wife who stated that he was hard of hearing advising me that he did not want to schedule this appt at this time. Spoke with  Missy - PV supervisor is aware of the  situation.

## 2012-03-07 ENCOUNTER — Encounter: Payer: Self-pay | Admitting: Internal Medicine

## 2012-03-07 ENCOUNTER — Ambulatory Visit (INDEPENDENT_AMBULATORY_CARE_PROVIDER_SITE_OTHER): Payer: Medicare Other | Admitting: *Deleted

## 2012-03-07 DIAGNOSIS — I472 Ventricular tachycardia: Secondary | ICD-10-CM

## 2012-03-07 DIAGNOSIS — I4729 Other ventricular tachycardia: Secondary | ICD-10-CM

## 2012-03-11 LAB — REMOTE ICD DEVICE
DEV-0020ICD: NEGATIVE
DEVICE MODEL ICD: 149416
FVT: 0
HV IMPEDENCE: 74 Ohm
PACEART VT: 0
RV LEAD IMPEDENCE ICD: 708 Ohm
TZAT-0001FASTVT: 1
TZAT-0013FASTVT: 2
TZAT-0013FASTVT: 2
TZAT-0018FASTVT: NEGATIVE
TZON-0003FASTVT: 375 ms
TZST-0001FASTVT: 3
TZST-0001FASTVT: 4
TZST-0001FASTVT: 6
TZST-0003FASTVT: 23 J
TZST-0003FASTVT: 41 J
TZST-0003FASTVT: 41 J
TZST-0003FASTVT: 41 J
VENTRICULAR PACING ICD: 35 pct
VF: 0

## 2012-03-12 ENCOUNTER — Ambulatory Visit (INDEPENDENT_AMBULATORY_CARE_PROVIDER_SITE_OTHER): Payer: Medicare Other | Admitting: Nurse Practitioner

## 2012-03-12 ENCOUNTER — Ambulatory Visit: Payer: Medicare Other | Admitting: Nurse Practitioner

## 2012-03-12 ENCOUNTER — Encounter: Payer: Self-pay | Admitting: Nurse Practitioner

## 2012-03-12 VITALS — BP 146/58 | HR 66 | Ht 64.0 in | Wt 189.0 lb

## 2012-03-12 DIAGNOSIS — I2589 Other forms of chronic ischemic heart disease: Secondary | ICD-10-CM

## 2012-03-12 NOTE — Progress Notes (Signed)
[name] Date of Birth: [birth year] Medical Record #[id number]  History of Present Illness: [name] is seen back today for a one month check. He is seen for Dr. Ladona Ridgel. He has an ischemic CM with chronic systolic heart failure. Has an ICD in place. EF is 20%. Other problems are as outlined below. I saw him a month ago with bradycardia. He did not seem to be all that symptomatic but had been sent to the ER by his primary care. We cut his Coreg back due to some low blood pressure. We reprogrammed his pacemaker up to a rate of 60.   He comes in today. He is here with his daughter, [name]. He says he is doing well. "Still on this side of the grass". He says he feels better since we up' ed his pacemaker rate. BP has been ok at home. He is not dizzy. His wound on his right foot is healing. His family is pleased with how he is currently doing. No cardiac symptoms reported.    Current Outpatient Prescriptions on File Prior to Visit  Medication Sig Dispense Refill  . ALPRAZolam (XANAX) 0.5 MG tablet Take 1 tablet (0.5 mg total) by mouth 3 (three) times daily as needed for anxiety.  90 tablet  5  . amiodarone (PACERONE) 200 MG tablet Take 1/2 tablet by mouth once daily or as directed       . amitriptyline (ELAVIL) 25 MG tablet Take 1 tablet (25 mg total) by mouth at bedtime.  90 tablet  3  . calcitRIOL (ROCALTROL) 0.5 MCG capsule Take one tablet by mouth every other day Per [name]      . carvedilol (COREG) 3.125 MG tablet Take 1 tablet (3.125 mg total) by mouth 2 (two) times daily with a meal.  60 tablet  3  . donepezil (ARICEPT) 10 MG tablet Take 10 mg by mouth at bedtime.       . fish oil-omega-3 fatty acids 1000 MG capsule Take 2 g by mouth daily.        . furosemide (LASIX) 40 MG tablet Take 2 tablets by mouth in the morning and 1 tablet in the evening      . isosorbide dinitrate (ISORDIL) 10 MG tablet TAKE ONE TABLET BY MOUTH THREE TIMES DAILY  90 tablet  3  . levothyroxine  (SYNTHROID, LEVOTHROID) 100 MCG tablet Take one (1) tablet(s) once daily  90 tablet  3  . Multiple Vitamins-Minerals (MULTIVITAMIN & MINERAL PO) Take 1 tablet by mouth daily.        . nitroGLYCERIN (NITROSTAT) 0.4 MG SL tablet Place 1 tablet (0.4 mg total) under the tongue every 5 (five) minutes as needed.  30 tablet  11  . omeprazole (PRILOSEC) 40 MG capsule Take 1 capsule (40 mg total) by mouth daily.  30 capsule  3  . sodium bicarbonate 648 MG tablet Take 2  tablets by mouth two times daily      . Tamsulosin HCl (FLOMAX) 0.4 MG CAPS Take 0.4 mg by mouth daily after supper. As directed by Dr. Earlene Plater       . warfarin (COUMADIN) 5 MG tablet take as directed by coumadin clinic  90 tablet  1    Allergies  Allergen Reactions  . Amoxicillin     REACTION: rash  . Clindamycin   . Penicillins     REACTION: rash    Past Medical History  Diagnosis Date  . Hypertension   . CAD (coronary artery disease)  remote CABG  . Ischemic cardiomyopathy   . Chronic systolic heart failure   . Automatic implantable cardiac defibrillator in situ   . Atrial fibrillation   . Atrial flutter   . Peripheral vascular disease   . Hypercholesteremia   . Hypothyroid   . Colitis, ischemic     has ileostomy in place x 14 years  . Chronic renal insufficiency   . BPH with urinary obstruction   . History of prostate cancer   . DJD (degenerative joint disease)   . 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 Anxiety   . Anemia   . PVD (peripheral vascular disease)   . 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 High risk medication use     on amiodarone therapy    Past Surgical History  Procedure Date  . Coronary artery bypass graft 1998  . Total colectomy and ileostomy for ischemic colitis 1998  . Doppler echocardiography 2006, 2007  . Cardiac catheterization 10/26/1999    History  Smoking status  . Former Smoker  Smokeless tobacco  . Not on file    History  Alcohol Use  No    Family History  Problem Relation Age of Onset  . Colon cancer Brother   . Esophageal cancer Brother   . Diabetes Mother   . Heart disease Father     Review of Systems: The review of systems is per the HPI.  All other systems were reviewed and are negative.  Physical Exam: BP 146/58  Pulse 66  Ht 5\' 4"  (1.626 m)  Wt 189 lb (85.73 kg)  BMI 32.44 kg/m2 [name] very pleasant and in no acute distress. He does look chronically ill but looks better from my last visit. He is using a walker. Skin is warm and dry. Color is normal.  HEENT is unremarkable. Normocephalic/atraumatic. PERRL. Sclera are nonicteric. Neck is supple. No masses. No JVD. Lungs are clear. Cardiac exam shows a fairly regular rate and rhythm. Abdomen is soft. Extremities are without significant edema. He has a boot on his right foot. Gait and ROM are intact. He is using a walker. No gross neurologic deficits noted.  LABORATORY DATA: N/A  Assessment / Plan:

## 2012-03-12 NOTE — Assessment & Plan Note (Signed)
He looks fairly stable from our standpoint. He is happy with how he is doing. His heart rate is better since we reprogrammed his pacemaker. I have left him on his current medicines. We will see him back in January as planned. [name] agreeable to this plan and will call if any problems develop in the interim.

## 2012-03-12 NOTE — Patient Instructions (Signed)
Stay on current medicines  Continue to monitor the blood pressure at home  We will see you back in January with Dr. Ladona Ridgel  Call the [facility] office at [phone] if you have any questions, problems or concerns.

## 2012-03-13 ENCOUNTER — Ambulatory Visit: Payer: Medicare Other | Admitting: Nurse Practitioner

## 2012-05-30 ENCOUNTER — Other Ambulatory Visit: Payer: Self-pay | Admitting: Urology

## 2012-05-30 DIAGNOSIS — C61 Malignant neoplasm of prostate: Secondary | ICD-10-CM

## 2012-06-04 ENCOUNTER — Ambulatory Visit (HOSPITAL_COMMUNITY): Payer: Medicare Other

## 2012-06-04 ENCOUNTER — Encounter (HOSPITAL_COMMUNITY): Payer: Medicare Other

## 2012-06-06 ENCOUNTER — Encounter: Payer: Self-pay | Admitting: Internal Medicine

## 2012-06-06 ENCOUNTER — Ambulatory Visit (INDEPENDENT_AMBULATORY_CARE_PROVIDER_SITE_OTHER): Payer: Medicare Other | Admitting: *Deleted

## 2012-06-06 DIAGNOSIS — I5022 Chronic systolic (congestive) heart failure: Secondary | ICD-10-CM

## 2012-06-06 DIAGNOSIS — Z95810 Presence of automatic (implantable) cardiac defibrillator: Secondary | ICD-10-CM

## 2012-06-07 LAB — REMOTE ICD DEVICE
ATRIAL PACING ICD: 65 pct
CHARGE TIME: 9 s
DEV-0020ICD: NEGATIVE
HV IMPEDENCE: 62 Ohm
PACEART VT: 0
RV LEAD AMPLITUDE: 23 mv
TZAT-0001FASTVT: 1
TZAT-0013FASTVT: 2
TZAT-0013FASTVT: 2
TZAT-0018FASTVT: NEGATIVE
TZST-0001FASTVT: 5
TZST-0001FASTVT: 6
TZST-0003FASTVT: 41 J
TZST-0003FASTVT: 41 J
TZST-0003FASTVT: 41 J
VENTRICULAR PACING ICD: 41 pct
VF: 0

## 2012-06-22 ENCOUNTER — Other Ambulatory Visit: Payer: Self-pay | Admitting: Internal Medicine

## 2012-06-24 ENCOUNTER — Other Ambulatory Visit: Payer: Self-pay | Admitting: Cardiology

## 2012-06-24 MED ORDER — ISOSORBIDE DINITRATE 10 MG PO TABS: 10.0000 mg | ORAL_TABLET | Freq: Three times a day (TID) | ORAL | Status: DC

## 2012-06-26 ENCOUNTER — Encounter: Payer: Self-pay | Admitting: *Deleted

## 2012-07-20 ENCOUNTER — Other Ambulatory Visit: Payer: Self-pay | Admitting: Internal Medicine

## 2012-07-26 ENCOUNTER — Other Ambulatory Visit: Payer: Self-pay | Admitting: *Deleted

## 2012-08-20 ENCOUNTER — Other Ambulatory Visit: Payer: Self-pay | Admitting: Internal Medicine

## 2012-08-20 ENCOUNTER — Encounter: Payer: Self-pay | Admitting: Internal Medicine

## 2012-08-20 ENCOUNTER — Ambulatory Visit (INDEPENDENT_AMBULATORY_CARE_PROVIDER_SITE_OTHER): Payer: Medicare PPO | Admitting: Internal Medicine

## 2012-08-20 VITALS — BP 137/56 | HR 59 | Ht 60.0 in | Wt 188.8 lb

## 2012-08-20 DIAGNOSIS — I4891 Unspecified atrial fibrillation: Secondary | ICD-10-CM

## 2012-08-20 DIAGNOSIS — Z95810 Presence of automatic (implantable) cardiac defibrillator: Secondary | ICD-10-CM

## 2012-08-20 DIAGNOSIS — I5022 Chronic systolic (congestive) heart failure: Secondary | ICD-10-CM

## 2012-08-20 DIAGNOSIS — R001 Bradycardia, unspecified: Secondary | ICD-10-CM

## 2012-08-20 DIAGNOSIS — I498 Other specified cardiac arrhythmias: Secondary | ICD-10-CM

## 2012-08-20 DIAGNOSIS — I2589 Other forms of chronic ischemic heart disease: Secondary | ICD-10-CM

## 2012-08-20 LAB — ICD DEVICE OBSERVATION
AL IMPEDENCE ICD: 654 Ohm
AL THRESHOLD: 0.8 V
ATRIAL PACING ICD: 66 pct
DEVICE MODEL ICD: 149416
HV IMPEDENCE: 65 Ohm
RV LEAD AMPLITUDE: 25 mv
TZAT-0001FASTVT: 1
TZAT-0013FASTVT: 2
TZAT-0018FASTVT: NEGATIVE
TZST-0001FASTVT: 4
TZST-0001FASTVT: 5
TZST-0001FASTVT: 6
TZST-0001FASTVT: 7
TZST-0003FASTVT: 23 J
TZST-0003FASTVT: 41 J
TZST-0003FASTVT: 41 J
TZST-0003FASTVT: 41 J
VENTRICULAR PACING ICD: 44 pct

## 2012-08-20 NOTE — Assessment & Plan Note (Signed)
He appears to be maintaining sinus rhythm on low-dose amiodarone therapy. We will follow.

## 2012-08-20 NOTE — Progress Notes (Signed)
HPI [name] returns today for followup. He is a very pleasant 79 year old man with an ischemic cardiomyopathy, chronic systolic heart failure, status post ICD implantation. In the interim, he denies chest pain, shortness of breath, or peripheral edema. No syncope. With his advanced age, he is not quite as active as he used to be. Allergies  Allergen Reactions  . Amoxicillin     REACTION: rash  . Clindamycin   . Penicillins     REACTION: rash     Current Outpatient Prescriptions  Medication Sig Dispense Refill  . amiodarone (PACERONE) 200 MG tablet Take 1/2 tablet by mouth once daily or as directed       . amitriptyline (ELAVIL) 25 MG tablet Take 1 tablet (25 mg total) by mouth at bedtime.  90 tablet  3  . calcitRIOL (ROCALTROL) 0.5 MCG capsule Take one tablet by mouth every other day Per [name]      . carvedilol (COREG) 3.125 MG tablet Take 1 tablet (3.125 mg total) by mouth 2 (two) times daily with a meal.  60 tablet  3  . donepezil (ARICEPT) 10 MG tablet Take 10 mg by mouth at bedtime.       . fish oil-omega-3 fatty acids 1000 MG capsule Take 2 g by mouth daily.        . furosemide (LASIX) 40 MG tablet Take 2 tablets by mouth in the morning and 1 tablet in the evening      . isosorbide dinitrate (ISORDIL) 10 MG tablet TAKE ONE TABLET BY MOUTH THREE TIMES DAILY  90 tablet  0  . levothyroxine (SYNTHROID, LEVOTHROID) 100 MCG tablet Take one (1) tablet(s) once daily  90 tablet  3  . Multiple Vitamins-Minerals (MULTIVITAMIN & MINERAL PO) Take 1 tablet by mouth daily.        . nitroGLYCERIN (NITROSTAT) 0.4 MG SL tablet Place 1 tablet (0.4 mg total) under the tongue every 5 (five) minutes as needed.  30 tablet  11  . omeprazole (PRILOSEC) 40 MG capsule Take 1 capsule (40 mg total) by mouth daily.  30 capsule  3  . sodium bicarbonate 648 MG tablet Take 2  tablets by mouth two times daily      . Tamsulosin HCl (FLOMAX) 0.4 MG CAPS Take 0.4 mg by mouth daily after supper. As directed by  Dr. Earlene Plater       . warfarin (COUMADIN) 5 MG tablet take as directed by coumadin clinic  90 tablet  1     Past Medical History  Diagnosis Date  . Hypertension   . CAD (coronary artery disease)     remote CABG  . Ischemic cardiomyopathy   . Chronic systolic heart failure   . Automatic implantable cardiac defibrillator in situ   . Atrial fibrillation   . Atrial flutter   . Peripheral vascular disease   . Hypercholesteremia   . Hypothyroid   . Colitis, ischemic     has ileostomy in place x 14 years  . Chronic renal insufficiency   . BPH with urinary obstruction   . History of prostate cancer   . DJD (degenerative joint disease)   . 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 Anxiety   . Anemia   . PVD (peripheral vascular disease)   . 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 High risk medication use     on amiodarone therapy    ROS:   All systems reviewed and negative except as  noted in the HPI.   Past Surgical History  Procedure Date  . Coronary artery bypass graft 1998  . Total colectomy and ileostomy for ischemic colitis 1998  . Doppler echocardiography 2006, 2007  . Cardiac catheterization 10/26/1999     Family History  Problem Relation Age of Onset  . Colon cancer Brother   . Esophageal cancer Brother   . Diabetes Mother   . Heart disease Father      History   Social History  . Marital Status: Married    Spouse Name: peggy Reeser    Number of Children: N/A  . Years of Education: N/A   Occupational History  . retired    Social History Main Topics  . Smoking status: Former Games developer  . Smokeless tobacco: Not on file  . Alcohol Use: No  . Drug Use: No  . Sexually Active: Not Currently   Other Topics Concern  . Not on file   Social History Narrative  . No narrative on file     BP 137/56  Pulse 59  Ht 5' (1.524 m)  Wt 188 lb 12.8 oz (85.639 kg)  BMI 36.87 kg/m2  Physical Exam:  Stable appearing elderly man,  NAD HEENT: Unremarkable Neck:  7 cm JVD, no thyromegally Lungs:  Clear except for rare basilar rales. No wheezes or rhonchi. HEART:  Regular rate rhythm, no murmurs, no rubs, no clicks Abd:  soft, positive bowel sounds, no organomegally, no rebound, no guarding Ext:  2 plus pulses, no edema, no cyanosis, no clubbing Skin:  No rashes no nodules Neuro:  CN II through XII intact, motor grossly intact  DEVICE  Normal device function.  See PaceArt for details.   Assess/Plan:

## 2012-08-20 NOTE — Assessment & Plan Note (Signed)
His defibrillator is working normally today. He has had no intermittent ICD therapies. We'll plan to recheck in several months.

## 2012-08-20 NOTE — Patient Instructions (Signed)
Your physician wants you to follow-up in: 6 months with Norma Fredrickson, NP.  You will receive a reminder letter in the mail two months in advance. If you don't receive a letter, please call our office to schedule the follow-up appointment.  Your physician wants you to follow-up in: 1 year with Dr. Ladona Ridgel.  You will receive a reminder letter in the mail two months in advance. If you don't receive a letter, please call our office to schedule the follow-up appointment.

## 2012-08-20 NOTE — Assessment & Plan Note (Signed)
Despite his prior cardiac history, he denies anginal symptoms. He will continue his current medical therapy. I've encouraged the patient to increase his physical activity.

## 2012-09-05 ENCOUNTER — Encounter: Payer: Self-pay | Admitting: Internal Medicine

## 2012-09-09 ENCOUNTER — Telehealth: Payer: Self-pay | Admitting: Internal Medicine

## 2012-09-09 MED ORDER — FUROSEMIDE 40 MG PO TABS: ORAL_TABLET | ORAL | Status: DC

## 2012-09-09 NOTE — Telephone Encounter (Signed)
Spoke with wife  He is only taking Furosemide 2 tablets in the am 80mg  total  None in the pm  I will change his medication list

## 2012-09-09 NOTE — Telephone Encounter (Signed)
New problem    Wife calling   clarification on medication list . Wife states he does take 2 tablets - 10 mg of lasix in am. Nothing  in the pm.

## 2012-09-17 ENCOUNTER — Encounter: Payer: Self-pay | Admitting: Internal Medicine

## 2012-10-04 ENCOUNTER — Other Ambulatory Visit: Payer: Self-pay | Admitting: Urology

## 2012-10-14 ENCOUNTER — Encounter: Payer: Self-pay | Admitting: Internal Medicine

## 2012-11-21 ENCOUNTER — Ambulatory Visit (INDEPENDENT_AMBULATORY_CARE_PROVIDER_SITE_OTHER): Payer: Medicare PPO | Admitting: *Deleted

## 2012-11-21 DIAGNOSIS — Z95810 Presence of automatic (implantable) cardiac defibrillator: Secondary | ICD-10-CM

## 2012-11-21 DIAGNOSIS — I2589 Other forms of chronic ischemic heart disease: Secondary | ICD-10-CM

## 2012-11-27 ENCOUNTER — Encounter (HOSPITAL_COMMUNITY): Payer: Medicare PPO

## 2012-12-12 ENCOUNTER — Other Ambulatory Visit: Payer: Self-pay | Admitting: Internal Medicine

## 2012-12-30 LAB — REMOTE ICD DEVICE
AL AMPLITUDE: 3.3 mv
ATRIAL PACING ICD: 72 pct
CHARGE TIME: 9.1 s
DEV-0020ICD: NEGATIVE
PACEART VT: 0
RV LEAD AMPLITUDE: 24.2 mv
TOT-0006: 20140114000000
TZAT-0001FASTVT: 1
TZAT-0013FASTVT: 2
TZAT-0018FASTVT: NEGATIVE
TZAT-0018FASTVT: NEGATIVE
TZST-0001FASTVT: 3
TZST-0001FASTVT: 4
TZST-0001FASTVT: 5
TZST-0001FASTVT: 8
TZST-0003FASTVT: 23 J
TZST-0003FASTVT: 41 J
TZST-0003FASTVT: 41 J
VENTRICULAR PACING ICD: 12 pct

## 2013-01-09 ENCOUNTER — Encounter: Payer: Self-pay | Admitting: Internal Medicine

## 2013-01-24 ENCOUNTER — Other Ambulatory Visit: Payer: Self-pay | Admitting: Nurse Practitioner

## 2013-01-29 ENCOUNTER — Encounter (HOSPITAL_COMMUNITY): Payer: Self-pay

## 2013-01-29 ENCOUNTER — Encounter (HOSPITAL_COMMUNITY)
Admission: RE | Admit: 2013-01-29 | Discharge: 2013-01-29 | Disposition: A | Discharge status: Home / Self Care | Payer: Medicare PPO | Source: Ambulatory Visit | Attending: Urology | Admitting: Urology

## 2013-01-29 DIAGNOSIS — C61 Malignant neoplasm of prostate: Secondary | ICD-10-CM | POA: Insufficient documentation

## 2013-01-29 MED ORDER — TECHNETIUM TC 99M MEDRONATE IV KIT
26.0000 | PACK | Freq: Once | INTRAVENOUS | Status: AC | PRN
  Administered 2013-01-29: 26 via INTRAVENOUS

## 2013-02-18 ENCOUNTER — Other Ambulatory Visit: Payer: Self-pay | Admitting: Internal Medicine

## 2013-03-13 ENCOUNTER — Encounter: Payer: Medicare PPO | Admitting: *Deleted

## 2013-03-18 ENCOUNTER — Telehealth: Payer: Self-pay | Admitting: Internal Medicine

## 2013-03-18 NOTE — Telephone Encounter (Signed)
New problem  pts daughter wants to check dosage on pt med

## 2013-03-18 NOTE — Telephone Encounter (Signed)
Pt's granddaughter [place] is calling to confirm dose of amiodarone is 1/2 of a 200mg  tablet daily. Pt's wife has been giving patient 1/4 of a 200mg  tablet daily. Pt's wife is sick and not going to be able to give pt his meds anymore. Pt's daughter,Tonya or granddaughter, Lowanda Foster are going to be giving pt his medication. I will forward to Dr Ladona Ridgel to confirm amiodarone dose should be 1/2 of a 200mg  tablet daily.

## 2013-03-19 NOTE — Telephone Encounter (Signed)
They have gotten a MD closer to home and he has been following with them per patient's wife.  I spent trying to explain that 200mg  1/2 tablet daily of the Amiodarone is correct.  I asked her to have the granddaughter retrun my call for further questions

## 2013-04-03 ENCOUNTER — Ambulatory Visit (INDEPENDENT_AMBULATORY_CARE_PROVIDER_SITE_OTHER): Payer: Medicare PPO | Admitting: *Deleted

## 2013-04-03 DIAGNOSIS — Z95810 Presence of automatic (implantable) cardiac defibrillator: Secondary | ICD-10-CM

## 2013-04-03 DIAGNOSIS — I2589 Other forms of chronic ischemic heart disease: Secondary | ICD-10-CM

## 2013-04-04 LAB — REMOTE ICD DEVICE
AL AMPLITUDE: 2.9 mv
ATRIAL PACING ICD: 70 pct
DEV-0020ICD: NEGATIVE
DEVICE MODEL ICD: 149416
HV IMPEDENCE: 65 Ohm
RV LEAD AMPLITUDE: 23.6 mv
RV LEAD IMPEDENCE ICD: 692 Ohm
TZAT-0001FASTVT: 2
TZAT-0013FASTVT: 2
TZAT-0018FASTVT: NEGATIVE
TZAT-0018FASTVT: NEGATIVE
TZST-0001FASTVT: 4
TZST-0001FASTVT: 5
TZST-0001FASTVT: 8
TZST-0003FASTVT: 23 J
TZST-0003FASTVT: 41 J
TZST-0003FASTVT: 41 J
TZST-0003FASTVT: 41 J
VENTRICULAR PACING ICD: 12 pct

## 2013-04-16 ENCOUNTER — Encounter: Payer: Self-pay | Admitting: Internal Medicine

## 2013-04-16 LAB — PROTIME-INR

## 2013-04-30 ENCOUNTER — Encounter: Payer: Self-pay | Admitting: *Deleted

## 2013-05-05 ENCOUNTER — Other Ambulatory Visit: Payer: Self-pay

## 2013-05-05 MED ORDER — CARVEDILOL 3.125 MG PO TABS: ORAL_TABLET | ORAL | Status: DC

## 2013-05-08 ENCOUNTER — Encounter: Payer: Self-pay | Admitting: Internal Medicine

## 2013-05-22 ENCOUNTER — Other Ambulatory Visit: Payer: Self-pay | Admitting: *Deleted

## 2013-05-22 DIAGNOSIS — N184 Chronic kidney disease, stage 4 (severe): Secondary | ICD-10-CM

## 2013-05-22 DIAGNOSIS — Z01810 Encounter for preprocedural cardiovascular examination: Secondary | ICD-10-CM

## 2013-06-16 ENCOUNTER — Ambulatory Visit: Payer: Medicare PPO | Admitting: Surgery

## 2013-06-16 ENCOUNTER — Other Ambulatory Visit (HOSPITAL_COMMUNITY): Payer: Medicare PPO

## 2013-06-16 ENCOUNTER — Encounter (HOSPITAL_COMMUNITY): Payer: Medicare PPO

## 2013-07-04 ENCOUNTER — Ambulatory Visit (INDEPENDENT_AMBULATORY_CARE_PROVIDER_SITE_OTHER): Payer: Medicare PPO | Admitting: *Deleted

## 2013-07-04 DIAGNOSIS — I4891 Unspecified atrial fibrillation: Secondary | ICD-10-CM

## 2013-07-04 DIAGNOSIS — R001 Bradycardia, unspecified: Secondary | ICD-10-CM

## 2013-07-04 DIAGNOSIS — I498 Other specified cardiac arrhythmias: Secondary | ICD-10-CM

## 2013-07-04 LAB — MDC_IDC_ENUM_SESS_TYPE_REMOTE
Brady Statistic RA Percent Paced: 68 %
Brady Statistic RV Percent Paced: 11 %
Lead Channel Sensing Intrinsic Amplitude: 23 mV
Lead Channel Setting Pacing Amplitude: 2 V
Lead Channel Setting Pacing Amplitude: 2.4 V
Lead Channel Setting Pacing Pulse Width: 0.4 ms
Zone Setting Detection Interval: 375 ms

## 2013-07-10 ENCOUNTER — Other Ambulatory Visit: Payer: Self-pay

## 2013-07-10 MED ORDER — CARVEDILOL 3.125 MG PO TABS: ORAL_TABLET | ORAL | Status: DC

## 2013-07-22 ENCOUNTER — Encounter: Payer: Self-pay | Admitting: *Deleted

## 2013-07-30 ENCOUNTER — Encounter: Payer: Self-pay | Admitting: Internal Medicine

## 2013-08-14 ENCOUNTER — Telehealth: Payer: Self-pay | Admitting: Internal Medicine

## 2013-08-14 ENCOUNTER — Encounter: Payer: Self-pay | Admitting: Internal Medicine

## 2013-08-14 NOTE — Telephone Encounter (Signed)
New message     Wife says husbands defibulator went off yesterday afternoon.  He is now in [place] at another doctor's appt but the wife wanted [place] to know.  I called device clinic and overhead paged them with no answer.

## 2013-08-14 NOTE — Telephone Encounter (Signed)
Spoke with wife and she confirmed the device discharge 08/13/13.  The patient denies any dizziness, or palpitations.  Appointment scheduled with Dr. Lovena Le for 1/27 with instructions given to call EMS if he is shocked again before his appointment.

## 2013-08-15 ENCOUNTER — Emergency Department (HOSPITAL_COMMUNITY): Payer: Medicare Other

## 2013-08-15 ENCOUNTER — Inpatient Hospital Stay (HOSPITAL_COMMUNITY)
Admission: EM | Admit: 2013-08-15 | Discharge: 2013-08-19 | DRG: 308 | Disposition: A | Discharge status: Home / Self Care | Payer: Medicare Other | Attending: Internal Medicine | Admitting: Internal Medicine

## 2013-08-15 ENCOUNTER — Encounter (HOSPITAL_COMMUNITY): Payer: Self-pay | Admitting: Emergency Medicine

## 2013-08-15 DIAGNOSIS — E876 Hypokalemia: Secondary | ICD-10-CM | POA: Diagnosis present

## 2013-08-15 DIAGNOSIS — I10 Essential (primary) hypertension: Secondary | ICD-10-CM | POA: Diagnosis present

## 2013-08-15 DIAGNOSIS — Z515 Encounter for palliative care: Secondary | ICD-10-CM

## 2013-08-15 DIAGNOSIS — Z95810 Presence of automatic (implantable) cardiac defibrillator: Secondary | ICD-10-CM

## 2013-08-15 DIAGNOSIS — Z87891 Personal history of nicotine dependence: Secondary | ICD-10-CM

## 2013-08-15 DIAGNOSIS — Z4502 Encounter for adjustment and management of automatic implantable cardiac defibrillator: Secondary | ICD-10-CM

## 2013-08-15 DIAGNOSIS — N179 Acute kidney failure, unspecified: Secondary | ICD-10-CM | POA: Diagnosis present

## 2013-08-15 DIAGNOSIS — E785 Hyperlipidemia, unspecified: Secondary | ICD-10-CM | POA: Diagnosis present

## 2013-08-15 DIAGNOSIS — L97509 Non-pressure chronic ulcer of other part of unspecified foot with unspecified severity: Secondary | ICD-10-CM | POA: Diagnosis present

## 2013-08-15 DIAGNOSIS — R633 Feeding difficulties, unspecified: Secondary | ICD-10-CM | POA: Diagnosis present

## 2013-08-15 DIAGNOSIS — Z923 Personal history of irradiation: Secondary | ICD-10-CM

## 2013-08-15 DIAGNOSIS — I4729 Other ventricular tachycardia: Secondary | ICD-10-CM | POA: Diagnosis present

## 2013-08-15 DIAGNOSIS — I739 Peripheral vascular disease, unspecified: Secondary | ICD-10-CM | POA: Diagnosis present

## 2013-08-15 DIAGNOSIS — I4891 Unspecified atrial fibrillation: Secondary | ICD-10-CM | POA: Diagnosis present

## 2013-08-15 DIAGNOSIS — Z8546 Personal history of malignant neoplasm of prostate: Secondary | ICD-10-CM

## 2013-08-15 DIAGNOSIS — I509 Heart failure, unspecified: Secondary | ICD-10-CM | POA: Diagnosis present

## 2013-08-15 DIAGNOSIS — I498 Other specified cardiac arrhythmias: Secondary | ICD-10-CM | POA: Diagnosis present

## 2013-08-15 DIAGNOSIS — I129 Hypertensive chronic kidney disease with stage 1 through stage 4 chronic kidney disease, or unspecified chronic kidney disease: Secondary | ICD-10-CM | POA: Diagnosis present

## 2013-08-15 DIAGNOSIS — I472 Ventricular tachycardia, unspecified: Secondary | ICD-10-CM | POA: Diagnosis present

## 2013-08-15 DIAGNOSIS — E7800 Pure hypercholesterolemia, unspecified: Secondary | ICD-10-CM | POA: Diagnosis present

## 2013-08-15 DIAGNOSIS — N289 Disorder of kidney and ureter, unspecified: Secondary | ICD-10-CM

## 2013-08-15 DIAGNOSIS — Z880 Allergy status to penicillin: Secondary | ICD-10-CM

## 2013-08-15 DIAGNOSIS — N184 Chronic kidney disease, stage 4 (severe): Secondary | ICD-10-CM | POA: Diagnosis present

## 2013-08-15 DIAGNOSIS — Z951 Presence of aortocoronary bypass graft: Secondary | ICD-10-CM

## 2013-08-15 DIAGNOSIS — R509 Fever, unspecified: Secondary | ICD-10-CM | POA: Diagnosis present

## 2013-08-15 DIAGNOSIS — C61 Malignant neoplasm of prostate: Secondary | ICD-10-CM | POA: Diagnosis present

## 2013-08-15 DIAGNOSIS — I2589 Other forms of chronic ischemic heart disease: Secondary | ICD-10-CM | POA: Diagnosis present

## 2013-08-15 DIAGNOSIS — L89609 Pressure ulcer of unspecified heel, unspecified stage: Secondary | ICD-10-CM | POA: Diagnosis present

## 2013-08-15 DIAGNOSIS — N189 Chronic kidney disease, unspecified: Secondary | ICD-10-CM

## 2013-08-15 DIAGNOSIS — Z881 Allergy status to other antibiotic agents status: Secondary | ICD-10-CM

## 2013-08-15 DIAGNOSIS — M1460 Charcot's joint, unspecified site: Secondary | ICD-10-CM | POA: Diagnosis present

## 2013-08-15 DIAGNOSIS — I2510 Atherosclerotic heart disease of native coronary artery without angina pectoris: Secondary | ICD-10-CM | POA: Diagnosis present

## 2013-08-15 DIAGNOSIS — G988 Other disorders of nervous system: Secondary | ICD-10-CM | POA: Diagnosis present

## 2013-08-15 DIAGNOSIS — E039 Hypothyroidism, unspecified: Secondary | ICD-10-CM | POA: Diagnosis present

## 2013-08-15 DIAGNOSIS — Z833 Family history of diabetes mellitus: Secondary | ICD-10-CM

## 2013-08-15 DIAGNOSIS — F411 Generalized anxiety disorder: Secondary | ICD-10-CM | POA: Diagnosis present

## 2013-08-15 DIAGNOSIS — Z7901 Long term (current) use of anticoagulants: Secondary | ICD-10-CM

## 2013-08-15 DIAGNOSIS — T82190A Other mechanical complication of cardiac electrode, initial encounter: Principal | ICD-10-CM | POA: Diagnosis present

## 2013-08-15 DIAGNOSIS — Z932 Ileostomy status: Secondary | ICD-10-CM

## 2013-08-15 DIAGNOSIS — Z800 Family history of malignant neoplasm of digestive organs: Secondary | ICD-10-CM

## 2013-08-15 DIAGNOSIS — Z8249 Family history of ischemic heart disease and other diseases of the circulatory system: Secondary | ICD-10-CM

## 2013-08-15 DIAGNOSIS — N170 Acute kidney failure with tubular necrosis: Secondary | ICD-10-CM | POA: Diagnosis present

## 2013-08-15 DIAGNOSIS — I5022 Chronic systolic (congestive) heart failure: Secondary | ICD-10-CM | POA: Diagnosis present

## 2013-08-15 DIAGNOSIS — I1310 Hypertensive heart and chronic kidney disease without heart failure, with stage 1 through stage 4 chronic kidney disease, or unspecified chronic kidney disease: Secondary | ICD-10-CM | POA: Clinically undetermined

## 2013-08-15 DIAGNOSIS — K559 Vascular disorder of intestine, unspecified: Secondary | ICD-10-CM | POA: Diagnosis present

## 2013-08-15 DIAGNOSIS — L8992 Pressure ulcer of unspecified site, stage 2: Secondary | ICD-10-CM | POA: Diagnosis present

## 2013-08-15 HISTORY — DX: Heart failure, unspecified: I50.9

## 2013-08-15 HISTORY — DX: Chronic kidney disease, stage 4 (severe): N18.4

## 2013-08-15 HISTORY — DX: Acute myocardial infarction, unspecified: I21.9

## 2013-08-15 HISTORY — DX: Unspecified malignant neoplasm of skin, unspecified: C44.90

## 2013-08-15 HISTORY — DX: Malignant neoplasm of prostate: C61

## 2013-08-15 HISTORY — DX: Presence of automatic (implantable) cardiac defibrillator: Z95.810

## 2013-08-15 HISTORY — DX: Gastro-esophageal reflux disease without esophagitis: K21.9

## 2013-08-15 LAB — BASIC METABOLIC PANEL
BUN: 65 mg/dL — AB (ref 6–23)
BUN: 69 mg/dL — ABNORMAL HIGH (ref 6–23)
CHLORIDE: 96 meq/L (ref 96–112)
CHLORIDE: 95 meq/L — AB (ref 96–112)
CO2: 22 mEq/L (ref 19–32)
CO2: 22 meq/L (ref 19–32)
CREATININE: 3.96 mg/dL — AB (ref 0.50–1.35)
Calcium: 9.7 mg/dL (ref 8.4–10.5)
Calcium: 9 mg/dL (ref 8.4–10.5)
Creatinine, Ser: 4.09 mg/dL — ABNORMAL HIGH (ref 0.50–1.35)
GFR calc Af Amer: 14 mL/min — ABNORMAL LOW (ref >90)
GFR calc Af Amer: 14 mL/min — ABNORMAL LOW (ref >90)
GFR calc non Af Amer: 12 mL/min — ABNORMAL LOW (ref >90)
GFR calc non Af Amer: 12 mL/min — ABNORMAL LOW (ref >90)
Glucose, Bld: 121 mg/dL — ABNORMAL HIGH (ref 70–99)
Glucose, Bld: 113 mg/dL — ABNORMAL HIGH (ref 70–99)
POTASSIUM: 3.5 meq/L — AB (ref 3.7–5.3)
Potassium: 4.3 mEq/L (ref 3.7–5.3)
SODIUM: 133 meq/L — AB (ref 137–147)
Sodium: 136 mEq/L — ABNORMAL LOW (ref 137–147)

## 2013-08-15 LAB — URINALYSIS, ROUTINE W REFLEX MICROSCOPIC
Bilirubin Urine: NEGATIVE
Glucose, UA: NEGATIVE mg/dL
Hgb urine dipstick: NEGATIVE
Ketones, ur: NEGATIVE mg/dL
Leukocytes, UA: NEGATIVE
NITRITE: NEGATIVE
PH: 5 (ref 5.0–8.0)
Protein, ur: NEGATIVE mg/dL
SPECIFIC GRAVITY, URINE: 1.014 (ref 1.005–1.030)
Urobilinogen, UA: 0.2 mg/dL (ref 0.0–1.0)

## 2013-08-15 LAB — CBC WITH DIFFERENTIAL/PLATELET
BASOS PCT: 0 % (ref 0–1)
Basophils Absolute: 0 10*3/uL (ref 0.0–0.1)
Eosinophils Absolute: 0 10*3/uL (ref 0.0–0.7)
Eosinophils Relative: 0 % (ref 0–5)
HCT: 38.4 % — ABNORMAL LOW (ref 39.0–52.0)
Hemoglobin: 13 g/dL (ref 13.0–17.0)
LYMPHS PCT: 5 % — AB (ref 12–46)
Lymphs Abs: 1.2 10*3/uL (ref 0.7–4.0)
MCH: 32.4 pg (ref 26.0–34.0)
MCHC: 33.9 g/dL (ref 30.0–36.0)
MCV: 95.8 fL (ref 78.0–100.0)
MONOS PCT: 6 % (ref 3–12)
Monocytes Absolute: 1.3 10*3/uL — ABNORMAL HIGH (ref 0.1–1.0)
NEUTROS ABS: 21.8 10*3/uL — AB (ref 1.7–7.7)
NEUTROS PCT: 90 % — AB (ref 43–77)
Platelets: 173 10*3/uL (ref 150–400)
RBC: 4.01 MIL/uL — ABNORMAL LOW (ref 4.22–5.81)
RDW: 14.3 % (ref 11.5–15.5)
WBC: 24.3 10*3/uL — AB (ref 4.0–10.5)

## 2013-08-15 LAB — TROPONIN I: TROPONIN I: 0.4 ng/mL — AB (ref <0.30)

## 2013-08-15 LAB — MAGNESIUM: Magnesium: 1.7 mg/dL (ref 1.5–2.5)

## 2013-08-15 LAB — PROTIME-INR
INR: 2.98 — AB (ref 0.00–1.49)
Prothrombin Time: 29.9 seconds — ABNORMAL HIGH (ref 11.6–15.2)

## 2013-08-15 LAB — INFLUENZA PANEL BY PCR (TYPE A & B)
H1N1 flu by pcr: NOT DETECTED
Influenza A By PCR: NEGATIVE
Influenza B By PCR: NEGATIVE

## 2013-08-15 LAB — GLUCOSE, CAPILLARY: Glucose-Capillary: 109 mg/dL — ABNORMAL HIGH (ref 70–99)

## 2013-08-15 LAB — PRO B NATRIURETIC PEPTIDE: Pro B Natriuretic peptide (BNP): 31605 pg/mL — ABNORMAL HIGH (ref 0–450)

## 2013-08-15 LAB — TSH: TSH: 0.295 u[IU]/mL — ABNORMAL LOW (ref 0.350–4.500)

## 2013-08-15 MED ORDER — FUROSEMIDE 80 MG PO TABS
80.0000 mg | ORAL_TABLET | Freq: Every day | ORAL | Status: DC
  Administered 2013-08-15: 19:00:00 80 mg via ORAL
  Filled 2013-08-15 (×2): qty 1

## 2013-08-15 MED ORDER — CARVEDILOL 3.125 MG PO TABS
3.1250 mg | ORAL_TABLET | Freq: Two times a day (BID) | ORAL | Status: DC
  Administered 2013-08-15 – 2013-08-19 (×9): 3.125 mg via ORAL
  Filled 2013-08-15 (×10): qty 1

## 2013-08-15 MED ORDER — OMEGA-3 FATTY ACIDS 1000 MG PO CAPS: 2.0000 g | ORAL_CAPSULE | Freq: Every day | ORAL | Status: DC

## 2013-08-15 MED ORDER — POTASSIUM CHLORIDE CRYS ER 20 MEQ PO TBCR
40.0000 meq | EXTENDED_RELEASE_TABLET | Freq: Once | ORAL | Status: AC
  Administered 2013-08-15: 40 meq via ORAL
  Filled 2013-08-15: qty 2

## 2013-08-15 MED ORDER — AMIODARONE HCL IN DEXTROSE 360-4.14 MG/200ML-% IV SOLN
30.0000 mg/h | INTRAVENOUS | Status: DC
  Administered 2013-08-15 – 2013-08-16 (×2): 30 mg/h via INTRAVENOUS
  Filled 2013-08-15 (×8): qty 200

## 2013-08-15 MED ORDER — SODIUM CHLORIDE 0.9 % IJ SOLN: 3.0000 mL | INTRAMUSCULAR | Status: DC | PRN

## 2013-08-15 MED ORDER — HEPARIN SODIUM (PORCINE) 5000 UNIT/ML IJ SOLN: 5000.0000 [IU] | Freq: Three times a day (TID) | INTRAMUSCULAR | Status: DC

## 2013-08-15 MED ORDER — WARFARIN SODIUM 2.5 MG PO TABS
2.5000 mg | ORAL_TABLET | Freq: Once | ORAL | Status: AC
Start: 2013-08-15 — End: 2013-08-15
  Administered 2013-08-15: 22:00:00 2.5 mg via ORAL
  Filled 2013-08-15 (×2): qty 1

## 2013-08-15 MED ORDER — DONEPEZIL HCL 10 MG PO TABS
10.0000 mg | ORAL_TABLET | Freq: Every day | ORAL | Status: DC
  Administered 2013-08-15 – 2013-08-18 (×4): 10 mg via ORAL
  Filled 2013-08-15 (×6): qty 1

## 2013-08-15 MED ORDER — AMITRIPTYLINE HCL 25 MG PO TABS
25.0000 mg | ORAL_TABLET | Freq: Every day | ORAL | Status: DC
  Administered 2013-08-15 – 2013-08-18 (×4): 25 mg via ORAL
  Filled 2013-08-15 (×6): qty 1

## 2013-08-15 MED ORDER — CALCITRIOL 0.5 MCG PO CAPS
0.5000 ug | ORAL_CAPSULE | Freq: Every day | ORAL | Status: DC
  Administered 2013-08-15 – 2013-08-19 (×5): 0.5 ug via ORAL
  Filled 2013-08-15 (×5): qty 1

## 2013-08-15 MED ORDER — SODIUM BICARBONATE 650 MG PO TABS
650.0000 mg | ORAL_TABLET | Freq: Two times a day (BID) | ORAL | Status: DC
  Administered 2013-08-15 – 2013-08-19 (×8): 650 mg via ORAL
  Filled 2013-08-15 (×9): qty 1

## 2013-08-15 MED ORDER — SODIUM CHLORIDE 0.9 % IJ SOLN
3.0000 mL | Freq: Two times a day (BID) | INTRAMUSCULAR | Status: DC
  Administered 2013-08-16 – 2013-08-18 (×4): 3 mL via INTRAVENOUS

## 2013-08-15 MED ORDER — PANTOPRAZOLE SODIUM 40 MG PO TBEC
40.0000 mg | DELAYED_RELEASE_TABLET | Freq: Every day | ORAL | Status: DC
  Administered 2013-08-15 – 2013-08-19 (×5): 40 mg via ORAL
  Filled 2013-08-15 (×4): qty 1

## 2013-08-15 MED ORDER — ISOSORBIDE DINITRATE 10 MG PO TABS
10.0000 mg | ORAL_TABLET | Freq: Three times a day (TID) | ORAL | Status: DC
  Administered 2013-08-15 – 2013-08-19 (×13): 10 mg via ORAL
  Filled 2013-08-15 (×15): qty 1

## 2013-08-15 MED ORDER — NITROGLYCERIN 0.4 MG SL SUBL: 0.4000 mg | SUBLINGUAL_TABLET | SUBLINGUAL | Status: DC | PRN

## 2013-08-15 MED ORDER — BIOTENE DRY MOUTH MT LIQD
15.0000 mL | Freq: Two times a day (BID) | OROMUCOSAL | Status: DC
  Administered 2013-08-15 – 2013-08-19 (×7): 15 mL via OROMUCOSAL

## 2013-08-15 MED ORDER — LEVOTHYROXINE SODIUM 100 MCG PO TABS
100.0000 ug | ORAL_TABLET | Freq: Every day | ORAL | Status: DC
  Administered 2013-08-16 – 2013-08-19 (×4): 100 ug via ORAL
  Filled 2013-08-15 (×5): qty 1

## 2013-08-15 MED ORDER — AMIODARONE HCL IN DEXTROSE 360-4.14 MG/200ML-% IV SOLN
60.0000 mg/h | INTRAVENOUS | Status: AC
  Administered 2013-08-15: 60 mg/h via INTRAVENOUS
  Filled 2013-08-15: qty 200

## 2013-08-15 MED ORDER — OMEGA-3-ACID ETHYL ESTERS 1 G PO CAPS
2.0000 g | ORAL_CAPSULE | Freq: Every day | ORAL | Status: DC
  Administered 2013-08-15 – 2013-08-19 (×5): 2 g via ORAL
  Filled 2013-08-15 (×5): qty 2

## 2013-08-15 MED ORDER — TAMSULOSIN HCL 0.4 MG PO CAPS
0.4000 mg | ORAL_CAPSULE | ORAL | Status: DC
  Administered 2013-08-15 – 2013-08-19 (×5): 0.4 mg via ORAL
  Filled 2013-08-15 (×4): qty 1

## 2013-08-15 MED ORDER — ACETAMINOPHEN 325 MG PO TABS
650.0000 mg | ORAL_TABLET | Freq: Four times a day (QID) | ORAL | Status: DC | PRN
  Administered 2013-08-19: 650 mg via ORAL
  Filled 2013-08-15: qty 2

## 2013-08-15 MED ORDER — ACETAMINOPHEN 650 MG RE SUPP
650.0000 mg | Freq: Four times a day (QID) | RECTAL | Status: DC | PRN
Start: 2013-08-15 — End: 2013-08-19

## 2013-08-15 MED ORDER — ALPRAZOLAM 0.5 MG PO TABS
0.5000 mg | ORAL_TABLET | Freq: Four times a day (QID) | ORAL | Status: DC
  Administered 2013-08-15 – 2013-08-19 (×12): 0.5 mg via ORAL
  Filled 2013-08-15 (×13): qty 1

## 2013-08-15 MED ORDER — WARFARIN - PHARMACIST DOSING INPATIENT: Freq: Every day | Status: DC

## 2013-08-15 MED ORDER — SODIUM CHLORIDE 0.9 % IV SOLN: 250.0000 mL | INTRAVENOUS | Status: DC | PRN

## 2013-08-15 MED ORDER — SODIUM CHLORIDE 0.9 % IJ SOLN
3.0000 mL | Freq: Two times a day (BID) | INTRAMUSCULAR | Status: DC
  Administered 2013-08-16 – 2013-08-19 (×6): 3 mL via INTRAVENOUS

## 2013-08-15 NOTE — ED Provider Notes (Signed)
CSN: [id number]     Arrival date & time 08/15/13  0319 History   First MD Initiated Contact with Patient 08/15/13 [phone]     Chief Complaint  Patient presents with  . Defib fired    (Consider location/radiation/quality/duration/timing/severity/associated sxs/prior Treatment) The history is provided by the patient, a relative and the spouse.   80 year old male has an implanted defibrillator and it fired yesterday. He called his cardiologist to advise him that if it fired again, he should come to the ED. It fired twice tonight. On one occasion, he started to feel like things are getting back before the device fired. However, in the other 2 occasions, he did not have that program. He denies any chest pain, palpitations. Family noted a fever tonight and temperature was 101 at home. They also noted a sore on his left ear which had some periodic drainage. Patient denies chest pain, dyspnea, nausea. He has not had any chills or sweats.  Past Medical History  Diagnosis Date  . Hypertension   . CAD (coronary artery disease)     remote CABG  . Ischemic cardiomyopathy   . Chronic systolic heart failure   . Automatic implantable cardiac defibrillator in situ   . Atrial fibrillation   . Atrial flutter   . Peripheral vascular disease   . Hypercholesteremia   . Hypothyroid   . Colitis, ischemic     has ileostomy in place x 14 years  . Chronic renal insufficiency   . BPH with urinary obstruction   . History of prostate cancer   . DJD (degenerative joint disease)   . 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 Anxiety   . Anemia   . PVD (peripheral vascular disease)   . 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 High risk medication use     on amiodarone therapy   Past Surgical History  Procedure Laterality Date  . Coronary artery bypass graft  1998  . Total colectomy and ileostomy for ischemic colitis  1998  . Doppler echocardiography  2006, 2007  . Cardiac  catheterization  10/26/1999   Family History  Problem Relation Age of Onset  . Colon cancer Brother   . Esophageal cancer Brother   . Diabetes Mother   . Heart disease Father    History  Substance Use Topics  . Smoking status: Former Research scientist (life sciences)  . Smokeless tobacco: Not on file  . Alcohol Use: No    Review of Systems  All other systems reviewed and are negative.    Allergies  Ciprofloxacin hcl; Amoxicillin; Clindamycin; and Penicillins  Home Medications   Current Outpatient Rx  Name  Route  Sig  Dispense  Refill  . amiodarone (PACERONE) 200 MG tablet      Take 1/2 tablet by mouth once daily or as directed          . EXPIRED: amitriptyline (ELAVIL) 25 MG tablet   Oral   Take 1 tablet (25 mg total) by mouth at bedtime.   90 tablet   3   . calcitRIOL (ROCALTROL) 0.5 MCG capsule      Take one tablet by mouth every other day Per Dr. Jimmy Footman         . carvedilol (COREG) 3.125 MG tablet      TAKE ONE TABLET BY MOUTH TWICE A DAY WITH MEALS   180 tablet   1   . donepezil (ARICEPT) 10 MG tablet   Oral   Take 10  mg by mouth at bedtime.          . fish oil-omega-3 fatty acids 1000 MG capsule   Oral   Take 2 g by mouth daily.           . furosemide (LASIX) 40 MG tablet      Take 2 tablets by mouth in the morning   30 tablet      . isosorbide dinitrate (ISORDIL) 10 MG tablet      TAKE ONE TABLET BY MOUTH THREE TIMES DAILY   270 tablet   2     **Patient requests 90 days supply**   . levothyroxine (SYNTHROID, LEVOTHROID) 100 MCG tablet      Take one (1) tablet(s) once daily   90 tablet   3   . Multiple Vitamins-Minerals (MULTIVITAMIN & MINERAL PO)   Oral   Take 1 tablet by mouth daily.           . nitroGLYCERIN (NITROSTAT) 0.4 MG SL tablet   Sublingual   Place 1 tablet (0.4 mg total) under the tongue every 5 (five) minutes as needed.   30 tablet   11   . EXPIRED: omeprazole (PRILOSEC) 40 MG capsule   Oral   Take 1 capsule (40 mg total) by  mouth daily.   30 capsule   3   . sodium bicarbonate 648 MG tablet      Take 2  tablets by mouth two times daily         . Tamsulosin HCl (FLOMAX) 0.4 MG CAPS   Oral   Take 0.4 mg by mouth daily after supper. As directed by Dr. Rosana Hoes          . warfarin (COUMADIN) 5 MG tablet      take as directed by coumadin clinic   90 tablet   1    BP 124/44  Pulse 78  Temp(Src) 98.3 F (36.8 C) (Oral)  Resp 18  Ht 5\' 8"  (1.727 m)  Wt 174 lb (78.926 kg)  BMI 26.46 kg/m2  SpO2 96% Physical Exam  Nursing note and vitals reviewed.  80 year old male, resting comfortably and in no acute distress. Vital signs are normal. Oxygen saturation is 96%, which is normal. Head is normocephalic and atraumatic. PERRLA, EOMI. Oropharynx is clear. Neck is nontender and supple without adenopathy or JVD. Back is nontender and there is no CVA tenderness. Lungs are clear without rales, wheezes, or rhonchi. Chest is nontender. Defibrillator is present in the left subclavian area. Heart has regular rate and rhythm without murmur. Abdomen has an ileostomy present in the right midabdomen, and there is a large ventral hernia present. Abdomen is soft and nontender. Extremities have no cyanosis or edema, full range of motion is present. There is an ulcerated area on the lateral aspect of the left heel but it the ulcer appears to have scabbed over and there is no drainage and no erythema. Skin is warm and dry without rash. Neurologic: Mental status is normal, cranial nerves are intact, there are no motor or sensory deficits.  ED Course  Procedures (including critical care time) Labs Review Results for orders placed during the hospital encounter of 08/15/13  CBC WITH DIFFERENTIAL      Result Value Range   WBC 24.3 (*) 4.0 - 10.5 K/uL   RBC 4.01 (*) 4.22 - 5.81 MIL/uL   Hemoglobin 13.0  13.0 - 17.0 g/dL   HCT 38.4 (*) 39.0 - 52.0 %   MCV 95.8  78.0 - 100.0 fL   MCH 32.4  26.0 - 34.0 pg   MCHC 33.9  30.0  - 36.0 g/dL   RDW 14.3  11.5 - 15.5 %   Platelets 173  150 - 400 K/uL   Neutrophils Relative % 90 (*) 43 - 77 %   Neutro Abs 21.8 (*) 1.7 - 7.7 K/uL   Lymphocytes Relative 5 (*) 12 - 46 %   Lymphs Abs 1.2  0.7 - 4.0 K/uL   Monocytes Relative 6  3 - 12 %   Monocytes Absolute 1.3 (*) 0.1 - 1.0 K/uL   Eosinophils Relative 0  0 - 5 %   Eosinophils Absolute 0.0  0.0 - 0.7 K/uL   Basophils Relative 0  0 - 1 %   Basophils Absolute 0.0  0.0 - 0.1 K/uL  BASIC METABOLIC PANEL      Result Value Range   Sodium 136 (*) 137 - 147 mEq/L   Potassium 3.5 (*) 3.7 - 5.3 mEq/L   Chloride 96  96 - 112 mEq/L   CO2 22  19 - 32 mEq/L   Glucose, Bld 121 (*) 70 - 99 mg/dL   BUN 65 (*) 6 - 23 mg/dL   Creatinine, Ser 4.09 (*) 0.50 - 1.35 mg/dL   Calcium 9.7  8.4 - 10.5 mg/dL   GFR calc non Af Amer 12 (*) >90 mL/min   GFR calc Af Amer 14 (*) >90 mL/min  URINALYSIS, ROUTINE W REFLEX MICROSCOPIC      Result Value Range   Color, Urine YELLOW  YELLOW   APPearance CLOUDY (*) CLEAR   Specific Gravity, Urine 1.014  1.005 - 1.030   pH 5.0  5.0 - 8.0   Glucose, UA NEGATIVE  NEGATIVE mg/dL   Hgb urine dipstick NEGATIVE  NEGATIVE   Bilirubin Urine NEGATIVE  NEGATIVE   Ketones, ur NEGATIVE  NEGATIVE mg/dL   Protein, ur NEGATIVE  NEGATIVE mg/dL   Urobilinogen, UA 0.2  0.0 - 1.0 mg/dL   Nitrite NEGATIVE  NEGATIVE   Leukocytes, UA NEGATIVE  NEGATIVE  PROTIME-INR      Result Value Range   Prothrombin Time 29.9 (*) 11.6 - 15.2 seconds   INR 2.98 (*) 0.00 - 1.49  TROPONIN I      Result Value Range   Troponin I 0.40 (*) <0.30 ng/mL   Imaging Review: Dg Chest Port 1 View  08/15/2013   CLINICAL DATA:  Fever.  EXAM: PORTABLE CHEST - 1 VIEW  COMPARISON:  02/01/2012  FINDINGS: Dual-chamber left approach ICD/pacer, lead orientation stable from prior. Cardiomegaly, status post CABG. No edema or consolidation. No effusion or pneumothorax.  IMPRESSION: Negative for pneumonia.   Electronically Signed   By: Jorje Guild  M.D.   On: 08/15/2013 05:02      EKG Interpretation    Date/Time:  Friday August 15 2013 03:54:54 EST Ventricular Rate:  78 PR Interval:    QRS Duration: 166 QT Interval:  488 QTC Calculation: 556 R Axis:   84 Text Interpretation:  Sinus rhythm Premature atrial complexes IVCD, consider atypical LBBB When compared with ECG of 03/13/2007, No significant change was found Confirmed by [facility]  MD, Jiovanni Heeter ([id number]) on 08/15/2013 4:11:55 AM            MDM   1. Implantable cardioverter-defibrillator discharge   2. Renal insufficiency    Defibrillator that fired. It sounds as if at least one of the episodes was appropriate. Manufacturer's representative has been contacted to interrogate  the defibrillator. Screening labs will be obtained. Because of a reported fever at home, urinalysis and chest x-ray will be obtained to look for occult infection.  The device is been interrogated and it fired several times for episodes of ventricular tachycardia and one time for an episode of atrial tachycardia. Cardiology consultation will be obtained to 2 decide whether to lyse needs to be reprogrammed and/or whether his medications need to be adjusted. Minor elevation of troponin is felt to be related to the bradyarrhythmia and defibrillator discharge and not felt to be significant. It is noted that there has been worsening of his known renal insufficiency with creatinine now up to 4. Case is discussed with Dr. Haroldine Laws of [facility]. He will arrange for one of the electrophysiology cardiologists to come and see the patient.  Delora Fuel, MD 46/65/99 [id number]

## 2013-08-15 NOTE — H&P (Signed)
Hospital Admission Note Date: 08/15/2013  Patient name: [name] Medical record number: [id number] Date of birth: 12-19-1924 Age: 80 y.o. Gender: male PCP: PROCHNAU,CAROLINE, MD  Medical Service: IMTS  Attending physician: Dr. Murlean Caller  Internal Medicine Teaching Service Contact Information  Weekday Hours (7AM-5PM):   1st Contact:  Dr. Stann Mainland            Pager: [phone] 2nd Contact: Dr.  Aundra Dubin           Pager:   [phone]  ** If no return call within 15 minutes (after trying both pagers listed above), please call after hours pagers.   After 5 pm or weekends: 1st Contact: Pager: [phone] 2nd Contact: Pager: [phone]  Chief Complaint: defibrillator firing and fever  History of Present Illness:  The patient is 80 year old male with past medical history of coronary disease (s/p CABG - 1998), ischemic cardiomyopathy (s/p BSX dual chamber ICD implanted 2003 with generator change 2011), atrial fibrillation (on Warfarin and low dose Amiodarone), and chronic renal insufficiency (baseline creat 2. 5 to 3.2), hypertension, hyperlipidemia, hypothyroidism, peripheral vascular disease, history of prostate cancer, who presents with defibrillator firing and fever.   At baseline, patient is living with his wife at home. He uses walker to walk around in his house. He was in his usual state of health until two days ago when his ICD fired. Patient reports that his ICD fired twice  two days ago when he was "standing and thinking". He did not have chest pain, shortness of breath or palpitation.  He called his cardiologist who advised him that if it fired again, he should come to the ED. His ICD fired twice last night. Therefore he came to ED for evaluation. Device interrogation in ED reveals appropriate therapy for VT with failed ATP followed by ICD shocks which restored SR.  He has also had 2 episodes of atrial fib with RVR for which he received inappropriate therapy (ATP) per Dr. Tanna Furry note.   Family  noted a fever last night and temperature was 101 at home. The patient does not have running nose, sore throat, SOB or chest pain. He has chills, not feeling hot. He has mild chronic cough, which has not been worsening recently. No sick contact, but he lives with 44 yo grandson and a daughter who is working in the hospital.   ROS:  Denies fever, fatigue, headaches, cough, chest pain, SOB,  abdominal pain, diarrhea, constipation, dysuria, urgency, hematuria, joint pain or leg swelling. Has increased urinary frequency, but no dysuria. Has pain over left heel ulcer. No pain surrounding his ileostomy. No change in the output from ileostomy.   Meds: Current Outpatient Rx  Name  Route  Sig  Dispense  Refill  . ALPRAZolam (XANAX) 0.5 MG tablet   Oral   Take 1 tablet by mouth 4 (four) times daily. 1 tab in the a.m., .5 noon, and 3p.m., 1 tab at bedtime         . amiodarone (PACERONE) 200 MG tablet      Take 1/2 tablet by mouth once daily or as directed          . amitriptyline (ELAVIL) 25 MG tablet   Oral   Take 25 mg by mouth at bedtime.         . calcitRIOL (ROCALTROL) 0.5 MCG capsule      Take one tablet by mouth every other day Per Dr. Jimmy Footman         . carvedilol (COREG)  3.125 MG tablet   Oral   Take 3.125 mg by mouth 2 (two) times daily with a meal. TAKE ONE TABLET BY MOUTH TWICE A DAY WITH MEALS         . donepezil (ARICEPT) 10 MG tablet   Oral   Take 10 mg by mouth at bedtime.          . fish oil-omega-3 fatty acids 1000 MG capsule   Oral   Take 2 g by mouth daily.           . furosemide (LASIX) 40 MG tablet   Oral   Take 80 mg by mouth daily. Take 2 tablets by mouth in the morning         . isosorbide dinitrate (ISORDIL) 10 MG tablet      TAKE ONE TABLET BY MOUTH THREE TIMES DAILY         . levothyroxine (SYNTHROID, LEVOTHROID) 100 MCG tablet      Take one (1) tablet(s) once daily   90 tablet   3   . Multiple Vitamins-Minerals (MULTIVITAMIN &  MINERAL PO)   Oral   Take 1 tablet by mouth daily.           . nitroGLYCERIN (NITROSTAT) 0.4 MG SL tablet   Sublingual   Place 1 tablet (0.4 mg total) under the tongue every 5 (five) minutes as needed.   30 tablet   11   . omeprazole (PRILOSEC) 40 MG capsule   Oral   Take 40 mg by mouth daily.         . sodium bicarbonate 648 MG tablet      Take 2  tablets by mouth two times daily         . Tamsulosin HCl (FLOMAX) 0.4 MG CAPS   Oral   Take 0.4 mg by mouth daily after supper. As directed by Dr. Rosana Hoes          . warfarin (COUMADIN) 5 MG tablet      take as directed by coumadin clinic   90 tablet   1     Allergies: Allergies as of 08/15/2013 - Review Complete 08/15/2013  Allergen Reaction Noted  . Ciprofloxacin hcl  08/15/2013  . Amoxicillin    . Clindamycin    . Penicillins     Past Medical History  Diagnosis Date  . Hypertension   . CAD (coronary artery disease)     remote CABG  . Ischemic cardiomyopathy   . Chronic systolic heart failure   . Atrial fibrillation   . Atrial flutter   . Peripheral vascular disease   . Hypercholesteremia   . Hypothyroid   . Colitis, ischemic     has ileostomy in place x 14 years  . Chronic renal insufficiency   . BPH with urinary obstruction   . History of prostate cancer   . DJD (degenerative joint disease)   . 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 Anxiety   . Anemia   . PVD (peripheral vascular disease)   . 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 High risk medication use     on amiodarone therapy   Past Surgical History  Procedure Laterality Date  . Coronary artery bypass graft  1998  . Total colectomy and ileostomy for ischemic colitis  1998  . Doppler echocardiography  2006, 2007  . Cardiac catheterization  10/26/1999  . Cardiac defibrillator placement  2003; 2011    BSX dual  chamber ICD implanted by Dr Lovena Le 2003 with gen change 2011   Family History  Problem Relation  Age of Onset  . Colon cancer Brother   . Esophageal cancer Brother   . Diabetes Mother   . Heart disease Father    History   Social History  . Marital Status: Married    Spouse Name: peggy Portela    Number of Children: N/A  . Years of Education: N/A   Occupational History  . retired    Social History Main Topics  . Smoking status: Former Research scientist (life sciences)  . Smokeless tobacco: Not on file  . Alcohol Use: No  . Drug Use: No  . Sexual Activity: Not Currently   Other Topics Concern  . Not on file   Social History Narrative  . No narrative on file    Review of Systems: Full 14-point review of systems otherwise negative except as noted above in HPI.  Physical Exam:   Filed Vitals:   08/15/13 0800 08/15/13 0807 08/15/13 0830 08/15/13 0906  BP: 132/48  129/49 119/45  Pulse: 68  69 71  Temp:  99.2 F (37.3 C)    TempSrc:  Oral    Resp: 16  14 14   Height:      Weight:      SpO2: 94%  95% 92%    General: Not in acute distress.  HEENT: PERRL, EOMI, no scleral icterus, No JVD or bruit. Pharynx is erythematous, no tonsillar exudation Cardiac: S1/S2, RRR, No murmurs, gallops or rubs. Defibrillator is present in the left subclavian area. Pulm: Good air movement bilaterally. Clear to auscultation bilaterally. No rales, wheezing, rhonchi or rubs. Abd: Soft, nondistended, nontender, no rebound pain, no organomegaly, BS present. Abdomen has an ileostomy present in the right mid abdomen. There is a large ventral hernia present. Ileostomy looks normal, there is no tenderness surrounding the ileostomy area. Ext: No edema. 2+DP/PT pulse bilaterally. There is a small ulcerated area on the lateral aspect of the left heel, but the ulcer appears to have scabbed over and there is no drainage and no erythema. Musculoskeletal: No joint deformities, erythema, or stiffness, ROM full Skin: No rashes.  Neuro: Alert and oriented X3, cranial nerves II-XII grossly intact, muscle strength 5/5 in all  extremeties, sensation to light touch intact. Brachial reflex 2+ bilaterally. Knee reflex 1+ bilaterally.  Psych: [name] not psychotic, no suicidal or hemocidal ideation.  Lab results: Basic Metabolic Panel:  Recent Labs  08/15/13 0405 08/15/13 0425  NA 136*  --   K 3.5*  --   CL 96  --   CO2 22  --   GLUCOSE 121*  --   BUN 65*  --   CREATININE 4.09*  --   CALCIUM 9.7  --   MG  --  1.7   Liver Function Tests: No results found for this basename: AST, ALT, ALKPHOS, BILITOT, PROT, ALBUMIN,  in the last 72 hours No results found for this basename: LIPASE, AMYLASE,  in the last 72 hours No results found for this basename: AMMONIA,  in the last 72 hours CBC:  Recent Labs  08/15/13 0405  WBC 24.3*  NEUTROABS 21.8*  HGB 13.0  HCT 38.4*  MCV 95.8  PLT 173   Cardiac Enzymes:  Recent Labs  08/15/13 0425  TROPONINI 0.40*   BNP: No results found for this basename: PROBNP,  in the last 72 hours D-Dimer: No results found for this basename: DDIMER,  in the last 72 hours CBG:  No results found for this basename: GLUCAP,  in the last 72 hours Hemoglobin A1C: No results found for this basename: HGBA1C,  in the last 72 hours Fasting Lipid Panel: No results found for this basename: CHOL, HDL, LDLCALC, TRIG, CHOLHDL, LDLDIRECT,  in the last 72 hours Thyroid Function Tests: No results found for this basename: TSH, T4TOTAL, FREET4, T3FREE, THYROIDAB,  in the last 72 hours Anemia Panel: No results found for this basename: VITAMINB12, FOLATE, FERRITIN, TIBC, IRON, RETICCTPCT,  in the last 72 hours Coagulation:  Recent Labs  08/15/13 0405  LABPROT 29.9*  INR 2.98*   Urine Drug Screen: Drugs of Abuse  No results found for this basename: labopia,  cocainscrnur,  labbenz,  amphetmu,  thcu,  labbarb    Alcohol Level: No results found for this basename: ETH,  in the last 72 hours Urinalysis:  Recent Labs  08/15/13 0551  COLORURINE YELLOW  LABSPEC 1.014  PHURINE 5.0   GLUCOSEU NEGATIVE  HGBUR NEGATIVE  BILIRUBINUR NEGATIVE  KETONESUR NEGATIVE  PROTEINUR NEGATIVE  UROBILINOGEN 0.2  [place]ed. Labs:  Imaging results:  Dg Chest Port 1 View  08/15/2013   CLINICAL DATA:  Fever.  EXAM: PORTABLE CHEST - 1 VIEW  COMPARISON:  02/01/2012  FINDINGS: Dual-chamber left approach ICD/pacer, lead orientation stable from prior. Cardiomegaly, status post CABG. No edema or consolidation. No effusion or pneumothorax.  IMPRESSION: Negative for pneumonia.   Electronically Signed   By: Jorje Guild M.D.   On: 08/15/2013 05:02    Other results:  Assessment & Plan by Problem:  #: Defibrillator discharge: He has hx of severe CHF and s/p of ICD. Last echo 2007 demonstrated EF 20%, moderate hypokinesis of septal wall, akinesis of the inferoposterior wall and hypokinesis of the apical wall. The device is been interrogated and it fired several times for episodes of ventricular tachycardia and one time for an episode of atrial tachycardia. Etiology was not clear. It may be related to his fever. He is less likely to have UTI and PNA given negative UA and CXR.  Though he dose not have typical symptoms for flu or viral URI, his phayringx is erythematous, indicating possible viral infection. another possibility is cardiac ischemia given slightly elevated poc of trop in Ed (0.40). Currently patient does not have chest pain or shortness of breath. No signs of acute CHF exacerbation. Cardiology was consulted by ED. Dr. Lovena Le evaluated the patient and recommended starting amiodarone drip for 48 hours, then transition to oral at 400-800 daily. I would like to start empirc treatment for possible flu with Tamiflu, but the patient declined and would like to do flu PCR to confirm the diagnosis before starting any antibiotics.  -will amit to tele bed -will start amiodarone gtt per card -continue coreg 3.125 mg bid and isordil  -check Flu pcr -repeat CXR in  AM to rule out possible early CAP - blood culture x 2 - trop q6h x 3   #: Chronic systolic heart failure: 2-D echo on 04/04/06 showed EF 20%. Currently patient is taking Coreg and Lasix 80 mg twice a day at home. He is clinically euvolemic. -will continue lasix and coreg -check pro-BNP -he may benefit from adding ACEI  #: HTN: bp is 129/49 on admission. -will continue lasix and coreg  #Hypothyroidism: TSH was 3.12 on 3/60/12. [name] on Synthroid 100 mcg daily at home. -will continue home med. -check TSH   #: Atrial fibrillation: on coumadin. INR is  2.98 -will continue Coumadin per pharmacy  #: CKD-IV: Baseline creatinine 2.5-3.2. Creatinine is slightly elevated from baseline at 4.09 on admission. [name] sodium bicarbonate at home. His bicarbonate is 22 on admission.  -will continue home bicarbonate - continue home Calcitriol -Followup BMP for renal function  #: Hx of prostate cancer: s/p radiation therapy approximately 13 years ago. He has been followed up with Dr. Iven Finn?  Last seen was 6 month ago. His PSA was slightly elevated on that visit at about 5 per patient.  - continue Flomax -outpt follow up with Dr. Deterdin  #: left heel ulcer: chronic, no drainage, does not look like infected. Poor feeding is likely related to the PVD. -will consult wound care   #:  F/E/N  -SL:  -Hypokalemia:  Kdur 40 meQ x1. will monitor electrolytes by checking BMP in AM - Diet: Heart health diet.  # DVT px: Heparin sq  Dispo: Disposition is deferred at this time, awaiting improvement of current medical problems. Anticipated discharge in approximately 2 to 3 day(s).   The patient does have a current PCP (PROCHNAU,CAROLINE, MD), therefore is requiring OPC follow-up after discharge.   The patient does not have transportation limitations that hinder transportation to clinic appointments.  Signed:  Ivor Costa, MD PGY3, Internal Medicine Teaching Service Pager: [phone]  08/15/2013,  10:05 AM

## 2013-08-15 NOTE — ED Notes (Signed)
Patient presents from home with family stating that his defib fired once yesterday and 2 times tonight.

## 2013-08-15 NOTE — ED Provider Notes (Signed)
Patient signed out to me by Doctor Roxanne Mins awaiting cardiology evaluation. Doctor Lovena Le has seen the patient and will manage the patient's V. tach with IV amiodarone. He has asked that internal medicine admit the patient, however, because of the patient's multiple medical problems. In addition to his chronic medical problems, patient has been experiencing a fever yesterday, has a leukocytosis with some skin breakdown. No other obvious source for the fever as the patient does not have pneumonia on x-ray. Urinalysis did not appear as significantly infected. I have added blood culture, urine culture and flu swab. Discussed with internal medicine teaching service, will see the patient for admission.  Orpah Greek, MD 08/15/13 [phone]

## 2013-08-15 NOTE — Progress Notes (Signed)
ANTICOAGULATION CONSULT NOTE - Initial Consult  Pharmacy Consult for Coumadin Indication: atrial fibrillation  Allergies  Allergen Reactions  . Ciprofloxacin Hcl     Rash, itching  . Amoxicillin     REACTION: rash  . Clindamycin   . Penicillins     REACTION: rash    Patient Measurements: Height: 5\' 8"  (172.7 cm) Weight: 174 lb (78.926 kg) IBW/kg (Calculated) : 68.4  Vital Signs: Temp: 99.2 F (37.3 C) (01/09 0807) Temp src: Oral (01/09 0807) BP: 119/43 mmHg (01/09 1013) Pulse Rate: 66 (01/09 1013)  Labs:  Recent Labs  08/15/13 0405 08/15/13 0425  HGB 13.0  --   HCT 38.4*  --   PLT 173  --   LABPROT 29.9*  --   INR 2.98*  --   CREATININE 4.09*  --   TROPONINI  --  0.40*    Estimated Creatinine Clearance: 12.5 ml/min (by C-G formula based on Cr of 4.09).   Medical History: Past Medical History  Diagnosis Date  . Hypertension   . CAD (coronary artery disease)     remote CABG  . Ischemic cardiomyopathy   . Chronic systolic heart failure   . Atrial fibrillation   . Atrial flutter   . Peripheral vascular disease   . Hypercholesteremia   . Hypothyroid   . Colitis, ischemic     has ileostomy in place x 14 years  . Chronic renal insufficiency   . BPH with urinary obstruction   . History of prostate cancer   . DJD (degenerative joint disease)   . 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 Anxiety   . Anemia   . PVD (peripheral vascular disease)   . 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 High risk medication use     on amiodarone therapy    Medications:  See electronic med rec  Assessment: 80 y.o. male presents with ICD firing and fever. On coumadin PTA for afib. Baseline INR 2.98 (upper end of therapeutic). CBC ok at baseline Home dose 5 mg M/W/F and 2.5mg  T/T/S/S  Noted addition of amio gtt (pt on 100mg  po amio PTA)  Goal of Therapy:  INR 2-3 Monitor platelets by anticoagulation protocol: Yes   Plan:  1. Daily  INR 2. Coumadin 2.5mg  tonight  Sherlon Handing, PharmD, BCPS Clinical pharmacist, pager [phone] 08/15/2013,1:18 PM

## 2013-08-15 NOTE — Consult Note (Signed)
ELECTROPHYSIOLOGY CONSULT NOTE    Patient ID: [name] MRN: [id number], DOB/AGE: [birth year] 80 y.o.  Admit date: 08/15/2013  Primary Physician: Ernestene Kiel, MD Primary Cardiologist: Cristopher Peru, MD  Reason for Consultation: ICD firing  HPI:  [name] is a 80 year old male with a past medical history significant for coronary disease (s/p CABG - 1998), ischemic cardiomyopathy (s/p BSX dual chamber ICD implanted 2003 with generator change 2011), atrial fibrillation (on Warfarin and low dose Amiodarone), and chronic renal insufficiency (baseline creat 3).  He was in his usual state of health until yesterday when his ICD fired.  He denied chest pain or sob but did experience a hard shaking chill. His temperature was checked and was first 99.5 then 101.5 according to his daughter, and called the office, and was made an appt to see me in the office today.  Last night, he received 2 more ICD shocks and came to [facility] ER for further evaluation.  Device interrogation reveals appropriate therapy for VT with failed ATP followed by ICD shocks which restored SR.  He has also had 2 episodes of atrial fib with RVR for which he received inappropriate therapy (ATP).   Last ischemic evaluation was in 2007 with myoview.  Last echo 2007 demonstrated EF 20%, moderate hypokinesis of septal wall, akinesis of the inferoposterior wall and hypokinesis of the apical wall.    He denies chest pain, shortness of breath, or increased lower extremity edema.  He denies GI symptoms or other URI symptoms.    Labs are remarkable for slightly elevated troponin, WBC 24K, creat 4 (baseline ~3), INR 2.98.  EP has been asked to consult for treatment options.   Past Medical History  Diagnosis Date  . Hypertension   . CAD (coronary artery disease)     remote CABG  . Ischemic cardiomyopathy   . Chronic systolic heart failure   . Automatic implantable cardiac defibrillator in situ   . Atrial fibrillation   .  Atrial flutter   . Peripheral vascular disease   . Hypercholesteremia   . Hypothyroid   . Colitis, ischemic     has ileostomy in place x 14 years  . Chronic renal insufficiency   . BPH with urinary obstruction   . History of prostate cancer   . DJD (degenerative joint disease)   . 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 Anxiety   . Anemia   . PVD (peripheral vascular disease)   . 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 High risk medication use     on amiodarone therapy     Surgical History:  Past Surgical History  Procedure Laterality Date  . Coronary artery bypass graft  1998  . Total colectomy and ileostomy for ischemic colitis  1998  . Doppler echocardiography  2006, 2007  . Cardiac catheterization  10/26/1999     Allergies:  Allergies  Allergen Reactions  . Ciprofloxacin Hcl     Rash, itching  . Amoxicillin     REACTION: rash  . Clindamycin   . Penicillins     REACTION: rash    History   Social History  . Marital Status: Married    Spouse Name: peggy Lyman    Number of Children: N/A  . Years of Education: N/A   Occupational History  . retired    Social History Main Topics  . Smoking status: Former Research scientist (life sciences)  . Smokeless tobacco: Not on file  . Alcohol Use:  No  . Drug Use: No  . Sexual Activity: Not Currently   Other Topics Concern  . Not on file   Social History Narrative  . No narrative on file     Family History  Problem Relation Age of Onset  . Colon cancer Brother   . Esophageal cancer Brother   . Diabetes Mother   . Heart disease Father     Physical Exam  BP 141/52  Pulse 67  Temp(Src) 98.3 F (36.8 C) (Oral)  Resp 17  Ht 5\' 8"  (1.727 m)  Wt 174 lb (78.926 kg)  BMI 26.46 kg/m2  SpO2 94% Elderly, chronically ill appearing NAD HEENT: Unremarkable Neck:  7 cm JVD, no thyromegally Back:  No CVA tenderness Lungs:  Clear except for scattered basilar rales HEART:  IRegular rate rhythm, no murmurs, no  rubs, no clicks Abd:  soft, positive bowel sounds, no organomegally, no rebound, no guarding, ostomy tube present in the lower quadrant Ext:  2 plus pulses, 2+ edema, no cyanosis, chronic arthritic changes, and a draining ulcer on the left heel. Skin:  No rashes no nodules Neuro:  CN II through XII intact, motor grossly intact  Labs:   Lab Results  Component Value Date   WBC 24.3* 08/15/2013   HGB 13.0 08/15/2013   HCT 38.4* 08/15/2013   MCV 95.8 08/15/2013   PLT 173 08/15/2013     Recent Labs Lab 08/15/13 0405  NA 136*  K 3.5*  CL 96  CO2 22  BUN 65*  CREATININE 4.09*  CALCIUM 9.7  GLUCOSE 121*    Radiology/Studies: Dg Chest Port 1 View 08/15/2013   CLINICAL DATA:  Fever.  EXAM: PORTABLE CHEST - 1 VIEW  COMPARISON:  02/01/2012  FINDINGS: Dual-chamber left approach ICD/pacer, lead orientation stable from prior. Cardiomegaly, status post CABG. No edema or consolidation. No effusion or pneumothorax.  IMPRESSION: Negative for pneumonia.   Electronically Signed   By: Jorje Guild M.D.   On: 08/15/2013 05:02   EKG: NSR with freq. pVC's and LBBB  TELEMETRY: sinus rhythm with PVC's  DEVICE HISTORY: BSX dual chamber ICD implanted 2003 with gen change 2011 - see HPI for device interrogation  A/P 1.Recurrent ICD shocks 2. VT 3. ICM, with chronic systolic heart failure 4. Chronic renal insufficiency 5. Elevated WBC with hard shaking chill, worrisome for pnuemonia or influenza or transient bacteremia 6. Indwelling ostomy 7. Foot ulcer, chronic, draining Rec: from an arrhythmia perspective, he will need admit with initiation of IV amiodarone for 48 hours, then transition to oral at 400-800 daily. His advanced age and multiple comorbidities make him not a candidate for invasive evaluation. Would get echo only for persistent bacteremia as we know he has longstanding LV dysfunction. He has multiple other issues and I am concerned about history provided by family (grandaughter is a Marine scientist)  suggestive of bacteremia or viremia. We will follow with primary team. In addition, he has an allergy to PCN and cephalosporins and will need careful consideration of anti-biotic choice.  Mikle Bosworth.D.

## 2013-08-15 NOTE — ED Notes (Signed)
Heart healthy meal tray ordered 

## 2013-08-15 NOTE — ED Notes (Signed)
Admitting MD at bedside.

## 2013-08-16 ENCOUNTER — Inpatient Hospital Stay (HOSPITAL_COMMUNITY): Payer: Medicare Other

## 2013-08-16 DIAGNOSIS — I10 Essential (primary) hypertension: Secondary | ICD-10-CM

## 2013-08-16 DIAGNOSIS — E039 Hypothyroidism, unspecified: Secondary | ICD-10-CM

## 2013-08-16 DIAGNOSIS — I509 Heart failure, unspecified: Secondary | ICD-10-CM

## 2013-08-16 DIAGNOSIS — M1460 Charcot's joint, unspecified site: Secondary | ICD-10-CM | POA: Diagnosis present

## 2013-08-16 DIAGNOSIS — N179 Acute kidney failure, unspecified: Secondary | ICD-10-CM

## 2013-08-16 DIAGNOSIS — N184 Chronic kidney disease, stage 4 (severe): Secondary | ICD-10-CM

## 2013-08-16 DIAGNOSIS — I4891 Unspecified atrial fibrillation: Secondary | ICD-10-CM

## 2013-08-16 DIAGNOSIS — Z0389 Encounter for observation for other suspected diseases and conditions ruled out: Secondary | ICD-10-CM

## 2013-08-16 DIAGNOSIS — I5020 Unspecified systolic (congestive) heart failure: Secondary | ICD-10-CM

## 2013-08-16 LAB — URINE CULTURE
Colony Count: NO GROWTH
Culture: NO GROWTH

## 2013-08-16 LAB — BASIC METABOLIC PANEL
BUN: 73 mg/dL — AB (ref 6–23)
CO2: 21 mEq/L (ref 19–32)
CREATININE: 4.28 mg/dL — AB (ref 0.50–1.35)
Calcium: 9.5 mg/dL (ref 8.4–10.5)
Chloride: 95 mEq/L — ABNORMAL LOW (ref 96–112)
GFR calc Af Amer: 13 mL/min — ABNORMAL LOW (ref >90)
GFR calc non Af Amer: 11 mL/min — ABNORMAL LOW (ref >90)
Glucose, Bld: 102 mg/dL — ABNORMAL HIGH (ref 70–99)
Potassium: 4.4 mEq/L (ref 3.7–5.3)
Sodium: 134 mEq/L — ABNORMAL LOW (ref 137–147)

## 2013-08-16 LAB — CBC
HEMATOCRIT: 34.5 % — AB (ref 39.0–52.0)
HEMOGLOBIN: 11.9 g/dL — AB (ref 13.0–17.0)
MCH: 32.3 pg (ref 26.0–34.0)
MCHC: 34.5 g/dL (ref 30.0–36.0)
MCV: 93.8 fL (ref 78.0–100.0)
Platelets: 142 10*3/uL — ABNORMAL LOW (ref 150–400)
RBC: 3.68 MIL/uL — ABNORMAL LOW (ref 4.22–5.81)
RDW: 14.6 % (ref 11.5–15.5)
WBC: 20.3 10*3/uL — ABNORMAL HIGH (ref 4.0–10.5)

## 2013-08-16 LAB — PROTIME-INR
INR: 2.61 — ABNORMAL HIGH (ref 0.00–1.49)
Prothrombin Time: 27 seconds — ABNORMAL HIGH (ref 11.6–15.2)

## 2013-08-16 LAB — T3: T3 TOTAL: 34.2 ng/dL — AB (ref 80.0–204.0)

## 2013-08-16 LAB — TROPONIN I
Troponin I: <0.3 ng/mL (ref <0.30)
Troponin I: <0.3 ng/mL (ref <0.30)

## 2013-08-16 LAB — GLUCOSE, CAPILLARY: GLUCOSE-CAPILLARY: 115 mg/dL — AB (ref 70–99)

## 2013-08-16 LAB — T4, FREE: FREE T4: 1.71 ng/dL (ref 0.80–1.80)

## 2013-08-16 MED ORDER — WARFARIN SODIUM 2.5 MG PO TABS
2.5000 mg | ORAL_TABLET | Freq: Once | ORAL | Status: AC
  Administered 2013-08-16: 2.5 mg via ORAL
  Filled 2013-08-16: qty 1

## 2013-08-16 NOTE — Evaluation (Signed)
Physical Therapy Evaluation Patient Details Name: [name] MRN: [id number] DOB: 1925/03/10 Today's Date: 08/16/2013 Time: 9735-3299 PT Time Calculation (min): 39 min  PT Assessment / Plan / Recommendation History of Present Illness  The patient is 80 year old male with past medical history of coronary disease (s/p CABG - 1998), ischemic cardiomyopathy (s/p BSX dual chamber ICD implanted 2003 with generator change 2011), atrial fibrillation (on Warfarin and low dose Amiodarone), and chronic renal insufficiency (baseline creat 2. 5 to 3.2), hypertension, hyperlipidemia, hypothyroidism, peripheral vascular disease, history of prostate cancer, who presents with defibrillator firing and fever. At baseline, patient is living with his wife at home. He uses walker to walk around in his house. He was in his usual state of health until two days ago when his ICD fired. Patient reports that his ICD fired twice  two days ago when he was "standing and thinking". He did not have chest pain, shortness of breath or palpitation.  He called his cardiologist who advised him that if it fired again, he should come to the ED. His ICD fired twice last night. Therefore he came to ED for evaluation. Device interrogation in ED reveals appropriate therapy for VT with failed ATP followed by ICD shocks which restored SR.  He has also had 2 episodes of atrial fib with RVR for which he received inappropriate therapy (ATP) per Dr. Tanna Furry note.   Clinical Impression  Pt admitted with the above. Pt currently with functional limitations due to the deficits listed below (see PT Problem List). Pt with generalized weakness limiting overall functional mobility and impaired gait.  Pt will benefit from skilled PT to increase their independence and safety with mobility to allow discharge to the venue listed below.       PT Assessment  Patient needs continued PT services    Follow Up Recommendations  SNF;Supervision/Assistance - 24  hour    Equipment Recommendations  None recommended by PT    Frequency Min 3X/week    Precautions / Restrictions Precautions Precautions: Fall;ICD/Pacemaker   Pertinent Vitals/Pain No c/o pain      Mobility  Bed Mobility Overal bed mobility: Needs Assistance Bed Mobility: Supine to Sit Supine to sit: Mod assist General bed mobility comments: (A) to elevate trunk and LE OOB with max cues for safe technique.  Transfers Overall transfer level: Needs assistance Equipment used: Rolling walker (2 wheeled) Transfers: Sit to/from Stand Sit to Stand: Mod assist Ambulation/Gait Ambulation/Gait assistance: Mod assist Ambulation Distance (Feet): 3 Feet (side steps towards recliner) Assistive device: Rolling walker (2 wheeled) Gait Pattern/deviations: Step-to pattern;Shuffle;Trunk flexed Gait velocity: decreased Gait velocity interpretation: <1.8 ft/sec, indicative of risk for recurrent falls General Gait Details: Pt has charcot foot     Exercises     PT Diagnosis: Difficulty walking;Generalized weakness  PT Problem List: Decreased strength;Decreased activity tolerance;Decreased balance;Decreased mobility;Decreased knowledge of use of DME;Decreased safety awareness;Impaired sensation PT Treatment Interventions: DME instruction;Gait training;Functional mobility training;Therapeutic activities;Therapeutic exercise;Balance training;Patient/family education     PT Goals(Current goals can be found in the care plan section) Acute Rehab PT Goals Patient Stated Goal: To get stronger and be able to walk again PT Goal Formulation: With patient/family Time For Goal Achievement: 08/23/13 Potential to Achieve Goals: Good  Visit Information  Last PT Received On: 08/16/13 Assistance Needed: +1 History of Present Illness: The patient is 80 year old male with past medical history of coronary disease (s/p CABG - 1998), ischemic cardiomyopathy (s/p BSX dual chamber ICD implanted 2003 with  generator change 2011), atrial fibrillation (on Warfarin and low dose Amiodarone), and chronic renal insufficiency (baseline creat 2. 5 to 3.2), hypertension, hyperlipidemia, hypothyroidism, peripheral vascular disease, history of prostate cancer, who presents with defibrillator firing and fever. At baseline, patient is living with his wife at home. He uses walker to walk around in his house. He was in his usual state of health until two days ago when his ICD fired. Patient reports that his ICD fired twice  two days ago when he was "standing and thinking". He did not have chest pain, shortness of breath or palpitation.  He called his cardiologist who advised him that if it fired again, he should come to the ED. His ICD fired twice last night. Therefore he came to ED for evaluation. Device interrogation in ED reveals appropriate therapy for VT with failed ATP followed by ICD shocks which restored SR.  He has also had 2 episodes of atrial fib with RVR for which he received inappropriate therapy (ATP) per Dr. Tanna Furry note.        Prior [place] expects to be discharged to:: Private residence Living Arrangements: Spouse/significant other;Children Type of Home: House Home Access: [place]: One [place]: Environmental consultant - 4 wheels;Walker - 2 wheels;Wheelchair - manual;Bedside commode Additional Comments: Pt has iliostomy bag Prior Function Level of Independence: Independent with assistive device(s) Gait / Transfers Assistance Needed: ambulates with rollater about 23' ADL's / Homemaking Assistance Needed: cooking, cleaning Comments: Pt takes sponge baths and able to dress himself. Pt has iliostomy Communication Communication: HOH Dominant Hand: Right    Cognition  Cognition Arousal/Alertness: Awake/alert Behavior During Therapy: WFL for tasks assessed/performed Overall Cognitive Status: Within Functional Limits for tasks assessed     Extremity/Trunk Assessment Lower Extremity Assessment Lower Extremity Assessment: Generalized weakness Cervical / Trunk Assessment Cervical / Trunk Assessment: Kyphotic   Balance Balance Overall balance assessment: Needs assistance Sitting-balance support: Bilateral upper extremity supported Sitting balance-Leahy Scale: Good Standing balance support: Bilateral upper extremity supported Standing balance-Leahy Scale: Poor  End of Session PT - End of Session Equipment Utilized During Treatment: Gait belt Activity Tolerance: Patient limited by fatigue Patient left: in chair;with call bell/phone within reach;with family/visitor present Nurse Communication: Mobility status  GP     Kathya Wilz 08/16/2013, 1:50 PM  Antoine Poche, [place] DPT [phone]

## 2013-08-16 NOTE — Progress Notes (Signed)
Subjective: [name] is doing about the same this morning.  No complaints.  No chest pain, palpitations, shortness of breath.  Daughter-in-law states that he is only able to eat soft foods.   Objective: Vital signs in last 24 hours: Filed Vitals:   08/15/13 1636 08/15/13 2118 08/16/13 0151 08/16/13 0600  BP: 129/48 113/44 104/42 114/47  Pulse: 68 61 66 63  Temp: 98.5 F (36.9 C) 98.2 F (36.8 C) 99.6 F (37.6 C) 98.9 F (37.2 C)  TempSrc: Oral Oral Oral Oral  Resp: 16 18 18 18   Height: 5\' 8"  (1.727 m)     Weight: 170 lb (77.111 kg)   170 lb 8 oz (77.338 kg)  SpO2: 96% 99% 97% 97%   Weight change: -4 lb (-1.814 kg)  Intake/Output Summary (Last 24 hours) at 08/16/13 0923 Last data filed at 08/16/13 0345  Gross per 24 hour  Intake     25 ml  Output    250 ml  Net   -225 ml   PEX General: alert, cooperative, and in NAD HEENT: NCAT, sclerae anicteric Neck: supple, no lymphadenopathy, no JVD Lungs: clear to ascultation bilaterally, normal work of respiration, no wheezes, rales, ronchi Heart: regular rate and rhythm, no murmurs, gallops, or rubs Abdomen: soft, non-tender, non-distended, normal bowel sounds; extensive scarring of abdomen with functioning ileostomy in right lower quadrant, some erythema of skin lateral to stoma site Extremities: chronic appearing ulcer of left heel with mildly bloody discharge; 2+ DP/PT pulses bilaterally, no cyanosis, clubbing, or edema Neurologic: alert & oriented X3, cranial nerves II-XII intact, strength grossly intact, sensation intact to light touch  Lab Results: Basic Metabolic Panel:  Recent Labs Lab 08/15/13 0405 08/15/13 0425 08/15/13 2253 08/16/13 0315  NA 136*  --  133* 134*  K 3.5*  --  4.3 4.4  CL 96  --  95* 95*  CO2 22  --  22 21  GLUCOSE 121*  --  113* 102*  BUN 65*  --  69* 73*  CREATININE 4.09*  --  3.96* 4.28*  CALCIUM 9.7  --  9.0 9.5  MG  --  1.7  --   --    CBC:   Recent Labs Lab 08/15/13 0405  08/16/13 0315  WBC 24.3* 20.3*  NEUTROABS 21.8*  --   HGB 13.0 11.9*  HCT 38.4* 34.5*  MCV 95.8 93.8  PLT 173 142*   Cardiac Enzymes:   Recent Labs Lab 08/15/13 1627 08/15/13 2253 08/16/13 0315  TROPONINI <0.30 <0.30 <0.30   BNP:  Recent Labs Lab 08/15/13 1627  PROBNP 31605.0*   CBG:  Recent Labs Lab 08/15/13 1634 08/15/13 2113  GLUCAP 109* 115*   Thyroid Function Tests:  Recent Labs Lab 08/15/13 1700  TSH 0.295*   Coagulation:  Recent Labs Lab 08/15/13 0405 08/16/13 0315  LABPROT 29.9* 27.0*  INR 2.98* 2.61*   Urinalysis:  Recent Labs Lab 08/15/13 0551  COLORURINE YELLOW  LABSPEC 1.014  PHURINE 5.0  GLUCOSEU NEGATIVE  HGBUR NEGATIVE  BILIRUBINUR NEGATIVE  KETONESUR NEGATIVE  PROTEINUR NEGATIVE  UROBILINOGEN 0.2  NITRITE NEGATIVE  LEUKOCYTESUR NEGATIVE    Studies/Results: Dg Chest Port 1 View  08/15/2013   CLINICAL DATA:  Fever.  EXAM: PORTABLE CHEST - 1 VIEW  COMPARISON:  02/01/2012  FINDINGS: Dual-chamber left approach ICD/pacer, lead orientation stable from prior. Cardiomegaly, status post CABG. No edema or consolidation. No effusion or pneumothorax.  IMPRESSION: Negative for pneumonia.   Electronically Signed   By: Roderic Palau  Watts M.D.   On: 08/15/2013 05:02   Medications: I have reviewed the patient's current medications. Scheduled Meds: . ALPRAZolam  0.5 mg Oral QID  . amitriptyline  25 mg Oral QHS  . antiseptic oral rinse  15 mL Mouth Rinse BID  . calcitRIOL  0.5 mcg Oral Daily  . carvedilol  3.125 mg Oral BID WC  . donepezil  10 mg Oral QHS  . isosorbide dinitrate  10 mg Oral TID  . levothyroxine  100 mcg Oral QAC breakfast  . omega-3 acid ethyl esters  2 g Oral Daily  . pantoprazole  40 mg Oral Daily  . sodium bicarbonate  650 mg Oral BID  . sodium chloride  3 mL Intravenous Q12H  . sodium chloride  3 mL Intravenous Q12H  . tamsulosin  0.4 mg Oral PC supper  . warfarin  2.5 mg Oral ONCE-1800  . Warfarin - Pharmacist  Dosing Inpatient   Does not apply q1800   Continuous Infusions: . amiodarone (NEXTERONE PREMIX) 360 mg/200 mL dextrose 30 mg/hr (08/16/13 0646)   PRN Meds:.sodium chloride, acetaminophen, acetaminophen, nitroGLYCERIN, sodium chloride Assessment/Plan: #Defebrillator discharge- Defibrillator went off twice on 1/7 for ventricular tachycardia, twice on 1/8 fr atrial fibrillation with RVR per device interrogation.  WBC 24.3 on admission --> 20.3 today though patient afebrile and without tachycardia or tachypnea.  No antibiotics given this admission.  CXR, UA negative.  Left foot xray negative for calcaneus osteomyelitis.  Flu negative.  Troponins x 3 negative.  pBNP elevated at [address] though not volume overloaded on exam. ?aspiration PNA given daughter-in-law's report that he chokes on foods that are not soft or pureed. -cardiology consult, appreciate recs -amiodarone gtt through tomorrow -continue Coreg -blood cultures x 2 pending -repeat CXR pending -SLP consult for swallow eval and diet recs -wound care to evaluate left heel and erythematous area on right abdomen  -PT  #Acute on CKD4- Baseline Cr 2.5-3.  Cr on admission 4.09 --> 3.96 --> 4.28 today.  Patient on sodium bicarb at home.  -holding Lasix -continue sodium bicarb, calcitriol  -no IVFs given severe CHF -BMP in AM   #CHF, systolic- Echo in 4235 showed EF 20%.  pBNP [id number] though patient clinically euvolemic.  -continue Coreg, holding Lasix  -would likely benefit from adding ACEi but will not do this is setting of AKI  #HTN- BP low-normotensive -continue Coreg per above  #Atrial fibrillation- on Coumadin INR 2.98 --> 2.61 -Coumadin per pharmacy   #Hypothyroidism- TSH 3.12 in 10/2010, now 0.295.  On Synthroid 100 mcg daily at home. -continue Synthroid 100 mcg daily  -order free T4 and T3 -follow-up outpatient   Dispo: Disposition is deferred at this time, awaiting improvement of current medical problems.  Anticipated discharge  in approximately 2-3 day(s).   The patient does have a current PCP Ernestene Kiel, MD) and does need an [facility] hospital follow-up appointment after discharge.   .Services Needed at time of discharge: Y = Yes, Blank = No PT:   OT:   RN:   Equipment:   Other:     LOS: 1 day   Ivin Poot, MD 08/16/2013, 9:23 AM

## 2013-08-16 NOTE — Progress Notes (Signed)
Patient Name: [name] Date of Encounter: 08/16/2013     Principal Problem:   Defibrillator discharge Active Problems:   PROSTATE CANCER   HYPOTHYROIDISM   HYPERTENSION   CORONARY ARTERY DISEASE   Atrial fibrillation   Chronic systolic heart failure   PERIPHERAL VASCULAR DISEASE   AUTOMATIC IMPLANTABLE CARDIAC DEFIBRILLATOR SITU   CHRONIC KIDNEY DISEASE STAGE IV (SEVERE)   Long term current use of anticoagulant   Fever   Acute on chronic kidney failure    SUBJECTIVE  The patient feels well today.  He is not having any chills or fever.  He has not had any further shocks from his ICD.  He is on IV amiodarone drip.  CURRENT MEDS . ALPRAZolam  0.5 mg Oral QID  . amitriptyline  25 mg Oral QHS  . antiseptic oral rinse  15 mL Mouth Rinse BID  . calcitRIOL  0.5 mcg Oral Daily  . carvedilol  3.125 mg Oral BID WC  . donepezil  10 mg Oral QHS  . isosorbide dinitrate  10 mg Oral TID  . levothyroxine  100 mcg Oral QAC breakfast  . omega-3 acid ethyl esters  2 g Oral Daily  . pantoprazole  40 mg Oral Daily  . sodium bicarbonate  650 mg Oral BID  . sodium chloride  3 mL Intravenous Q12H  . sodium chloride  3 mL Intravenous Q12H  . tamsulosin  0.4 mg Oral PC supper  . warfarin  2.5 mg Oral ONCE-1800  . Warfarin - Pharmacist Dosing Inpatient   Does not apply q1800    OBJECTIVE  Filed Vitals:   08/15/13 1636 08/15/13 2118 08/16/13 0151 08/16/13 0600  BP: 129/48 113/44 104/42 114/47  Pulse: 68 61 66 63  Temp: 98.5 F (36.9 C) 98.2 F (36.8 C) 99.6 F (37.6 C) 98.9 F (37.2 C)  TempSrc: Oral Oral Oral Oral  Resp: 16 18 18 18   Height: 5\' 8"  (1.727 m)     Weight: 170 lb (77.111 kg)   170 lb 8 oz (77.338 kg)  SpO2: 96% 99% 97% 97%    Intake/Output Summary (Last 24 hours) at 08/16/13 1149 Last data filed at 08/16/13 0345  Gross per 24 hour  Intake     25 ml  Output    250 ml  Net   -225 ml   Filed Weights   08/15/13 0350 08/15/13 1636 08/16/13 0600  Weight:  174 lb (78.926 kg) 170 lb (77.111 kg) 170 lb 8 oz (77.338 kg)    PHYSICAL EXAM  General: Pleasant, NAD. Neuro: Alert and oriented X 3. Moves all extremities spontaneously. Psych: Normal affect. HEENT:  Normal  Neck: Supple without bruits or JVD. Lungs:  Resp regular and unlabored, CTA. Heart: RRR no s3, s4, or murmurs. Abdomen: Soft, non-tender.  He has extensive scarring of his abdomen and he has a functioning ileostomy in the right lower quadrant of the abdomen. Extremities: No phlebitis  Accessory Clinical Findings  CBC  Recent Labs  08/15/13 0405 08/16/13 0315  WBC 24.3* 20.3*  NEUTROABS 21.8*  --   HGB 13.0 11.9*  HCT 38.4* 34.5*  MCV 95.8 93.8  PLT 173 989*   Basic Metabolic Panel  Recent Labs  08/15/13 0405 08/15/13 0425 08/15/13 2253 08/16/13 0315  NA 136*  --  133* 134*  K 3.5*  --  4.3 4.4  CL 96  --  95* 95*  CO2 22  --  22 21  GLUCOSE 121*  --  113*  102*  BUN 65*  --  69* 73*  CREATININE 4.09*  --  3.96* 4.28*  CALCIUM 9.7  --  9.0 9.5  MG  --  1.7  --   --    Liver Function Tests No results found for this basename: AST, ALT, ALKPHOS, BILITOT, PROT, ALBUMIN,  in the last 72 hours No results found for this basename: LIPASE, AMYLASE,  in the last 72 hours Cardiac Enzymes  Recent Labs  08/15/13 2253 08/16/13 0315 08/16/13 1030  TROPONINI <0.30 <0.30 <0.30   BNP No components found with this basename: POCBNP,  D-Dimer No results found for this basename: DDIMER,  in the last 72 hours Hemoglobin A1C No results found for this basename: HGBA1C,  in the last 72 hours Fasting Lipid Panel No results found for this basename: CHOL, HDL, LDLCALC, TRIG, CHOLHDL, LDLDIRECT,  in the last 72 hours Thyroid Function Tests  Recent Labs  08/15/13 1700  TSH 0.295*    TELE  Telemetry shows normal sinus rhythm  ECG    Radiology/Studies  Dg Chest Port 1 View  08/15/2013   CLINICAL DATA:  Fever.  EXAM: PORTABLE CHEST - 1 VIEW  COMPARISON:   02/01/2012  FINDINGS: Dual-chamber left approach ICD/pacer, lead orientation stable from prior. Cardiomegaly, status post CABG. No edema or consolidation. No effusion or pneumothorax.  IMPRESSION: Negative for pneumonia.   Electronically Signed   By: Jorje Guild M.D.   On: 08/15/2013 05:02    ASSESSMENT AND PLAN 1.Recurrent ICD shocks  2. VT  3. ICM, with chronic systolic heart failure  4. Chronic renal insufficiency  5. Elevated WBC with hard shaking chill, worrisome for pnuemonia or influenza or transient bacteremia.  No progressive symptoms and white count is down slightly  6. Indwelling ostomy  7. Foot ulcer, chronic, draining  Plan: Continue IV amiodarone for another 24 hours then switch to oral amiodarone tomorrow.  Signed, Darlin Coco MD

## 2013-08-16 NOTE — Progress Notes (Signed)
ANTICOAGULATION CONSULT NOTE - Initial Consult  Pharmacy Consult for Coumadin Indication: atrial fibrillation  Allergies  Allergen Reactions  . Ciprofloxacin Hcl     Rash, itching  . Amoxicillin     REACTION: rash  . Clindamycin   . Penicillins     REACTION: rash    Patient Measurements: Height: 5\' 8"  (172.7 cm) Weight: 170 lb 8 oz (77.338 kg) (scale A) IBW/kg (Calculated) : 68.4  Vital Signs: Temp: 98.9 F (37.2 C) (01/10 0600) Temp src: Oral (01/10 0600) BP: 114/47 mmHg (01/10 0600) Pulse Rate: 63 (01/10 0600)  Labs:  Recent Labs  08/15/13 0405  08/15/13 1627 08/15/13 2253 08/16/13 0315  HGB 13.0  --   --   --  11.9*  HCT 38.4*  --   --   --  34.5*  PLT 173  --   --   --  142*  LABPROT 29.9*  --   --   --  27.0*  INR 2.98*  --   --   --  2.61*  CREATININE 4.09*  --   --  3.96* 4.28*  TROPONINI  --   < > <0.30 <0.30 <0.30  < > = values in this interval not displayed.  Estimated Creatinine Clearance: 12 ml/min (by C-G formula based on Cr of 4.28).   Medical History: Past Medical History  Diagnosis Date  . Hypertension   . CAD (coronary artery disease)     remote CABG  . Ischemic cardiomyopathy   . Chronic systolic heart failure   . Atrial fibrillation   . Atrial flutter   . Peripheral vascular disease   . Hypercholesteremia   . Hypothyroid   . Colitis, ischemic     has ileostomy in place x 14 years  . BPH with urinary obstruction   . DJD (degenerative joint disease)   . Anxiety   . Anemia   . PVD (peripheral vascular disease)   . 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 High risk medication use     on amiodarone therapy  . Automatic implantable cardioverter-defibrillator in situ   . CHF (congestive heart failure)   . Myocardial infarction 1998  . GERD (gastroesophageal reflux disease)   . Chronic kidney disease (CKD), stage IV (severe)   . Prostate cancer 2003    tx'd w/radiation"  . Skin cancer     "basal and squamous;  arms, face" (08/15/2013)    Medications:  See electronic med rec  Assessment: 80 y.o. male presents with ICD firing and fever. On coumadin PTA for afib. Baseline INR 2.98 (upper end of therapeutic). CBC wnl, no bleeding noted.  Home dose 5 mg M/W/F and 2.5mg  T/T/S/S Noted addition of amio gtt (pt on 100mg  po amio PTA)  INR this AM 01/10 - 2.61  Goal of Therapy:  INR 2-3 Monitor platelets by anticoagulation protocol: Yes   Plan:  1. Daily INR 2. Coumadin 2.5mg  tonight x 1   Zanasia Hickson B. Leitha Schuller, PharmD Clinical Pharmacist - Resident Pager: [phone] Phone: [phone] 08/16/2013 8:05 AM

## 2013-08-16 NOTE — Evaluation (Signed)
Clinical/Bedside Swallow Evaluation Patient Details  Name: [name] MRN: [id number] Date of Birth: 03-23-1925  Today's Date: 08/16/2013 Time: 1450-1500 SLP Time Calculation (min): 10 min  Past Medical History:  Past Medical History  Diagnosis Date  . Hypertension   . CAD (coronary artery disease)     remote CABG  . Ischemic cardiomyopathy   . Chronic systolic heart failure   . Atrial fibrillation   . Atrial flutter   . Peripheral vascular disease   . Hypercholesteremia   . Hypothyroid   . Colitis, ischemic     has ileostomy in place x 14 years  . BPH with urinary obstruction   . DJD (degenerative joint disease)   . Anxiety   . Anemia   . PVD (peripheral vascular disease)   . 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 High risk medication use     on amiodarone therapy  . Automatic implantable cardioverter-defibrillator in situ   . CHF (congestive heart failure)   . Myocardial infarction 1998  . GERD (gastroesophageal reflux disease)   . Chronic kidney disease (CKD), stage IV (severe)   . Prostate cancer 2003    tx'd w/radiation"  . Skin cancer     "basal and squamous; arms, face" (08/15/2013)   Past Surgical History:  Past Surgical History  Procedure Laterality Date  . Total colectomy  1998    ischemic colitis  . Doppler echocardiography  2006, 2007  . Cardiac defibrillator placement  2003; 2011    BSX dual chamber ICD implanted by Dr Lovena Le 2003 with gen change 2011  . Coronary artery bypass graft  1998    CABG X8  . Colon surgery    . Permanent ileostomy  1998  . Incision and drainage  1998-1999    "had staph infection in stomach incision; had to have several ORs during his 3+ month hospitalization"  . Cataract extraction w/ intraocular lens  implant, bilateral Bilateral 2000's  . Skin cancer excision Right     arm  . Cardiac catheterization  1998; 10/26/1999   HPI:  The patient is 80 year old male with past medical history of coronary  disease (s/p CABG - 1998), ischemic cardiomyopathy (s/p BSX dual chamber ICD implanted 2003 with generator change 2011), atrial fibrillation (on Warfarin and low dose Amiodarone), and chronic renal insufficiency (baseline creat 2. 5 to 3.2), hypertension, hyperlipidemia, hypothyroidism, peripheral vascular disease, history of prostate cancer, who presents with defibrillator firing and fever. At baseline, patient is living with his wife at home. He uses walker to walk around in his house. He was in his usual state of health until two days ago when his ICD fired. Patient reports that his ICD fired twice  two days ago when he was "standing and thinking". He did not have chest pain, shortness of breath or palpitation.  He called his cardiologist who advised him that if it fired again, he should come to the ED. His ICD fired twice last night. Therefore he came to ED for evaluation. Device interrogation in ED reveals appropriate therapy for VT with failed ATP followed by ICD shocks which restored SR.  He has also had 2 episodes of atrial fib with RVR for which he received inappropriate therapy (ATP) per Dr. Tanna Furry note.   Assessment / Plan / Recommendation Clinical Impression  Pt presents with functional oropharyngeal swallow with adequate mastication, swift swallow trigger, and no s/s of aspiration with PO intake.  Daughter present for assessment - reports  no issues with swallowing.  Continue current diet - no f/u needed.    Aspiration Risk  Mild - baseline with hx esophageal issues   Diet Recommendation Dysphagia 3 (Mechanical Soft);Thin liquid - renal and cardiac restrictions  Liquid Administration via: Straw;Cup Medication Administration: Whole meds with liquid Supervision: Patient able to self feed    Other  Recommendations Oral Care Recommendations: Oral care BID   Follow Up Recommendations  None           SLP Swallow Goals  n/a   Swallow Study Prior Functional Status       General  Date of Onset: 08/16/13 HPI: The patient is 80 year old male with past medical history of coronary disease (s/p CABG - 1998), ischemic cardiomyopathy (s/p BSX dual chamber ICD implanted 2003 with generator change 2011), atrial fibrillation (on Warfarin and low dose Amiodarone), and chronic renal insufficiency (baseline creat 2. 5 to 3.2), hypertension, hyperlipidemia, hypothyroidism, peripheral vascular disease, history of prostate cancer, who presents with defibrillator firing and fever. At baseline, patient is living with his wife at home. He uses walker to walk around in his house. He was in his usual state of health until two days ago when his ICD fired. Patient reports that his ICD fired twice  two days ago when he was "standing and thinking". He did not have chest pain, shortness of breath or palpitation.  He called his cardiologist who advised him that if it fired again, he should come to the ED. His ICD fired twice last night. Therefore he came to ED for evaluation. Device interrogation in ED reveals appropriate therapy for VT with failed ATP followed by ICD shocks which restored SR.  He has also had 2 episodes of atrial fib with RVR for which he received inappropriate therapy (ATP) per Dr. Tanna Furry note. Type of Study: Bedside swallow evaluation Previous Swallow Assessment: nnone Diet Prior to this Study: Dysphagia 3 (soft);Thin liquids Temperature Spikes Noted: No Respiratory Status: Room air History of Recent Intubation: No Behavior/Cognition: Alert;Cooperative;Pleasant mood Oral Cavity - Dentition: Dentures, top;Dentures, bottom Self-Feeding Abilities: Able to feed self Patient Positioning: Upright in bed Baseline Vocal Quality: Clear Volitional Cough: Strong Volitional Swallow: Able to elicit    Oral/Motor/Sensory Function Overall Oral Motor/Sensory Function: Appears within functional limits for tasks assessed   Ice Chips Ice chips: Within functional limits Presentation: Self Fed    Thin Liquid Thin Liquid: Within functional limits Presentation: Cup;Straw    Nectar Thick Nectar Thick Liquid: Not tested   Honey Thick Honey Thick Liquid: Not tested   Puree Puree: Within functional limits Presentation: Self Fed;Spoon   Solid   Philomina Leon L. [place], [place] CCC/SLP Pager [phone]     Solid: Within functional limits Presentation: Self Fed       Juan Quam Laurice 08/16/2013,3:01 PM

## 2013-08-16 NOTE — H&P (Signed)
  Date: 08/16/2013  Patient name: JDYN PARKERSON  Medical record number: [id number]  Date of birth: 1925-07-02   I have seen and evaluated Jeri Lager and discussed their care with the Residency Team.  Briefly, Mr. Yu is an 80yo man who presents due to ICD discharge.  He reports being in his normal state of health two days prior to admission when his ICD fired twice when he was standing.  He did not have any symptoms at that time.  He reported the firing to his Cardiologist who suggested if it should fire again to be evaluated in the ED.  It did fire again twice on the evening of admission.  On interrogation, the first set of firing was due to [place] and the second due to Afib.  His family reported a temperature of 101F at home, but he has been afebrile here.  He has good home support from family.  He denies any symptom of acute infection, nausea, vomiting, decreased PO intake, fever, chills, cough, SOB, chest pain.  He does have a chronic left heal ulcer which has been there for about 2 weeks and has been draining.  He has an ostomy from previous surgery and some skin irritation at the site, but no change in stool.   Cr is 4.09 which is above baseline of about 2.5.  WBC is 24.3.  Initial troponin 0.4. UA unremarkable.  CXR showed ICD, but no acute pulmonary changes.   Assessment and Plan: I have seen and evaluated the patient as outlined above. I agree with the formulated Assessment and Plan as detailed in the residents' admission note, with the following changes:   1. Defib firing, unclear reason: Due to fever at home, elevated WBC and worsening Cr I am concerned for an occult infection.  BC have been sent, flu PCR has been sent.  Will get foot xray of the left foot ulcer to eval for osteomyelitis.  UA is normal, which likely rules out a urine infection.  CXR is relatively normal, will repeat CXR after therapy.  Cardiology has been consulted.  He will be placed on an amiodarone drip and we are  continuing his coreg and ISDN. Check CE X 3.   2. AKI on CKD:  Continue home bicarb, will plan to hold diuretics in the setting of his AKI.  Continue home bicarbonate.  Given his severity of CHF, would not start fluids at this time, as he will be getting some minimal fluids with drip.  Would eval BMP daily if not BID if needed.  Strict I/Os.   3. Chronic systolic HF: No signs of volume overload.  EF of 20% per previous TTE.  Start back Cored.  Holding lasix as above with triggers to restart being improvement in renal function and volume overload.    Other issues per resident note.   Sid Falcon, MD 1/10/201510:39 AM

## 2013-08-17 LAB — PROTIME-INR
INR: 2.61 — AB (ref 0.00–1.49)
Prothrombin Time: 27 seconds — ABNORMAL HIGH (ref 11.6–15.2)

## 2013-08-17 LAB — CBC WITH DIFFERENTIAL/PLATELET
BASOS ABS: 0 10*3/uL (ref 0.0–0.1)
BASOS PCT: 0 % (ref 0–1)
EOS ABS: 0.2 10*3/uL (ref 0.0–0.7)
Eosinophils Relative: 2 % (ref 0–5)
HEMATOCRIT: 32.6 % — AB (ref 39.0–52.0)
Hemoglobin: 11.1 g/dL — ABNORMAL LOW (ref 13.0–17.0)
Lymphocytes Relative: 12 % (ref 12–46)
Lymphs Abs: 1.4 10*3/uL (ref 0.7–4.0)
MCH: 31.8 pg (ref 26.0–34.0)
MCHC: 34 g/dL (ref 30.0–36.0)
MCV: 93.4 fL (ref 78.0–100.0)
MONO ABS: 1 10*3/uL (ref 0.1–1.0)
Monocytes Relative: 8 % (ref 3–12)
Neutro Abs: 9.6 10*3/uL — ABNORMAL HIGH (ref 1.7–7.7)
Neutrophils Relative %: 78 % — ABNORMAL HIGH (ref 43–77)
PLATELETS: 131 10*3/uL — AB (ref 150–400)
RBC: 3.49 MIL/uL — ABNORMAL LOW (ref 4.22–5.81)
RDW: 14.6 % (ref 11.5–15.5)
WBC: 12.2 10*3/uL — AB (ref 4.0–10.5)

## 2013-08-17 LAB — BASIC METABOLIC PANEL
BUN: 78 mg/dL — AB (ref 6–23)
CALCIUM: 9.3 mg/dL (ref 8.4–10.5)
CHLORIDE: 92 meq/L — AB (ref 96–112)
CO2: 21 meq/L (ref 19–32)
CREATININE: 4.39 mg/dL — AB (ref 0.50–1.35)
GFR calc Af Amer: 13 mL/min — ABNORMAL LOW (ref >90)
GFR calc non Af Amer: 11 mL/min — ABNORMAL LOW (ref >90)
GLUCOSE: 114 mg/dL — AB (ref 70–99)
Potassium: 4.1 mEq/L (ref 3.7–5.3)
Sodium: 130 mEq/L — ABNORMAL LOW (ref 137–147)

## 2013-08-17 LAB — TROPONIN I
Troponin I: <0.3 ng/mL (ref <0.30)
Troponin I: <0.3 ng/mL (ref <0.30)

## 2013-08-17 MED ORDER — SODIUM CHLORIDE 0.9 % IV SOLN
INTRAVENOUS | Status: AC
  Administered 2013-08-17: 12:00:00 via INTRAVENOUS

## 2013-08-17 MED ORDER — MUPIROCIN CALCIUM 2 % EX CREA
TOPICAL_CREAM | Freq: Two times a day (BID) | CUTANEOUS | Status: DC
  Administered 2013-08-17 – 2013-08-19 (×5): via TOPICAL
  Filled 2013-08-17 (×2): qty 15

## 2013-08-17 MED ORDER — AMIODARONE HCL 200 MG PO TABS
200.0000 mg | ORAL_TABLET | Freq: Two times a day (BID) | ORAL | Status: DC
  Administered 2013-08-17 – 2013-08-19 (×5): 200 mg via ORAL
  Filled 2013-08-17 (×6): qty 1

## 2013-08-17 MED ORDER — WARFARIN SODIUM 2.5 MG PO TABS
2.5000 mg | ORAL_TABLET | Freq: Once | ORAL | Status: AC
  Administered 2013-08-17: 17:00:00 2.5 mg via ORAL
  Filled 2013-08-17: qty 1

## 2013-08-17 NOTE — Progress Notes (Signed)
Pharmacist Heart Failure Core Measure Documentation  Assessment: [name] has an EF documented as 20% by ECHO in 2007 per internal medicine.  Rationale: Heart failure patients with left ventricular systolic dysfunction (LVSD) and an EF < 40% should be prescribed an angiotensin converting enzyme inhibitor (ACEI) or angiotensin receptor blocker (ARB) at discharge unless a contraindication is documented in the medical record.  This patient is not currently on an ACEI or ARB for HF.  This note is being placed in the record in order to provide documentation that a contraindication to the use of these agents is present for this encounter.  ACE Inhibitor or Angiotensin Receptor Blocker is contraindicated (specify all that apply)  []   ACEI allergy AND ARB allergy []   Angioedema []   Moderate or severe aortic stenosis []   Hyperkalemia []   Hypotension []   Renal artery stenosis [x]   Worsening renal function, preexisting renal disease or dysfunction   [name] C. [name], PharmD Clinical Pharmacist-Resident Pager: [phone] Pharmacy: [phone] 08/17/2013 10:43 AM

## 2013-08-17 NOTE — Progress Notes (Signed)
ANTICOAGULATION CONSULT NOTE - Initial Consult  Pharmacy Consult for Coumadin Indication: atrial fibrillation  Allergies  Allergen Reactions  . Ciprofloxacin Hcl     Rash, itching  . Amoxicillin     REACTION: rash  . Clindamycin   . Penicillins     REACTION: rash    Patient Measurements: Height: 5\' 8"  (172.7 cm) Weight: 171 lb 1.2 oz (77.6 kg) IBW/kg (Calculated) : 68.4  Vital Signs: Temp: 97.5 F (36.4 C) (01/11 0735) Temp src: Oral (01/11 0735) BP: 124/51 mmHg (01/11 0735) Pulse Rate: 59 (01/11 0735)  Labs:  Recent Labs  08/15/13 0405  08/15/13 2253 08/16/13 0315  08/16/13 1655 08/17/13 0004 08/17/13 0348  HGB 13.0  --   --  11.9*  --   --   --  11.1*  HCT 38.4*  --   --  34.5*  --   --   --  32.6*  PLT 173  --   --  142*  --   --   --  131*  LABPROT 29.9*  --   --  27.0*  --   --   --  27.0*  INR 2.98*  --   --  2.61*  --   --   --  2.61*  CREATININE 4.09*  --  3.96* 4.28*  --   --   --  4.39*  TROPONINI  --   < > <0.30 <0.30  < > <0.30 <0.30 <0.30  < > = values in this interval not displayed.  Estimated Creatinine Clearance: 11.7 ml/min (by C-G formula based on Cr of 4.39).   Medical History: Past Medical History  Diagnosis Date  . Hypertension   . CAD (coronary artery disease)     remote CABG  . Ischemic cardiomyopathy   . Chronic systolic heart failure   . Atrial fibrillation   . Atrial flutter   . Peripheral vascular disease   . Hypercholesteremia   . Hypothyroid   . Colitis, ischemic     has ileostomy in place x 14 years  . BPH with urinary obstruction   . DJD (degenerative joint disease)   . Anxiety   . Anemia   . PVD (peripheral vascular disease)   . 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 High risk medication use     on amiodarone therapy  . Automatic implantable cardioverter-defibrillator in situ   . CHF (congestive heart failure)   . Myocardial infarction 1998  . GERD (gastroesophageal reflux disease)   . Chronic  kidney disease (CKD), stage IV (severe)   . Prostate cancer 2003    tx'd w/radiation"  . Skin cancer     "basal and squamous; arms, face" (08/15/2013)    Medications:  See electronic med rec  Assessment: 80 y.o. male presents with ICD firing and fever. On coumadin PTA for afib. Baseline INR 2.98 (upper end of therapeutic). CBC wnl, no bleeding noted.   Home dose 5 mg M/W/F and 2.5mg  T/T/S/S Noted addition of amio gtt (pt on 100mg  po amio PTA)  INR this AM 01/11 - 2.61  Goal of Therapy:  INR 2-3 Monitor platelets by anticoagulation protocol: Yes   Plan:  Coumadin 2.5mg  x 1 tonight Daily INR  Rafael Quesada B. Leitha Schuller, PharmD Clinical Pharmacist - Resident Pager: [phone] Phone: [phone] 08/17/2013 8:40 AM

## 2013-08-17 NOTE — Progress Notes (Signed)
   CARE MANAGEMENT NOTE 08/17/2013  Patient:  [name], [name]   Account Number:  [id number]  Date Initiated:  08/17/2013  Documentation initiated by:  [facility]  Subjective/Objective Assessment:   adm: Defibrillator discharge     Action/Plan:   discharge planning   Anticipated DC Date:  08/18/2013   Anticipated DC Plan:  [place]  CM consult      [facility] Choice  HOME HEALTH   Choice offered to / List presented to:             Status of service:  In process, will continue to follow Medicare Important Message given?   (If response is "NO", the following Medicare IM given date fields will be blank) Date Medicare IM given:   Date Additional Medicare IM given:    Discharge Disposition:    Per UR Regulation:    If discussed at Long Length of Stay Meetings, dates discussed:    Comments:  08/17/13 16:35 CM spoke with step-daughter, [name] [phone], and pt in room to offer choice as pt's daughter [name] [phone] expressed to Dr. Aundra Dubin The family would prefer pt to go home with [facility] not SNF.  All family members are in agreement that pt should go home.  [name] works for [employer] as a Quarry manager though she also expressed interest in [facility].  She states she will talk it over with family members and make the decision tonight or tomorrow.  CM spoke with [name] and she is in agreement with Tonya. CM will continue to follow for choice and disposition needs of pt.  [name], BSN, CM [phone].

## 2013-08-17 NOTE — Progress Notes (Addendum)
Subjective: Denies chest pain, sob, ICD firing, feeling well wants to go home.  Daughter prefers H/H instead of SNF.     Objective: Vital signs in last 24 hours: Filed Vitals:   08/16/13 2215 08/16/13 2217 08/17/13 0620 08/17/13 0735  BP: 126/50 126/50 128/58 124/51  Pulse: 60 60 66 59  Temp: 98.1 F (36.7 C) 98.1 F (36.7 C)  97.5 F (36.4 C)  TempSrc: Oral Oral  Oral  Resp: 20 20  20   Height:      Weight:    171 lb 1.2 oz (77.6 kg)  SpO2: 96% 96%  94%   Weight change:   Intake/Output Summary (Last 24 hours) at 08/17/13 0943 Last data filed at 08/17/13 [id number]  Gross per 24 hour  Intake    492 ml  Output    700 ml  Net   -208 ml   Physical exam General: alert, cooperative, and in NAD HEENT: NCAT Lungs: clear to ascultation bilaterally Heart: regular rate and rhythm, no murmurs, gallops, or rubs Abdomen: soft, ntnd, normal bowel sounds; scarring of abdomen with functioning ileostomy in right lower quadrant, some erythema of skin lateral to stoma site Extremities: chronic appearing ulcer of left heel with serosanguinous discharge; no edema, left lower leg with erythema and abrasion, left heel with ulceration, right great toe with mild abrasion Skin: buttocks with moisture assoc. Skin breakdown  Neurologic: alert & oriented X3, cranial nerves II-XII intact, physical deconditioning   Lab Results: Basic Metabolic Panel:  Recent Labs Lab 08/15/13 0405 08/15/13 0425  08/16/13 0315 08/17/13 0348  NA 136*  --   < > 134* 130*  K 3.5*  --   < > 4.4 4.1  CL 96  --   < > 95* 92*  CO2 22  --   < > 21 21  GLUCOSE 121*  --   < > 102* 114*  BUN 65*  --   < > 73* 78*  CREATININE 4.09*  --   < > 4.28* 4.39*  CALCIUM 9.7  --   < > 9.5 9.3  MG  --  1.7  --   --   --   < > = values in this interval not displayed. CBC:   Recent Labs Lab 08/15/13 0405 08/16/13 0315 08/17/13 0348  WBC 24.3* 20.3* 12.2*  NEUTROABS 21.8*  --  9.6*  HGB 13.0 11.9* 11.1*  HCT 38.4* 34.5* 32.6*   MCV 95.8 93.8 93.4  PLT 173 142* 131*   Cardiac Enzymes:   Recent Labs Lab 08/16/13 1655 08/17/13 0004 08/17/13 0348  TROPONINI <0.30 <0.30 <0.30   BNP:  Recent Labs Lab 08/15/13 1627  PROBNP 31605.0*   CBG:  Recent Labs Lab 08/15/13 1634 08/15/13 2113  GLUCAP 109* 115*   Thyroid Function Tests:  Recent Labs Lab 08/15/13 1700 08/16/13 1030  TSH 0.295*  --   FREET4  --  1.71   Coagulation:  Recent Labs Lab 08/15/13 0405 08/16/13 0315 08/17/13 0348  LABPROT 29.9* 27.0* 27.0*  INR 2.98* 2.61* 2.61*   Urinalysis:  Recent Labs Lab 08/15/13 0551  COLORURINE YELLOW  LABSPEC 1.014  PHURINE 5.0  GLUCOSEU NEGATIVE  HGBUR NEGATIVE  BILIRUBINUR NEGATIVE  KETONESUR NEGATIVE  PROTEINUR NEGATIVE  UROBILINOGEN 0.2  NITRITE NEGATIVE  LEUKOCYTESUR NEGATIVE    Studies/Results: X-ray Chest Pa And Lateral   08/16/2013   CLINICAL DATA:  Fever  EXAM: CHEST  2 VIEW  COMPARISON:  08/15/2013  FINDINGS: Cardiomediastinal silhouette is stable. Dual  lead cardiac pacemaker is unchanged is position. Osteopenia and mild degenerative changes thoracic spine. Atherosclerotic calcifications of thoracic aorta. Status post median sternotomy. There is streaky left base retrocardiac atelectasis or infiltrate. No pulmonary edema.  IMPRESSION: Streaky left base retrocardiac atelectasis or infiltrate. No pulmonary edema.   Electronically Signed   By: Lahoma Crocker M.D.   On: 08/16/2013 12:50   Dg Foot Complete Left  08/16/2013   CLINICAL DATA:  Left heel ulcer, concern for osteomyelitis  EXAM: LEFT FOOT - COMPLETE 3+ VIEW  COMPARISON:  09/08/2005  FINDINGS: Degenerative changes involving the midfoot with abduction at the tarsometatarsal joints, new from 2007. Associated collapse/flattening of the midfoot on the lateral view. This appearance favors Charcot arthropathy.  Reportedly, the patient has a heel ulcer, although this is not radiographically evident. However, there is no cortical  regularity involving the calcaneus to suggest osteomyelitis.  IMPRESSION: No radiographic findings to suggest osteomyelitis of the calcaneus.  Suspected Charcot arthropathy involving the midfoot.   Electronically Signed   By: Julian Hy M.D.   On: 08/16/2013 13:20   Medications: I have reviewed the patient's current medications. Scheduled Meds: . ALPRAZolam  0.5 mg Oral QID  . amitriptyline  25 mg Oral QHS  . antiseptic oral rinse  15 mL Mouth Rinse BID  . calcitRIOL  0.5 mcg Oral Daily  . carvedilol  3.125 mg Oral BID WC  . donepezil  10 mg Oral QHS  . isosorbide dinitrate  10 mg Oral TID  . levothyroxine  100 mcg Oral QAC breakfast  . mupirocin cream   Topical BID  . omega-3 acid ethyl esters  2 g Oral Daily  . pantoprazole  40 mg Oral Daily  . sodium bicarbonate  650 mg Oral BID  . sodium chloride  3 mL Intravenous Q12H  . sodium chloride  3 mL Intravenous Q12H  . tamsulosin  0.4 mg Oral PC supper  . warfarin  2.5 mg Oral ONCE-1800  . Warfarin - Pharmacist Dosing Inpatient   Does not apply q1800   Continuous Infusions: . amiodarone (NEXTERONE PREMIX) 360 mg/200 mL dextrose 30 mg/hr (08/16/13 0646)   PRN Meds:.sodium chloride, acetaminophen, acetaminophen, nitroGLYCERIN, sodium chloride Assessment/Plan: 80 y.o with significant PMH presented after defibillator firing x 4, fever at home (afebrile this admission), WBC 24.3, found to be in VT with 2 episodes of Atrial fibrillation with RVR.    #Defebrillator firing, resolved  -Pt was recently in VT and atrial fibrillation with RVR per device interrogation.  ? Etiology though may be due to infection since patient was febrile prior to admission but CXR, left foot xray neg.  WBC trending down 24.3>12.2 w/o Abx. Afebrile.  Wounds states wound ostomy site looks good.  Flu negative.  Blood cultures pending NTD, Urine cultures negative .   -Cards following with Amio gtt x 48 hours then transitioned to oral Amio 200 mg bid today. Cards  following.   -trops neg -f/u with cardiology outpatient.   -cards states d/c could be tomorrow   #SIRS resolving  -WBC trending dow, afebrile  -f/u cultures  #Acute on CKD4 -Baseline Cr 2.5-3.  Cr on admission 4.09>3.96 >4.28>4.39.  Patient on sodium bicarb at home. Continue.   -holding Lasix -continue calcitriol  -will try NS 75 cc/hr x 5 hours, repeat BMET in am   #CHF, systolic -Echo in 0263 showed EF 20%.  pBNP [id number] though patient clinically euvolemic.  -continue Coreg 3.125 mg bid, holding Lasix  -would likely benefit from adding  ACEi but will not do this is setting of AKI  #HTN -BP low-normotensive -continue Coreg per above -Monitor VS   #Atrial fibrillation -on Coumadin INR 2.98 > 2.61>2.61 -Coumadin per pharmacy  -INR daily   #Hypothyroidism - TSH 0.295.  On Synthroid 100 mcg daily at home. T4 wnl 1.71, T3 34.2 (low) -continue Synthroid 100 mcg daily  -follow-up outpatient PCP Dr. Laqueta Due in [place]  -repeat thyroid studies outpatient  #Left Lower extremity redness  -denies pain -will try topical Bactroban  #DVT px  -Coumadin   #F/E/N -NSL  -BMET daily  -mechanical soft diet    Dispo: Disposition is deferred at this time, awaiting improvement of current medical problems.  Anticipated discharge in approximately 1-2 day(s).   The patient does have a current PCP Ernestene Kiel, MD) and does need an [facility] hospital follow-up appointment after discharge.   .Services Needed at time of discharge: Y = Yes, Blank = No PT: H/H  OT:   RN:   Equipment:   Other:     LOS: 2 days   Cresenciano Genre, MD [phone] 08/17/2013, 9:43 AM

## 2013-08-17 NOTE — Consult Note (Addendum)
WOC wound consult note Reason for Consult: Consult requested for ostomy and several wounds.  Daughter at bedside is able to answer questions regarding ostomy care and wounds.  Pt received ostomy in 1998 and was previously independent and knowledgeable regarding pouching routines; now his family members perform pouch changes and emptying.  Daughter is able to apply pouch and is well-informed regarding product use and troubleshooting.  Pt wants to use his own supplies from home since [facility] does not have the pouch he prefers in the formulary.  WOC ostomy consult note Stoma type/location:  Ileostomy to RLQ. Requested by primary team to remove pouch and assess stoma.   Stomal assessment/size: Stoma red and viable, 1 inch, slightly above skin level.  2 small red vesicles located on posterior area of stoma, each .1X.1cm, 100% red. These are located in the 6:00 o'clock area of the stoma. Pt denies pain at these sites and appearance is not consistent with pyodermia.  Peristomal assessment: Skin intact surrounding stoma underneath wafer.  Partial thickness abrasion to right outer abd; approx 2X2X.1cm.  Appearance consistent with medical adhesive removal skin injury. (MARSI) Pt previously had medipore adhesive tape to this location. Treatment options for stomal/peristomal skin: Family is using stoma powder and paste to protect skin, then tape.  Discussed different options with them and gave them free sample of pink tape, which is more gentle to the skin. Output 100cc liquid brown stool Ostomy pouching: Current pouch leaking underneath the barrier.Demonstrated use of barrier ring instead of paste to daughter. Information given regarding ordering pink tape and barrier rings from [facility] if these products are preferred after discharge. Applied barrier ring and 2 piece pouch from patient's supplies as requested.  Applied stoma powder to protect skin.  MARSI area is pink and dry; left open to air to promote healing.   Applied pink tape around pouch in a window-pane fashion to secure to edges and avoid further skin injury.  Extra supplies in bag at patient's bedside since they prefer to use their own. Pt also has an ostomy belt according to daughter, which is not in use at this time but I encouraged her to apply this item to decrease the chance of further leakage if it has been successful in the past for this problem.  Wound type: Midline abd with extensive healed scar from previous full thickness wound which required mesh and a skin graft for closure.  No open wound or drainage to scar tissue area.    Abd folds between groin area are red and macerated in partial thickness patchy areas; daughter states pt has chronic problem with yeast.   Requested to assess right great toe and right heel.  Both have white dry intact callous areas without open wounds or drainage, no topical treatment needed at this time.  Pressure Ulcer POA: Yes to left heel Left heel with stage 2 wound; daughter states this has been there "awhile." 1X.5X.2cm, 100% red and moist.  Mod amt yellow drainage, no odor.  Sacrum with fissure to gluteal cleft; .2X.2X.2cm; pink and moist.  No odor, small amt yellow drainage.  Appearance consistent with constant moisture.    Left calf with dry intact scab from previous abrasion; 2.5X.1cm.  No odor, drainage, or fluctuance.  Generalized edema and edema surrounding entire left calf; appearance consistent with cellulitis.  Discussed with primary team who assessed wounds and ostomy at bedside during the consult; pt may require antibiotics if areas does not show improvement in the next few days.  Plan:  Bactroban to left scabbed area to leg.  Foam dressing to left heel and gluteal fold to promote healing.  Antifungal powder to skin folds of abd.  Please re-consult if further assistance is needed.  Thank-you,  Julien Girt MSN, [place], [place], [place], [place]

## 2013-08-17 NOTE — Progress Notes (Signed)
Patient Name: [name] Date of Encounter: 08/17/2013     Principal Problem:   Defibrillator discharge Active Problems:   PROSTATE CANCER   HYPOTHYROIDISM   HYPERTENSION   CORONARY ARTERY DISEASE   Atrial fibrillation   Chronic systolic heart failure   PERIPHERAL VASCULAR DISEASE   AUTOMATIC IMPLANTABLE CARDIAC DEFIBRILLATOR SITU   CHRONIC KIDNEY DISEASE STAGE IV (SEVERE)   Long term current use of anticoagulant   Fever   Acute on chronic kidney failure   Charcot's arthropathy-left foot     SUBJECTIVE  The patient feels well today.  He denies any chest pain or shortness of breath.  He is not having any chills or fever.  He has not had any further shocks from his ICD.  He has received IV amiodarone for 48 hours.  We will switch him over to amiodarone 200 mg twice a day today.  CURRENT MEDS . ALPRAZolam  0.5 mg Oral QID  . amiodarone  200 mg Oral BID  . amitriptyline  25 mg Oral QHS  . antiseptic oral rinse  15 mL Mouth Rinse BID  . calcitRIOL  0.5 mcg Oral Daily  . carvedilol  3.125 mg Oral BID WC  . donepezil  10 mg Oral QHS  . isosorbide dinitrate  10 mg Oral TID  . levothyroxine  100 mcg Oral QAC breakfast  . mupirocin cream   Topical BID  . omega-3 acid ethyl esters  2 g Oral Daily  . pantoprazole  40 mg Oral Daily  . sodium bicarbonate  650 mg Oral BID  . sodium chloride  3 mL Intravenous Q12H  . sodium chloride  3 mL Intravenous Q12H  . tamsulosin  0.4 mg Oral PC supper  . warfarin  2.5 mg Oral ONCE-1800  . Warfarin - Pharmacist Dosing Inpatient   Does not apply q1800    OBJECTIVE  Filed Vitals:   08/16/13 2215 08/16/13 2217 08/17/13 0620 08/17/13 0735  BP: 126/50 126/50 128/58 124/51  Pulse: 60 60 66 59  Temp: 98.1 F (36.7 C) 98.1 F (36.7 C)  97.5 F (36.4 C)  TempSrc: Oral Oral  Oral  Resp: 20 20  20   Height:      Weight:    171 lb 1.2 oz (77.6 kg)  SpO2: 96% 96%  94%    Intake/Output Summary (Last 24 hours) at 08/17/13 1146 Last  data filed at 08/17/13 6270  Gross per 24 hour  Intake    492 ml  Output    700 ml  Net   -208 ml   Filed Weights   08/15/13 1636 08/16/13 0600 08/17/13 0735  Weight: 170 lb (77.111 kg) 170 lb 8 oz (77.338 kg) 171 lb 1.2 oz (77.6 kg)    PHYSICAL EXAM  General: Pleasant, NAD. Neuro: Alert and oriented X 3. Moves all extremities spontaneously. Psych: Normal affect. HEENT:  Normal  Neck: Supple without bruits or JVD. Lungs:  Resp regular and unlabored, CTA. Heart: RRR no s3, s4, or murmurs. Abdomen: Soft, non-tender.  He has extensive scarring of his abdomen and he has a functioning ileostomy in the right lower quadrant of the abdomen. Extremities: No phlebitis.  Mild erythema surrounding area of trauma on the left lower anterior leg.  Accessory Clinical Findings  CBC  Recent Labs  08/15/13 0405 08/16/13 0315 08/17/13 0348  WBC 24.3* 20.3* 12.2*  NEUTROABS 21.8*  --  9.6*  HGB 13.0 11.9* 11.1*  HCT 38.4* 34.5* 32.6*  MCV 95.8 93.8 93.4  PLT 173 142* 818*   Basic Metabolic Panel  Recent Labs  08/15/13 0405 08/15/13 0425  08/16/13 0315 08/17/13 0348  NA 136*  --   < > 134* 130*  K 3.5*  --   < > 4.4 4.1  CL 96  --   < > 95* 92*  CO2 22  --   < > 21 21  GLUCOSE 121*  --   < > 102* 114*  BUN 65*  --   < > 73* 78*  CREATININE 4.09*  --   < > 4.28* 4.39*  CALCIUM 9.7  --   < > 9.5 9.3  MG  --  1.7  --   --   --   < > = values in this interval not displayed. Liver Function Tests No results found for this basename: AST, ALT, ALKPHOS, BILITOT, PROT, ALBUMIN,  in the last 72 hours No results found for this basename: LIPASE, AMYLASE,  in the last 72 hours Cardiac Enzymes  Recent Labs  08/16/13 1655 08/17/13 0004 08/17/13 0348  TROPONINI <0.30 <0.30 <0.30   BNP No components found with this basename: POCBNP,  D-Dimer No results found for this basename: DDIMER,  in the last 72 hours Hemoglobin A1C No results found for this basename: HGBA1C,  in the last 72  hours Fasting Lipid Panel No results found for this basename: CHOL, HDL, LDLCALC, TRIG, CHOLHDL, LDLDIRECT,  in the last 72 hours Thyroid Function Tests  Recent Labs  08/15/13 1700  TSH 0.295*    TELE  Telemetry shows atrial paced rhythm  ECG    Radiology/Studies  Dg Chest Port 1 View  08/15/2013   CLINICAL DATA:  Fever.  EXAM: PORTABLE CHEST - 1 VIEW  COMPARISON:  02/01/2012  FINDINGS: Dual-chamber left approach ICD/pacer, lead orientation stable from prior. Cardiomegaly, status post CABG. No edema or consolidation. No effusion or pneumothorax.  IMPRESSION: Negative for pneumonia.   Electronically Signed   By: Jorje Guild M.D.   On: 08/15/2013 05:02    ASSESSMENT AND PLAN 1.Recurrent ICD shocks  2. VT  3. ICM, with chronic systolic heart failure  4. Chronic renal insufficiency  5. Elevated WBC with hard shaking chill, worrisome for pnuemonia or influenza or transient bacteremia.  No progressive symptoms.  White blood cell count is significantly improved today. 6. Indwelling ostomy    Plan: Convert to oral amiodarone today in a dose of 200 mg twice a day. Anticipate home tomorrow if stable.  Signed, Darlin Coco MD

## 2013-08-18 DIAGNOSIS — I1310 Hypertensive heart and chronic kidney disease without heart failure, with stage 1 through stage 4 chronic kidney disease, or unspecified chronic kidney disease: Secondary | ICD-10-CM | POA: Clinically undetermined

## 2013-08-18 LAB — BASIC METABOLIC PANEL
BUN: 84 mg/dL — ABNORMAL HIGH (ref 6–23)
CALCIUM: 9.8 mg/dL (ref 8.4–10.5)
CO2: 17 mEq/L — ABNORMAL LOW (ref 19–32)
CREATININE: 4.64 mg/dL — AB (ref 0.50–1.35)
Chloride: 91 mEq/L — ABNORMAL LOW (ref 96–112)
GFR calc Af Amer: 12 mL/min — ABNORMAL LOW (ref >90)
GFR calc non Af Amer: 10 mL/min — ABNORMAL LOW (ref >90)
Glucose, Bld: 94 mg/dL (ref 70–99)
Potassium: 3.8 mEq/L (ref 3.7–5.3)
Sodium: 128 mEq/L — ABNORMAL LOW (ref 137–147)

## 2013-08-18 LAB — CBC WITH DIFFERENTIAL/PLATELET
BASOS ABS: 0 10*3/uL (ref 0.0–0.1)
BASOS PCT: 1 % (ref 0–1)
EOS ABS: 0.6 10*3/uL (ref 0.0–0.7)
EOS PCT: 7 % — AB (ref 0–5)
HCT: 33.6 % — ABNORMAL LOW (ref 39.0–52.0)
Hemoglobin: 11.4 g/dL — ABNORMAL LOW (ref 13.0–17.0)
Lymphocytes Relative: 19 % (ref 12–46)
Lymphs Abs: 1.7 10*3/uL (ref 0.7–4.0)
MCH: 31.1 pg (ref 26.0–34.0)
MCHC: 33.9 g/dL (ref 30.0–36.0)
MCV: 91.6 fL (ref 78.0–100.0)
MONO ABS: 0.9 10*3/uL (ref 0.1–1.0)
Monocytes Relative: 11 % (ref 3–12)
Neutro Abs: 5.4 10*3/uL (ref 1.7–7.7)
Neutrophils Relative %: 63 % (ref 43–77)
PLATELETS: 158 10*3/uL (ref 150–400)
RBC: 3.67 MIL/uL — ABNORMAL LOW (ref 4.22–5.81)
RDW: 14.2 % (ref 11.5–15.5)
WBC: 8.6 10*3/uL (ref 4.0–10.5)

## 2013-08-18 LAB — PROTIME-INR
INR: 2.17 — AB (ref 0.00–1.49)
Prothrombin Time: 23.5 seconds — ABNORMAL HIGH (ref 11.6–15.2)

## 2013-08-18 MED ORDER — WARFARIN SODIUM 5 MG PO TABS
5.0000 mg | ORAL_TABLET | Freq: Once | ORAL | Status: AC
  Administered 2013-08-18: 19:00:00 5 mg via ORAL
  Filled 2013-08-18 (×2): qty 1

## 2013-08-18 NOTE — Progress Notes (Addendum)
Subjective: Denies chest pain, SOB, ICD firing, feeling well wants to go home.    Objective: Vital signs in last 24 hours: Filed Vitals:   08/17/13 0620 08/17/13 0735 08/17/13 1300 08/18/13 0500  BP: 128/58 124/51 115/44 132/51  Pulse: 66 59 57 59  Temp:  97.5 F (36.4 C) 97.9 F (36.6 C) 97.7 F (36.5 C)  TempSrc:  Oral Oral Oral  Resp:  20 20 20   Height:      Weight:  171 lb 1.2 oz (77.6 kg)  170 lb (77.111 kg)  SpO2:  94% 96% 96%   Weight change:   Intake/Output Summary (Last 24 hours) at 08/18/13 1856 Last data filed at 08/18/13 3149  Gross per 24 hour  Intake    460 ml  Output    300 ml  Net    160 ml   Physical exam General: alert, cooperative, and in NAD HEENT: NCAT Lungs: clear to ascultation bilaterally Heart: regular rate and rhythm, no murmurs, gallops, or rubs Abdomen: soft, ntnd, normal bowel sounds; scarring of abdomen with functioning ileostomy in right lower quadrant, some erythema of skin lateral to stoma site Extremities: chronic appearing ulcer of left heel with serosanguinous discharge; no edema, left lower leg with erythema and abrasion, left heel with ulceration, right great toe with mild abrasion Skin: buttocks with moisture assoc. skin breakdown  Neurologic: alert & oriented X3, cranial nerves II-XII intact, physical deconditioning   Lab Results: Basic Metabolic Panel:  Recent Labs Lab 08/15/13 0405 08/15/13 0425  08/17/13 0348 08/18/13 0435  NA 136*  --   < > 130* 128*  K 3.5*  --   < > 4.1 3.8  CL 96  --   < > 92* 91*  CO2 22  --   < > 21 17*  GLUCOSE 121*  --   < > 114* 94  BUN 65*  --   < > 78* 84*  CREATININE 4.09*  --   < > 4.39* 4.64*  CALCIUM 9.7  --   < > 9.3 9.8  MG  --  1.7  --   --   --   < > = values in this interval not displayed. CBC:   Recent Labs Lab 08/17/13 0348 08/18/13 0435  WBC 12.2* 8.6  NEUTROABS 9.6* 5.4  HGB 11.1* 11.4*  HCT 32.6* 33.6*  MCV 93.4 91.6  PLT 131* 158   Cardiac Enzymes:   Recent  Labs Lab 08/16/13 1655 08/17/13 0004 08/17/13 0348  TROPONINI <0.30 <0.30 <0.30   BNP:  Recent Labs Lab 08/15/13 1627  PROBNP 31605.0*   CBG:  Recent Labs Lab 08/15/13 1634 08/15/13 2113  GLUCAP 109* 115*   Thyroid Function Tests:  Recent Labs Lab 08/15/13 1700 08/16/13 1030  TSH 0.295*  --   FREET4  --  1.71   Coagulation:  Recent Labs Lab 08/15/13 0405 08/16/13 0315 08/17/13 0348 08/18/13 0435  LABPROT 29.9* 27.0* 27.0* 23.5*  INR 2.98* 2.61* 2.61* 2.17*   Urinalysis:  Recent Labs Lab 08/15/13 0551  COLORURINE YELLOW  LABSPEC 1.014  PHURINE 5.0  GLUCOSEU NEGATIVE  HGBUR NEGATIVE  BILIRUBINUR NEGATIVE  KETONESUR NEGATIVE  PROTEINUR NEGATIVE  UROBILINOGEN 0.2  NITRITE NEGATIVE  LEUKOCYTESUR NEGATIVE    Studies/Results: X-ray Chest Pa And Lateral   08/16/2013   CLINICAL DATA:  Fever  EXAM: CHEST  2 VIEW  COMPARISON:  08/15/2013  FINDINGS: Cardiomediastinal silhouette is stable. Dual lead cardiac pacemaker is unchanged is position. Osteopenia and mild  degenerative changes thoracic spine. Atherosclerotic calcifications of thoracic aorta. Status post median sternotomy. There is streaky left base retrocardiac atelectasis or infiltrate. No pulmonary edema.  IMPRESSION: Streaky left base retrocardiac atelectasis or infiltrate. No pulmonary edema.   Electronically Signed   By: Lahoma Crocker M.D.   On: 08/16/2013 12:50   Dg Foot Complete Left  08/16/2013   CLINICAL DATA:  Left heel ulcer, concern for osteomyelitis  EXAM: LEFT FOOT - COMPLETE 3+ VIEW  COMPARISON:  09/08/2005  FINDINGS: Degenerative changes involving the midfoot with abduction at the tarsometatarsal joints, new from 2007. Associated collapse/flattening of the midfoot on the lateral view. This appearance favors Charcot arthropathy.  Reportedly, the patient has a heel ulcer, although this is not radiographically evident. However, there is no cortical regularity involving the calcaneus to suggest  osteomyelitis.  IMPRESSION: No radiographic findings to suggest osteomyelitis of the calcaneus.  Suspected Charcot arthropathy involving the midfoot.   Electronically Signed   By: Julian Hy M.D.   On: 08/16/2013 13:20   Medications: I have reviewed the patient's current medications. Scheduled Meds: . ALPRAZolam  0.5 mg Oral QID  . amiodarone  200 mg Oral BID  . amitriptyline  25 mg Oral QHS  . antiseptic oral rinse  15 mL Mouth Rinse BID  . calcitRIOL  0.5 mcg Oral Daily  . carvedilol  3.125 mg Oral BID WC  . donepezil  10 mg Oral QHS  . isosorbide dinitrate  10 mg Oral TID  . levothyroxine  100 mcg Oral QAC breakfast  . mupirocin cream   Topical BID  . omega-3 acid ethyl esters  2 g Oral Daily  . pantoprazole  40 mg Oral Daily  . sodium bicarbonate  650 mg Oral BID  . sodium chloride  3 mL Intravenous Q12H  . sodium chloride  3 mL Intravenous Q12H  . tamsulosin  0.4 mg Oral PC supper  . Warfarin - Pharmacist Dosing Inpatient   Does not apply q1800   PRN Meds:.sodium chloride, acetaminophen, acetaminophen, nitroGLYCERIN, sodium chloride Assessment/Plan: 80 y/o with extensive PMH presented after defibillator firing x 4, fever at home (afebrile while inpatient), WBC 24.3 on admission.   #Defebrillator firing, resolved- Defibrillator went off twice on 1/7 for ventricular tachycardia, twice on 1/8 for atrial fibrillation with RVR per device interrogation. ?Etiology, thought most likely due to infection with WBC 24.3 on admission --> 20.3 --> 12.2 --> 8.6 today without antibiotics; afebrile, no tachycardia or tachypnea.  CXR, UA and urine cx negative. Left foot xray negative for calcaneus osteomyelitis. Flu negative. Blood cultures x 2 NGTD. Troponins x 3 negative. pBNP elevated at [address] though not volume overloaded on exam.  Wound care thinks ostomy looks ok.  -cardiology consult, appreciate recs- Amio gtt x 48 hours through 1/11 then transitioned to amiodarone 200 mg PO BID    -continue Coreg -SLP recommended dysphagia 3 diet (same as patient has been doing at home) -PT recommending SNF but family prefers home health PT, arranged with case management  #Acute on CKD4- Baseline Cr 2.5-3.  Cr on admission 4.09 --> 3.96 --> 4.28 -->4.39 --> 4.64 today.  Cr did not improve with gentle fluids yesterday.  Weight stable.  Will monitor at lease through tomorrow as this may be due to decreased cardiac function in light of last week's events.  If does not improve, likely cardiorenal.  Will need close renal follow-up outpatient.  -continue holding Lasix -continue home sodium bicarb, calcitriol  -BMP in AM   #CHF,  systolic- Echo in [id number] showed EF 20%.  pBNP [id number] though patient clinically euvolemic.  -continue Coreg 3.125 mg BID, continue holding Lasix  -would likely benefit from adding ACEi but will not do this is setting of AKI  #HTN- BP stable -continue Coreg per above  #Atrial fibrillation- on Coumadin INR 2.98 --> 2.61 --> 2.61 --> 2.17 today  -Coumadin per pharmacy   #Hypothyroidism- TSH 0.295.  On Synthroid 100 mcg daily at home. T4 wnl at 1.71, T3 34.2 (low) -continue Synthroid 100 mcg daily  -follow-up outpatient PCP Dr. Laqueta Due in [place]: Disposition is deferred at this time, awaiting improvement of current medical problems.  Anticipated discharge tomorrow.   The patient does have a current PCP Ernestene Kiel, MD) and does need an [facility] hospital follow-up appointment after discharge.   .Services Needed at time of discharge: Y = Yes, Blank = No PT: H/H  OT:   RN:   Equipment:   Other:     LOS: 3 days   Ivin Poot, MD [phone] 08/18/2013, 7:07 AM

## 2013-08-18 NOTE — Consult Note (Signed)
My partner evaluated patient just yesterday.  I will request her to follow up with daughter on ileostomy today if needed.   Jahad Old Sultan RN,CWOCN [phone]

## 2013-08-18 NOTE — Consult Note (Addendum)
WOC follow-up: Spent an hour with daughter yesterday assessing wounds and discussing ostomy troubleshooting. Today, another daughter requested consult to discuss the same subjects. Family very well-informed regarding pouching routines and obtaining supplies.  Pt has 2X2X.1cm 100% red, moist partial thickness abrasion to outer abd near pouch which has become more irritated and red than previous day R/T 3 pouch changes and tape stripping.  Family prefers to use Medipore tape, not the pink tape that was applied yesterday.  Foam dressing applied over site to protect and promote healing.  Current pouch intact with good seal; large amt brown stool in pouch. Pt has his own ostomy supplies at the bedside and family members prefer to perform pouch changes.  Daughter denies further questions or need for assistance after consult performed. Please re-consult if further assistance is needed.  Thank-you,  Julien Girt MSN, [place], [place], [place], [place]

## 2013-08-18 NOTE — Progress Notes (Signed)
  Date: 08/18/2013  Patient name: [name]  Medical record number: [id number]  Date of birth: 1925/07/15   This patient has been seen and the plan of care was discussed with the house staff. Please see their note for complete details. I concur with their findings with the following additions/corrections: Feels well. No CP, SOB. No further ICD firing noted. Now on amiodarone.  Of concern is his rising Cr, AKI. Given his recent event, cardiorenal physiology is of concern. Follow daily BMP, UOP. Hold lasix for now. If continues to deteriorate, will need renal input.  Dominic Pea, DO, [facility] 08/18/2013, 11:21 AM

## 2013-08-18 NOTE — Progress Notes (Signed)
Physical Therapy Treatment Patient Details Name: [name] MRN: [id number] DOB: 02-14-1925 Today's Date: 08/18/2013 Time: 5009-3818 PT Time Calculation (min): 32 min  PT Assessment / Plan / Recommendation  History of Present Illness The patient is 80 year old male with past medical history of coronary disease (s/p CABG - 1998), ischemic cardiomyopathy (s/p BSX dual chamber ICD implanted 2003 with generator change 2011), atrial fibrillation (on Warfarin and low dose Amiodarone), and chronic renal insufficiency (baseline creat 2. 5 to 3.2), hypertension, hyperlipidemia, hypothyroidism, peripheral vascular disease, history of prostate cancer, who presents with defibrillator firing and fever. At baseline, patient is living with his wife at home. He uses walker to walk around in his house. He was in his usual state of health until two days ago when his ICD fired. Patient reports that his ICD fired twice  two days ago when he was "standing and thinking". He did not have chest pain, shortness of breath or palpitation.  He called his cardiologist who advised him that if it fired again, he should come to the ED. His ICD fired twice last night. Therefore he came to ED for evaluation. Device interrogation in ED reveals appropriate therapy for VT with failed ATP followed by ICD shocks which restored SR.  He has also had 2 episodes of atrial fib with RVR for which he received inappropriate therapy (ATP) per Dr. Tanna Furry note.    PT Comments   Pt admitted with above. Pt currently with functional limitations due to balance and endurance deficits.  Pt will benefit from skilled PT to increase their independence and safety with mobility to allow discharge to the venue listed below.   Follow Up Recommendations  SNF;Supervision/Assistance - 24 hour                 Equipment Recommendations  None recommended by PT        Frequency Min 3X/week   Progress towards PT Goals Progress towards PT goals:  Progressing toward goals  Plan Current plan remains appropriate    Precautions / Restrictions Precautions Precautions: Fall;ICD/Pacemaker Restrictions Weight Bearing Restrictions: No   Pertinent Vitals/Pain VSS, no pain    Mobility  Bed Mobility Overal bed mobility: Needs Assistance Bed Mobility: Supine to Sit Supine to sit: Mod assist General bed mobility comments: A to elevate trunk and LE OOB with max cues for safe technique.Daughter emptied pts colostomy as it was full.  PT washed pts back and buttocks as colostomy leaked earlier per daughter.  Transfers Overall transfer level: Needs assistance Equipment used: Rolling walker (2 wheeled) Transfers: Sit to/from Stand Sit to Stand: Mod assist General transfer comment: Pt leans to left during ambulation and requires max verbal and physical cues for technique and keeping RW close to the body.   Ambulation/Gait Ambulation/Gait assistance: Mod assist Ambulation Distance (Feet): 50 Feet Assistive device: Rolling walker (2 wheeled) Gait Pattern/deviations: Step-to pattern;Shuffle;Trunk flexed Gait velocity: decreased Gait velocity interpretation: <1.8 ft/sec, indicative of risk for recurrent falls General Gait Details: Pt has charcot foot.  Cues needed to stay close to RW.       PT Goals (current goals can now be found in the care plan section) Acute Rehab PT Goals Patient Stated Goal: To get stronger and be able to walk again  Visit Information  Last PT Received On: 08/18/13 Assistance Needed: +2 PT/OT/SLP Co-Evaluation/Treatment: Yes Reason for Co-Treatment: Complexity of the patient's impairments (multi-system involvement) PT goals addressed during session: Mobility/safety with mobility OT goals addressed during session: ADL's  and self-care History of Present Illness: The patient is 80 year old male with past medical history of coronary disease (s/p CABG - 1998), ischemic cardiomyopathy (s/p BSX dual chamber ICD implanted  2003 with generator change 2011), atrial fibrillation (on Warfarin and low dose Amiodarone), and chronic renal insufficiency (baseline creat 2. 5 to 3.2), hypertension, hyperlipidemia, hypothyroidism, peripheral vascular disease, history of prostate cancer, who presents with defibrillator firing and fever. At baseline, patient is living with his wife at home. He uses walker to walk around in his house. He was in his usual state of health until two days ago when his ICD fired. Patient reports that his ICD fired twice  two days ago when he was "standing and thinking". He did not have chest pain, shortness of breath or palpitation.  He called his cardiologist who advised him that if it fired again, he should come to the ED. His ICD fired twice last night. Therefore he came to ED for evaluation. Device interrogation in ED reveals appropriate therapy for VT with failed ATP followed by ICD shocks which restored SR.  He has also had 2 episodes of atrial fib with RVR for which he received inappropriate therapy (ATP) per Dr. Tanna Furry note.     Subjective Data  Subjective: "I will try." Patient Stated Goal: To get stronger and be able to walk again   Cognition  Cognition Arousal/Alertness: Awake/alert Behavior During Therapy: WFL for tasks assessed/performed Overall Cognitive Status: Within Functional Limits for tasks assessed    Balance  Balance Overall balance assessment: Needs assistance;History of Falls Sitting-balance support: Bilateral upper extremity supported;Feet supported Sitting balance-Leahy Scale: Good Postural control: Left lateral lean Standing balance support: Bilateral upper extremity supported;During functional activity Standing balance-Leahy Scale: Fair  End of Session PT - End of Session Equipment Utilized During Treatment: Gait belt Activity Tolerance: Patient limited by fatigue Patient left: in chair;with call bell/phone within reach;with family/visitor present Nurse  Communication: Mobility status        INGOLD,Darah Simkin 08/18/2013, 2:01 PM [facility] Acute Rehabilitation [phone] (pager)

## 2013-08-18 NOTE — Progress Notes (Signed)
Patient: ZYEN TRIGGS Date of Encounter: 08/18/2013, 10:11 AM Admit date: 08/15/2013     Subjective  Mr. Petroni has no new cardiac complaints.   Objective  Physical Exam: Vitals: BP 132/51  Pulse 59  Temp(Src) 97.7 F (36.5 C) (Oral)  Resp 20  Ht 5\' 8"  (1.727 m)  Wt 170 lb (77.111 kg)  BMI 25.85 kg/m2  SpO2 96% General: Well developed 80 year old male in no acute distress. Neck: Supple. JVD not elevated. Lungs: Clear bilaterally to auscultation without wheezes, rales, or rhonchi. Breathing is unlabored. Heart: RRR S1 S2 without murmurs, rubs, or gallops.  Abdomen: Soft, non-distended. Extremities: No clubbing or cyanosis. No edema.   Neuro: Alert and oriented X 3. Moves all extremities spontaneously. No focal deficits.  Intake/Output:  Intake/Output Summary (Last 24 hours) at 08/18/13 1011 Last data filed at 08/18/13 0622  Gross per 24 hour  Intake    460 ml  Output    300 ml  Net    160 ml    Inpatient Medications:  . ALPRAZolam  0.5 mg Oral QID  . amiodarone  200 mg Oral BID  . amitriptyline  25 mg Oral QHS  . antiseptic oral rinse  15 mL Mouth Rinse BID  . calcitRIOL  0.5 mcg Oral Daily  . carvedilol  3.125 mg Oral BID WC  . donepezil  10 mg Oral QHS  . isosorbide dinitrate  10 mg Oral TID  . levothyroxine  100 mcg Oral QAC breakfast  . mupirocin cream   Topical BID  . omega-3 acid ethyl esters  2 g Oral Daily  . pantoprazole  40 mg Oral Daily  . sodium bicarbonate  650 mg Oral BID  . sodium chloride  3 mL Intravenous Q12H  . sodium chloride  3 mL Intravenous Q12H  . tamsulosin  0.4 mg Oral PC supper  . Warfarin - Pharmacist Dosing Inpatient   Does not apply q1800    Labs:  Recent Labs  08/17/13 0348 08/18/13 0435  NA 130* 128*  K 4.1 3.8  CL 92* 91*  CO2 21 17*  GLUCOSE 114* 94  BUN 78* 84*  CREATININE 4.39* 4.64*  CALCIUM 9.3 9.8    Recent Labs  08/17/13 0348 08/18/13 0435  WBC 12.2* 8.6  NEUTROABS 9.6* 5.4  HGB 11.1* 11.4*    HCT 32.6* 33.6*  MCV 93.4 91.6  PLT 131* 158    Recent Labs  08/16/13 1030 08/16/13 1655 08/17/13 0004 08/17/13 0348  TROPONINI <0.30 <0.30 <0.30 <0.30    Recent Labs  08/15/13 1700  TSH 0.295*    Recent Labs  08/18/13 0435  INR 2.17*    Radiology/Studies: X-ray Chest Pa And Lateral   08/16/2013   CLINICAL DATA:  Fever  EXAM: CHEST  2 VIEW  COMPARISON:  08/15/2013  FINDINGS: Cardiomediastinal silhouette is stable. Dual lead cardiac pacemaker is unchanged is position. Osteopenia and mild degenerative changes thoracic spine. Atherosclerotic calcifications of thoracic aorta. Status post median sternotomy. There is streaky left base retrocardiac atelectasis or infiltrate. No pulmonary edema.  IMPRESSION: Streaky left base retrocardiac atelectasis or infiltrate. No pulmonary edema.   Electronically Signed   By: Lahoma Crocker M.D.   On: 08/16/2013 12:50   Dg Chest Port 1 View  08/15/2013   CLINICAL DATA:  Fever.  EXAM: PORTABLE CHEST - 1 VIEW  COMPARISON:  02/01/2012  FINDINGS: Dual-chamber left approach ICD/pacer, lead orientation stable from prior. Cardiomegaly, status post CABG. No edema or consolidation.  No effusion or pneumothorax.  IMPRESSION: Negative for pneumonia.   Electronically Signed   By: Jorje Guild M.D.   On: 08/15/2013 05:02    Telemetry: A paced V sensed; no further VT   Assessment and Plan  1. Recurrent ICD shocks  - appropriate therapy for VT in addition to inappropriate therapy for rapid AFib  - now on amiodarone without recurrence 2. VT  - as above, no recurrent VT on amiodarone - continue 200 mg twice daily - follow-up as previously scheduled with Dr. Lovena Le on 09/02/2013 (entered into AVS) 3. ICM with chronic systolic heart failure  4. Chronic renal failure  Dr. Lovena Le to see Signed, Jacqualine Mau  EP Attending  Patient seen and examined. Agree with above. He has stabilized with no recurrent VT. I have reviewed his history and note  worsening renal function. Patient currently not interested in HD. I think he is ready for discharge from a cardiac/EP perspective. He is close to needing hospice. I spoke to his daughter today. At the point that he would like to consider hospice, we would turn off tachy therapies but would continue amiodarone. At discharge he is instructed to take amiodarone 200 mg twice daily. I will have him see Jerene Pitch back in the office in 2 weeks and me back in 2 months.  Mikle Bosworth.D.

## 2013-08-18 NOTE — Care Management Note (Addendum)
  Page 2 of 2   08/18/2013     2:17:52 PM   CARE MANAGEMENT NOTE 08/18/2013  Patient:  [name], [name]   Account Number:  [id number]  Date Initiated:  08/17/2013  Documentation initiated by:  [facility]  Subjective/Objective Assessment:   adm: Defibrillator discharge     Action/Plan:   discharge planning   Anticipated DC Date:  08/18/2013   Anticipated DC Plan:  [place]  CM consult      [facility] Choice  HOME HEALTH   Choice offered to / List presented to:  C-4 Adult Children            agency  [place]   Status of service:  In process, will continue to follow Medicare Important Message given?   (If response is "NO", the following Medicare IM given date fields will be blank) Date Medicare IM given:   Date Additional Medicare IM given:    Discharge Disposition:    Per UR Regulation:  Reviewed for med. necessity/level of care/duration of stay  If discussed at [place] of Stay Meetings, dates discussed:    Comments:  08/18/2013 11:33am Last UR Date:  08/18/2013 From Home with Wife. PCG/Step-DTR/[name] [phone] (lives with patient).  StepDTR states  Hx/o ileostomy and pateint refuses HD.  States interest in Hospice but also interested in rehab/PT services at this time. Home DME:  walker w/wheels and seat, BSC, W/c Elects [facility] Provider:  [facility] [phone] phone and [phone] fax  for RN, PT, [place] and Step-DTR will discuss with wife and let CM know. CM will fax orders to [facility] provider at time of d/c ADD:  +1-2 days d/t increased Creat 4.64 and BUN 84. Marland Reine RN, BSN, [place], CCM 08/18/2013   08/17/13 16:35 CM spoke with step-daughter, [name] [phone], and pt in room to offer choice as pt's daughter [name] [phone] expressed to Dr. Aundra Dubin The family would prefer pt to go home with [facility] not SNF.  All family  members are in agreement that pt should go home.  [name] works for [employer] as a Quarry manager though she also expressed interest in [facility].  She states she will talk it over with family members and make the decision tonight or tomorrow.  CM spoke with [name] and she is in agreement with [name]. CM will continue to follow for choice and disposition needs of pt.  [name], BSN, CM [phone].

## 2013-08-18 NOTE — Progress Notes (Signed)
UR completed Kitt Ledet K. Britta Louth, RN, BSN, MSHL, CCM  08/18/2013 11:58 AM

## 2013-08-18 NOTE — Progress Notes (Signed)
ANTICOAGULATION CONSULT NOTE - Follow Up Consult  Pharmacy Consult:  Coumadin Indication: atrial fibrillation  Allergies  Allergen Reactions  . Ciprofloxacin Hcl     Rash, itching  . Amoxicillin     REACTION: rash  . Clindamycin   . Penicillins     REACTION: rash    Patient Measurements: Height: 5\' 8"  (172.7 cm) Weight: 170 lb (77.111 kg) (scale a) IBW/kg (Calculated) : 68.4  Vital Signs: Temp: 97.7 F (36.5 C) (01/12 0500) Temp src: Oral (01/12 0500) BP: 132/51 mmHg (01/12 0500) Pulse Rate: 59 (01/12 0500)  Labs:  Recent Labs  08/16/13 0315  08/16/13 1655 08/17/13 0004 08/17/13 0348 08/18/13 0435  HGB 11.9*  --   --   --  11.1* 11.4*  HCT 34.5*  --   --   --  32.6* 33.6*  PLT 142*  --   --   --  131* 158  LABPROT 27.0*  --   --   --  27.0* 23.5*  INR 2.61*  --   --   --  2.61* 2.17*  CREATININE 4.28*  --   --   --  4.39* 4.64*  TROPONINI <0.30  < > <0.30 <0.30 <0.30  --   < > = values in this interval not displayed.  Estimated Creatinine Clearance: 11.1 ml/min (by C-G formula based on Cr of 4.64).     Assessment: 65 yoM who presents with report of ICD firing.  He continues on Coumadin for history of Afib.  INR therapeutic; no bleeding reported.   Goal of Therapy:  INR 2-3 Monitor platelets by anticoagulation protocol: Yes    Plan:  - Coumadin 5mg  PO today - Daily PT / INR    Devondre Guzzetta D. Mina Marble, PharmD, BCPS Pager:  [phone] 08/18/2013, 11:16 AM

## 2013-08-18 NOTE — Progress Notes (Signed)
Pt resting comfortable on bed, family member at the bedside, VSS no distress noticed.

## 2013-08-18 NOTE — Progress Notes (Signed)
Rehab Admissions Coordinator Note:  Patient was screened by Cleatrice Burke for appropriateness for an Inpatient Acute Rehab Consult.  At this time, we are recommending Inpatient Rehab consult if pt and family would like to consider rehab prior to d/c home with family. [phone]  Cleatrice Burke 08/18/2013, 8:57 PM  I can be reached at [phone].

## 2013-08-18 NOTE — Progress Notes (Signed)
Daughter had much concern over pt's ileostomy it has a small bloody erosion area. Wound RN had seen pt day before. Call placed for her to come and reevaluate and speak with daughter over her concerns.

## 2013-08-18 NOTE — Evaluation (Signed)
Occupational Therapy Evaluation Patient Details Name: [name] MRN: [id number] DOB: 1924-08-14 Today's Date: 08/18/2013 Time: 3086-5784 OT Time Calculation (min): 31 min  OT Assessment / Plan / Recommendation History of present illness The patient is 80 year old male with past medical history of coronary disease (s/p CABG - 1998), ischemic cardiomyopathy (s/p BSX dual chamber ICD implanted 2003 with generator change 2011), atrial fibrillation (on Warfarin and low dose Amiodarone), and chronic renal insufficiency (baseline creat 2. 5 to 3.2), hypertension, hyperlipidemia, hypothyroidism, peripheral vascular disease, history of prostate cancer, who presents with defibrillator firing and fever. At baseline, patient is living with his wife at home. He uses walker to walk around in his house. He was in his usual state of health until two days ago when his ICD fired. Patient reports that his ICD fired twice  two days ago when he was "standing and thinking". He did not have chest pain, shortness of breath or palpitation.  He called his cardiologist who advised him that if it fired again, he should come to the ED. His ICD fired twice last night. Therefore he came to ED for evaluation. Device interrogation in ED reveals appropriate therapy for VT with failed ATP followed by ICD shocks which restored SR.  He has also had 2 episodes of atrial fib with RVR for which he received inappropriate therapy (ATP) per Dr. Tanna Furry note.    Clinical Impression   Pt demos decline in function with ADLs and and ADL mobility safety and would benefit from acute OT services to address impairments to increase level of function and safety. Pt's dtr states that they plan for pt to return home where they will assist him. Pt pleasant and cooperative    OT Assessment  Patient needs continued OT Services    Follow Up Recommendations  Home health OT;Supervision/Assistance - 24 hour    Barriers to Discharge   none, family  plans to care for pt at home  Equipment Recommendations  None recommended by OT    Recommendations for Other Services    Frequency  Min 2X/week    Precautions / Restrictions Precautions Precautions: Fall;ICD/Pacemaker Restrictions Weight Bearing Restrictions: No   Pertinent Vitals/Pain No c/o pain    ADL  Grooming: Performed;Wash/dry hands;Wash/dry face;Supervision/safety;Set up Where Assessed - Grooming: Supported sitting Upper Body Bathing: Simulated;Set up;Minimal assistance Lower Body Bathing: Simulated;Maximal assistance Upper Body Dressing: Performed;Supervision/safety;Minimal assistance Where Assessed - Upper Body Dressing: Supine, head of bed up;Supported sitting Lower Body Dressing: +1 Total assistance Toilet Transfer: Simulated;Minimal assistance;Moderate assistance Toilet Transfer Method: Sit to stand Toileting - Clothing Manipulation and Hygiene: Performed;+1 Total assistance Where Assessed - Camera operator Manipulation and Hygiene: Standing Tub/Shower Transfer Method: Not assessed Transfers/Ambulation Related to ADLs: Pt leans to L side during ambulation and requires max verbal and physical cues for technique/keeping RW close to body ADL Comments: Pt stats that he wears slip in shoes at home and has a sock aid and button hook for dressing    OT Diagnosis: Generalized weakness  OT Problem List: Decreased strength;Decreased activity tolerance;Impaired balance (sitting and/or standing);Decreased safety awareness OT Treatment Interventions: Self-care/ADL training;Therapeutic activities;Therapeutic exercise;Neuromuscular education;Patient/family education;Balance training   OT Goals(Current goals can be found in the care plan section) Acute Rehab OT Goals Patient Stated Goal: To get stronger and be able to walk again OT Goal Formulation: With patient/family Time For Goal Achievement: 08/25/13 Potential to Achieve Goals: Good ADL Goals Pt Will Perform Upper Body  Bathing: with min guard assist;with supervision;with set-up;sitting Pt  Will Perform Lower Body Bathing: with mod assist;with min assist;sitting/lateral leans;sit to/from stand Pt Will Perform Upper Body Dressing: with min guard assist;with supervision;with set-up;sitting Pt Will Transfer to Toilet: with min assist;with min guard assist;ambulating;grab bars Pt Will Perform Toileting - Clothing Manipulation and hygiene: with max assist;sitting/lateral leans;sit to/from stand;with mod assist  Visit Information  Last OT Received On: 08/18/13 Assistance Needed: +2 PT/OT/SLP Co-Evaluation/Treatment: Yes Reason for Co-Treatment: Complexity of the patient's impairments (multi-system involvement);For patient/therapist safety OT goals addressed during session: ADL's and self-care History of Present Illness: The patient is 80 year old male with past medical history of coronary disease (s/p CABG - 1998), ischemic cardiomyopathy (s/p BSX dual chamber ICD implanted 2003 with generator change 2011), atrial fibrillation (on Warfarin and low dose Amiodarone), and chronic renal insufficiency (baseline creat 2. 5 to 3.2), hypertension, hyperlipidemia, hypothyroidism, peripheral vascular disease, history of prostate cancer, who presents with defibrillator firing and fever. At baseline, patient is living with his wife at home. He uses walker to walk around in his house. He was in his usual state of health until two days ago when his ICD fired. Patient reports that his ICD fired twice  two days ago when he was "standing and thinking". He did not have chest pain, shortness of breath or palpitation.  He called his cardiologist who advised him that if it fired again, he should come to the ED. His ICD fired twice last night. Therefore he came to ED for evaluation. Device interrogation in ED reveals appropriate therapy for VT with failed ATP followed by ICD shocks which restored SR.  He has also had 2 episodes of atrial fib with  RVR for which he received inappropriate therapy (ATP) per Dr. Tanna Furry note.        Prior [place] expects to be discharged to:: Private residence Living Arrangements: Spouse/significant other;Children Available Help at Discharge: Family Type of Home: House Home Access: [place]: One [place]: Radiation protection practitioner Equipment: Sock aid;Other (Comment) (button hook) Additional Comments: Pt has iliostomy bag Prior Function Level of Independence: Independent with assistive device(s) Gait / Transfers Assistance Needed: ambulates with rollater about 58' ADL's / Homemaking Assistance Needed: cooking, cleaning Comments: Pt takes sponge baths and able to dress himself. Pt has iliostomy Communication Communication: HOH Dominant Hand: Right         Vision/Perception Vision - History Baseline Vision: Wears glasses only for reading Patient Visual Report: No change from baseline Perception Perception: Within Functional Limits   Cognition  Cognition Arousal/Alertness: Awake/alert Behavior During Therapy: WFL for tasks assessed/performed Overall Cognitive Status: Within Functional Limits for tasks assessed    Extremity/Trunk Assessment Upper Extremity Assessment Upper Extremity Assessment: Overall WFL for tasks assessed;Generalized weakness Lower Extremity Assessment Lower Extremity Assessment: Defer to PT evaluation Cervical / Trunk Assessment Cervical / Trunk Assessment: Kyphotic     Mobility Bed Mobility Overal bed mobility: Needs Assistance Bed Mobility: Supine to Sit Transfers Overall transfer level: Independent Equipment used: Rolling walker (2 wheeled) Transfers: Sit to/from Stand Sit to Stand: Mod assist General transfer comment: Pt leans to L side during ambulation and requires max verbal and physical cues for technique/keeping RW close to body     Exercise     Balance Balance Overall  balance assessment: Needs assistance Sitting-balance support: Feet supported;Bilateral upper extremity supported Postural control: Left lateral lean Standing balance support: Bilateral upper extremity supported Standing balance-Leahy Scale: Fair   End of Session OT -  End of Session Equipment Utilized During Treatment: Gait belt;Rolling walker Activity Tolerance: Patient tolerated treatment well Patient left: in chair;with call bell/phone within reach;with family/visitor present  GO     Britt Bottom 08/18/2013, 12:18 PM

## 2013-08-18 NOTE — Progress Notes (Signed)
CSW received referral for SNF placement. Per CM note on 08/17/2013, patient and family have elected to return home with Home Health services. CSW signing off, Please re-consult if further social work needs arise.   Tilden Fossa, MSW, [place] Clinical Social Worker [facility] Emergency Dept. [phone]

## 2013-08-19 DIAGNOSIS — I472 Ventricular tachycardia: Secondary | ICD-10-CM | POA: Diagnosis present

## 2013-08-19 DIAGNOSIS — I4729 Other ventricular tachycardia: Secondary | ICD-10-CM | POA: Diagnosis present

## 2013-08-19 DIAGNOSIS — Z515 Encounter for palliative care: Secondary | ICD-10-CM

## 2013-08-19 LAB — BASIC METABOLIC PANEL
BUN: 95 mg/dL — AB (ref 6–23)
CALCIUM: 10 mg/dL (ref 8.4–10.5)
CO2: 17 mEq/L — ABNORMAL LOW (ref 19–32)
CREATININE: 4.78 mg/dL — AB (ref 0.50–1.35)
Chloride: 92 mEq/L — ABNORMAL LOW (ref 96–112)
GFR, EST AFRICAN AMERICAN: 12 mL/min — AB (ref >90)
GFR, EST NON AFRICAN AMERICAN: 10 mL/min — AB (ref >90)
GLUCOSE: 103 mg/dL — AB (ref 70–99)
Potassium: 4.4 mEq/L (ref 3.7–5.3)
Sodium: 128 mEq/L — ABNORMAL LOW (ref 137–147)

## 2013-08-19 LAB — PROTIME-INR
INR: 1.83 — ABNORMAL HIGH (ref 0.00–1.49)
PROTHROMBIN TIME: 20.6 s — AB (ref 11.6–15.2)

## 2013-08-19 MED ORDER — WARFARIN SODIUM 5 MG PO TABS
5.0000 mg | ORAL_TABLET | Freq: Once | ORAL | Status: AC
  Administered 2013-08-19: 5 mg via ORAL
  Filled 2013-08-19: qty 1

## 2013-08-19 MED ORDER — POLYVINYL ALCOHOL 1.4 % OP SOLN
2.0000 [drp] | OPHTHALMIC | Status: DC | PRN
  Administered 2013-08-19: 2 [drp] via OPHTHALMIC
  Filled 2013-08-19: qty 15

## 2013-08-19 MED ORDER — AMIODARONE HCL 200 MG PO TABS: 200.0000 mg | ORAL_TABLET | Freq: Two times a day (BID) | ORAL | Status: DC

## 2013-08-19 NOTE — Discharge Summary (Signed)
Name: [name] MRN: [id number] DOB: Jun 09, 1925 80 y.o. PCP: Ernestene Kiel, MD  Date of Admission: 08/15/2013  3:20 AM Date of Discharge: 08/19/2013 Attending Physician: Dominic Pea, DO  Discharge Diagnosis: Principal Problem:   Defibrillator discharge Active Problems:   PROSTATE CANCER   HYPOTHYROIDISM   HYPERTENSION   CORONARY ARTERY DISEASE   Atrial fibrillation   Chronic systolic heart failure   PERIPHERAL VASCULAR DISEASE   AUTOMATIC IMPLANTABLE CARDIAC DEFIBRILLATOR SITU   CHRONIC KIDNEY DISEASE STAGE IV (SEVERE)   Long term current use of anticoagulant   Fever   Acute on chronic kidney failure   Charcot's arthropathy-left foot    Paroxysmal ventricular tachycardia  Discharge Medications:   Medication List    STOP taking these medications       furosemide 40 MG tablet  Commonly known as:  LASIX      TAKE these medications       ALPRAZolam 0.5 MG tablet  Commonly known as:  XANAX  Take 1 tablet by mouth 4 (four) times daily. 1 tab in [name] a.m., .5 noon, and 3p.m., 1 tab at bedtime     amiodarone 200 MG tablet  Commonly known as:  PACERONE  Take 1 tablet (200 mg total) by mouth 2 (two) times daily.     amitriptyline 25 MG tablet  Commonly known as:  ELAVIL  Take 25 mg by mouth at bedtime.     calcitRIOL 0.5 MCG capsule  Commonly known as:  ROCALTROL  Take one tablet by mouth every other day Per Dr. Jimmy Footman     carvedilol 3.125 MG tablet  Commonly known as:  COREG  Take 3.125 mg by mouth 2 (two) times daily with a meal. TAKE ONE TABLET BY MOUTH TWICE A DAY WITH MEALS     donepezil 10 MG tablet  Commonly known as:  ARICEPT  Take 10 mg by mouth at bedtime.     fish oil-omega-3 fatty acids 1000 MG capsule  Take 2 g by mouth daily.     isosorbide dinitrate 10 MG tablet  Commonly known as:  ISORDIL  TAKE ONE TABLET BY MOUTH THREE TIMES DAILY     levothyroxine 100 MCG tablet  Commonly known as:  SYNTHROID, LEVOTHROID  Take one (1)  tablet(s) once daily     MULTIVITAMIN & MINERAL PO  Take 1 tablet by mouth daily.     nitroGLYCERIN 0.4 MG SL tablet  Commonly known as:  NITROSTAT  Place 1 tablet (0.4 mg total) under [name] tongue every 5 (five) minutes as needed.     omeprazole 40 MG capsule  Commonly known as:  PRILOSEC  Take 40 mg by mouth daily.     sodium bicarbonate 648 MG tablet  Take 2  tablets by mouth two times daily     tamsulosin 0.4 MG Caps capsule  Commonly known as:  FLOMAX  Take 0.4 mg by mouth daily after supper. As directed by Dr. Rosana Hoes     warfarin 5 MG tablet  Commonly known as:  COUMADIN  take as directed by coumadin clinic        Disposition and follow-up:   [name] was discharged from [facility] in Stable condition.  At [name] hospital follow up visit please address:  1.  Any further defibrillator discharges or fever?  2.  Reassess Lasix need (held at discharge); any shortness of breath or extremity swelling?  3.  Labs / imaging needed at time of follow-up: BMP  4.  Pending labs/ test needing follow-up: none  Follow-up Appointments: Follow-up Information   Follow up with [facility], MD On 08/28/2013. (9:30a)    Specialty:  Internal Medicine   Contact information:   [address] [place] [address] [phone]       Follow up with DETERDING,Demontrae L, MD On 09/02/2013. (8:45a)    Specialty:  Nephrology   Contact information:   [place] [address] [phone]       Follow up with Cristopher Peru, MD On 09/02/2013. (At 12:30 PM for hospital follow-up)    Specialty:  Cardiology   Contact information:   [id number] [place] [address] [phone]       Discharge Instructions:  Future Appointments Provider Department Dept Phone   09/02/2013 12:30 PM Evans Lance, MD [place] Office [phone]      Consultations: cardiology, palliative care  Procedures Performed:  X-ray Chest Pa  And Lateral   08/16/2013   CLINICAL DATA:  Fever  EXAM: CHEST  2 VIEW  COMPARISON:  08/15/2013  FINDINGS: Cardiomediastinal silhouette is stable. Dual lead cardiac pacemaker is unchanged is position. Osteopenia and mild degenerative changes thoracic spine. Atherosclerotic calcifications of thoracic aorta. Status post median sternotomy. There is streaky left base retrocardiac atelectasis or infiltrate. No pulmonary edema.  IMPRESSION: Streaky left base retrocardiac atelectasis or infiltrate. No pulmonary edema.   Electronically Signed   By: Lahoma Crocker M.D.   On: 08/16/2013 12:50   Dg Chest Port 1 View  08/15/2013   CLINICAL DATA:  Fever.  EXAM: PORTABLE CHEST - 1 VIEW  COMPARISON:  02/01/2012  FINDINGS: Dual-chamber left approach ICD/pacer, lead orientation stable from prior. Cardiomegaly, status post CABG. No edema or consolidation. No effusion or pneumothorax.  IMPRESSION: Negative for pneumonia.   Electronically Signed   By: Jorje Guild M.D.   On: 08/15/2013 05:02   Dg Foot Complete Left  08/16/2013   CLINICAL DATA:  Left heel ulcer, concern for osteomyelitis  EXAM: LEFT FOOT - COMPLETE 3+ VIEW  COMPARISON:  09/08/2005  FINDINGS: Degenerative changes involving [name] midfoot with abduction at [name] tarsometatarsal joints, new from 2007. Associated collapse/flattening of [name] midfoot on [name] lateral view. This appearance favors Charcot arthropathy.  Reportedly, [name] has a heel ulcer, although this is not radiographically evident. However, there is no cortical regularity involving [name] calcaneus to suggest osteomyelitis.  IMPRESSION: No radiographic findings to suggest osteomyelitis of [name] calcaneus.  Suspected Charcot arthropathy involving [name] midfoot.   Electronically Signed   By: Julian Hy M.D.   On: 08/16/2013 13:20    Admission HPI:  [name] is 80 year old male with past medical history of coronary disease (s/p CABG - 1998), ischemic cardiomyopathy (s/p BSX dual chamber ICD implanted  2003 with generator change 2011), atrial fibrillation (on Warfarin and low dose Amiodarone), and chronic renal insufficiency (baseline creat 2. 5 to 3.2), hypertension, hyperlipidemia, hypothyroidism, peripheral vascular disease, history of prostate cancer, who presents with defibrillator firing and fever.  At baseline, [name] is living with his wife at home. He uses walker to walk around in his house. He was in his usual state of health until two days ago when his ICD fired. [name] reports that his ICD fired twice two days ago when he was "standing and thinking". He did not have chest pain, shortness of breath or palpitation. He called his cardiologist who advised him that if it fired again, he should come  to [name] ED. His ICD fired twice last night. Therefore he came to ED for evaluation. Device interrogation in ED reveals appropriate therapy for VT with failed ATP followed by ICD shocks which restored SR. He has also had 2 episodes of atrial fib with RVR for which he received inappropriate therapy (ATP) per Dr. Tanna Furry note.  Family noted a fever last night and temperature was 101 at home. [name] does not have running nose, sore throat, SOB or chest pain. He has chills, not feeling hot. He has mild chronic cough, which has not been worsening recently. No sick contact, but he lives with 2 yo grandson and a daughter who is working in [name] by problem list:  1. Defebrillator discharges, resolved- [name]'s defibrillator discharged twice on 1/7 for ventricular tachycardia, twice on 1/8 for atrial fibrillation with RVR per device interrogation.  Questionable etiology, thought most likely due to infection with WBC 24.3 on admission.  Infectious workup negative including CXR, flu panel, UA, urine culture, left foot xray, and blood cultures x 2 NGTD.  Leucocytosis down-trended to within normal limits without intervention, and [name] was never febrile, no tachycardia or tachypnea.   Cardiology was consulted and placed [name] on amiodarone gtt x 48 hours.  [name] was converted to PO amiodarone 200 mg BID (twice home dose) on 1/11; he had no recurrent defibrillator discharges while inpatient. Cardiology follow-up arranged on 09/02/13.  Of note, unusual that troponins x 3 negative despite reported defibrillator discharges.  pBNP elevated [place] [address] on admission though [name] not volume overloaded on exam.  Palliative care was consulted on day of discharge to discuss goals of care and code status given poor prognosis with declining renal function (see below).  After that conversation, [name] remains full code.  [name] and family declined additional therapy and involvement, stating they just wanted to take their father home.  PT also recommended SNF, but [name] and his family prefer home health PT and OT which were arranged with case management.    2. Acute on CKD4- Baseline Cr 2.5-3. Cr on admission 4.09 --> 3.96 --> 4.28 -->4.39 --> 4.64 --> 4.78 (BUN 95) with worsening metabolic acidosis.  Likely ATN due to transient hypotension from cardiac event last week.  [name] is adamantly opposed to dialysis and has previously discussed this at length with Dr. Jimmy Footman.  Held Lasix throughout admission and at discharge.  Cr also did not improve with gentle fluids.  Continued home sodium bicarbonate and calcitriol while inpatient and at discharge.    Left lower extremity ?vasculitis may be related.  Renal follow-up arranged on 09/02/13.   3. CHF, systolic- Echo in [id number] showed EF 20%. pBNP [id number] though [name] clinically euvolemic.  Continued Coreg 3.125 mg BID but held home Lasix while inpatient and at discharge.  Would likely benefit from ACEi but did not add in setting of AKI.   4. HTN- BP stable, normotensive.  Continued Coreg per above.   5. Atrial fibrillation- on Coumadin.  Continued Coumadin per pharmacy while inpatient.  INR 1.83 (subtherapeutic) on day of discharge.  Per pharmacy recs,  [name] given Coumadin 5 mg prior to discharge then will take home dose of 5mg  on Wednesday 1/14.  Follow-up INR check arranged on 1/15 at 11a at PCP's office.   6. Hypothyroidism- TSH 0.295. On Synthroid 100 mcg daily at home; continued while inpatient and at discharge. T4 wnl at 1.71, T3 34.2 (low).  Follow-up outpatient; appt made with PCP Dr. Laqueta Due  on 08/28/13.     Discharge Vitals:   BP 124/49  Pulse 60  Temp(Src) 97.4 F (36.3 C) (Oral)  Resp 20  Ht 5\' 8"  (1.727 m)  Wt 167 lb 11.2 oz (76.068 kg)  BMI 25.50 kg/m2  SpO2 95%  Discharge Labs:  Results for orders placed during [name] hospital encounter of 08/15/13 (from [name] past 24 hour(s))  PROTIME-INR     Status: Abnormal   Collection Time    08/19/13  3:50 AM      Result Value Range   Prothrombin Time 20.6 (*) 11.6 - 15.2 seconds   INR 1.83 (*) 0.00 - 9.73  BASIC METABOLIC PANEL     Status: Abnormal   Collection Time    08/19/13  3:50 AM      Result Value Range   Sodium 128 (*) 137 - 147 mEq/L   Potassium 4.4  3.7 - 5.3 mEq/L   Chloride 92 (*) 96 - 112 mEq/L   CO2 17 (*) 19 - 32 mEq/L   Glucose, Bld 103 (*) 70 - 99 mg/dL   BUN 95 (*) 6 - 23 mg/dL   Creatinine, Ser 4.78 (*) 0.50 - 1.35 mg/dL   Calcium 10.0  8.4 - 10.5 mg/dL   GFR calc non Af Amer 10 (*) >90 mL/min   GFR calc Af Amer 12 (*) >90 mL/min    Signed: Ivin Poot, MD 08/19/2013, 3:24 PM   Time Spent on Discharge: 40 minutes Services Ordered on Discharge: none Equipment Ordered on Discharge: none

## 2013-08-19 NOTE — Progress Notes (Signed)
Pt states his back feels like it is "burning". Pt back assessed and is pink but blanching/warm to touch. No rash or itching noted. Pt states he has been laying in the same position for a while. Pt turned to side and given tylenol for pain. Will continue to monitor. Ronnette Hila, RN

## 2013-08-19 NOTE — Consult Note (Signed)
Patient W[place]:PXTGG H [place]      DOB: 02-Feb-1925      YIR:[id number]     Consult Note from the Palliative Medicine Team at [facility] Requested by: Dr. Lilia Pro     PCP: Ernestene Kiel, MD Reason for Consultation: Goals of care    Phone [phone]  Assessment of patients Current state: 80 yr old white male with admission for firing of AICD:  Course complicated by worsening renal function, vasculitic rash and confusion.  I met with the patient his daughter and step daughter, his wife via phone and his son in law.  Patient clearly states and his family is accepting that he desires to be a full code, with full curative treatments.  He is refusing dialysis and does not quite see how his time of death can be facilitated if he wants full code.  HIs family stated that they were taking him home and they would consider working with hospice later.  I provided them with Hard Choices booklet, MOST form and the phone number for [employer]   Goals of Care: 1.  Code Status: Full code   2. Scope of Treatment:  1. AICD: patient desires to continue with AICD 2. Vasculitic rash: unclear cause, per primary team 3.  Treat the treatable.   4. Disposition: Home with capacity for self referral to hospice   3. Symptom Management: Family satisfied with current interventions. No further recommendations    4. Psychosocial: Married, one daughter, one step daugher 5. Spiritual: they will be leaving today  And will seek support at home.        Patient Documents Completed or Given: Document Given Completed  Advanced Directives Pkt    MOST x   DNR    Gone from My Sight    Hard Choices x     Brief HPI: 80 yr old white male with chronic kidney disease, cad  With aicd.  Patient was admitted with fever and worsening renal function.  Course further complicated by vasculitic like rash.  We were asked to assist with goals of care.   ROS: tired, pain in limb,  confusion    PMH:  Past Medical History  Diagnosis Date  . Hypertension   . CAD (coronary artery disease)     remote CABG  . Ischemic cardiomyopathy   . Chronic systolic heart failure   . Atrial fibrillation   . Atrial flutter   . Peripheral vascular disease   . Hypercholesteremia   . Hypothyroid   . Colitis, ischemic     has ileostomy in place x 14 years  . BPH with urinary obstruction   . DJD (degenerative joint disease)   . Anxiety   . Anemia   . PVD (peripheral vascular disease)   . 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 High risk medication use     on amiodarone therapy  . Automatic implantable cardioverter-defibrillator in situ   . CHF (congestive heart failure)   . Myocardial infarction 1998  . GERD (gastroesophageal reflux disease)   . Chronic kidney disease (CKD), stage IV (severe)   . Prostate cancer 2003    tx'd w/radiation"  . Skin cancer     "basal and squamous; arms, face" (08/15/2013)     PSH: Past Surgical History  Procedure Laterality Date  . Total colectomy  1998    ischemic colitis  . Doppler echocardiography  2006, 2007  . Cardiac defibrillator placement  2003; 2011  BSX dual chamber ICD implanted by Dr Lovena Le 2003 with gen change 2011  . Coronary artery bypass graft  1998    CABG X8  . Colon surgery    . Permanent ileostomy  1998  . Incision and drainage  1998-1999    "had staph infection in stomach incision; had to have several ORs during his 3+ month hospitalization"  . Cataract extraction w/ intraocular lens  implant, bilateral Bilateral 2000's  . Skin cancer excision Right     arm  . Cardiac catheterization  1998; 10/26/1999   I have reviewed the [place] and SH and  If appropriate update it with new information. Allergies  Allergen Reactions  . Ciprofloxacin Hcl     Rash, itching  . Amoxicillin     REACTION: rash  . Clindamycin   . Penicillins     REACTION: rash   Scheduled Meds: . ALPRAZolam  0.5 mg Oral QID  .  amiodarone  200 mg Oral BID  . amitriptyline  25 mg Oral QHS  . antiseptic oral rinse  15 mL Mouth Rinse BID  . calcitRIOL  0.5 mcg Oral Daily  . carvedilol  3.125 mg Oral BID WC  . donepezil  10 mg Oral QHS  . isosorbide dinitrate  10 mg Oral TID  . levothyroxine  100 mcg Oral QAC breakfast  . mupirocin cream   Topical BID  . omega-3 acid ethyl esters  2 g Oral Daily  . pantoprazole  40 mg Oral Daily  . sodium bicarbonate  650 mg Oral BID  . sodium chloride  3 mL Intravenous Q12H  . sodium chloride  3 mL Intravenous Q12H  . tamsulosin  0.4 mg Oral PC supper  . warfarin  5 mg Oral ONCE-1800  . Warfarin - Pharmacist Dosing Inpatient   Does not apply q1800   Continuous Infusions:  PRN Meds:.sodium chloride, acetaminophen, acetaminophen, nitroGLYCERIN, polyvinyl alcohol, sodium chloride    BP 124/49  Pulse 60  Temp(Src) 97.4 F (36.3 C) (Oral)  Resp 20  Ht 5' 8"  (1.727 m)  Wt 76.068 kg (167 lb 11.2 oz)  BMI 25.50 kg/m2  SpO2 95%   PPS: 30 %   Intake/Output Summary (Last 24 hours) at 08/19/13 1704 Last data filed at 08/19/13 1146  Gross per 24 hour  Intake    243 ml  Output    500 ml  Net   -257 ml    Physical Exam:  General:  Patient seems to have some capacity, but I am concerned that It is not full capacity for decision making.  HEENT:  PERRL, EOMI, anicteric, mmdry  Chest:  Decreased with some basilar crackles CVS: irregular rate and rhythm Abdomen: soft not tender, ileostomy present, right lower quadrant of abdomen Ext: vasculitic appearing rash on lower extremities, an lower abdomen, chronic heal ulcer, left foot. Neuro: oriented to himself and family but may not full understand all the nuances of advanced directives in light of his current issues  Labs: CBC    Component Value Date/Time   WBC 8.6 08/18/2013 0435   RBC 3.67* 08/18/2013 0435   HGB 11.4* 08/18/2013 0435   HCT 33.6* 08/18/2013 0435   PLT 158 08/18/2013 0435   MCV 91.6 08/18/2013 0435   MCH  31.1 08/18/2013 0435   MCHC 33.9 08/18/2013 0435   RDW 14.2 08/18/2013 0435   LYMPHSABS 1.7 08/18/2013 0435   MONOABS 0.9 08/18/2013 0435   EOSABS 0.6 08/18/2013 0435   BASOSABS 0.0 08/18/2013 0435  CMP     Component Value Date/Time   NA 128* 08/19/2013 0350   K 4.4 08/19/2013 0350   CL 92* 08/19/2013 0350   CO2 17* 08/19/2013 0350   GLUCOSE 103* 08/19/2013 0350   BUN 95* 08/19/2013 0350   CREATININE 4.78* 08/19/2013 0350   CALCIUM 10.0 08/19/2013 0350   PROT 7.4 10/11/2010 1229   ALBUMIN 3.9 10/11/2010 1229   AST 26 10/11/2010 1229   ALT 23 10/11/2010 1229   ALKPHOS 40 10/11/2010 1229   BILITOT 0.7 10/11/2010 1229   GFRNONAA 10* 08/19/2013 0350   GFRAA 12* 08/19/2013 0350    Chest Xray Reviewed/Impressions: Streaky left base retrocardiac atelectasis or infiltrate. No  pulmonary edema.   Xray of foot: No radiographic findings to suggest osteomyelitis of the calcaneus.  Suspected Charcot arthropathy involving the midfoot.     Time In Time Out Total Time Spent with Patient Total Overall Time  4 pm  5 pm 50 min  60 min    Greater than 50%  of this time was spent counseling and coordinating care related to the above assessment and plan.   Macdonald Rigor L. Lovena Le, MD MBA The Palliative Medicine Team at [facility] Phone: [phone] Pager: [phone]

## 2013-08-19 NOTE — Progress Notes (Signed)
Physical Therapy Treatment Patient Details Name: [name] MRN: [id number] DOB: January 17, 1925 Today's Date: 08/19/2013 Time: 3419-6222 PT Time Calculation (min): 24 min  PT Assessment / Plan / Recommendation  History of Present Illness The patient is 80 year old male with past medical history of coronary disease (s/p CABG - 1998), ischemic cardiomyopathy (s/p BSX dual chamber ICD implanted 2003 with generator change 2011), atrial fibrillation (on Warfarin and low dose Amiodarone), and chronic renal insufficiency (baseline creat 2. 5 to 3.2), hypertension, hyperlipidemia, hypothyroidism, peripheral vascular disease, history of prostate cancer, who presents with defibrillator firing and fever. At baseline, patient is living with his wife at home. He uses walker to walk around in his house. He was in his usual state of health until two days ago when his ICD fired. Patient reports that his ICD fired twice  two days ago when he was "standing and thinking". He did not have chest pain, shortness of breath or palpitation.  He called his cardiologist who advised him that if it fired again, he should come to the ED. His ICD fired twice last night. Therefore he came to ED for evaluation. Device interrogation in ED reveals appropriate therapy for VT with failed ATP followed by ICD shocks which restored SR.  He has also had 2 episodes of atrial fib with RVR for which he received inappropriate therapy (ATP) per Dr. Tanna Furry note.    PT Comments   Pt admitted with above. Pt currently with functional limitations due to balance and endurance deficits.  Ultimately will benefit from SNF however daughters talking about palliative care and taking pt home.  If takes pt home, will need HHPT, [place] and [facility].  As much support for daughters as able.   Pt will benefit from skilled PT to increase their independence and safety with mobility to allow discharge to the venue listed below.   Follow Up Recommendations   SNF;Supervision/Assistance - 24 hour                 Equipment Recommendations  None recommended by PT        Frequency Min 3X/week   Progress towards PT Goals Progress towards PT goals: Progressing toward goals  Plan Current plan remains appropriate    Precautions / Restrictions Precautions Precautions: Fall;ICD/Pacemaker Restrictions Weight Bearing Restrictions: No   Pertinent Vitals/Pain VSS, no pain    Mobility  Bed Mobility Overal bed mobility: Needs Assistance Bed Mobility: Supine to Sit Supine to sit: Mod assist General bed mobility comments: A to elevate trunk and LE OOB with max cues for safe technique.   Transfers Overall transfer level: Needs assistance Equipment used: Rolling walker (2 wheeled) Transfers: Sit to/from Stand Sit to Stand: Mod assist General transfer comment: Pt leans left during ambulation and requires max verbal and physical cues for technique and keeping RW close to body.   Ambulation/Gait Ambulation/Gait assistance: Mod assist;Min assist Ambulation Distance (Feet): 150 Feet Assistive device: Rolling walker (2 wheeled) Gait Pattern/deviations: Shuffle;Step-to pattern;Trunk flexed Gait velocity: decreased Gait velocity interpretation: <1.8 ft/sec, indicative of risk for recurrent falls General Gait Details: Pt has charcot foot.  Cues needed to stay close to RW.        PT Goals (current goals can now be found in the care plan section)    Visit Information  Last PT Received On: 08/19/13 Assistance Needed: +1 History of Present Illness: The patient is 80 year old male with past medical history of coronary disease (s/p CABG - 1998), ischemic cardiomyopathy (  s/p BSX dual chamber ICD implanted 2003 with generator change 2011), atrial fibrillation (on Warfarin and low dose Amiodarone), and chronic renal insufficiency (baseline creat 2. 5 to 3.2), hypertension, hyperlipidemia, hypothyroidism, peripheral vascular disease, history of prostate  cancer, who presents with defibrillator firing and fever. At baseline, patient is living with his wife at home. He uses walker to walk around in his house. He was in his usual state of health until two days ago when his ICD fired. Patient reports that his ICD fired twice  two days ago when he was "standing and thinking". He did not have chest pain, shortness of breath or palpitation.  He called his cardiologist who advised him that if it fired again, he should come to the ED. His ICD fired twice last night. Therefore he came to ED for evaluation. Device interrogation in ED reveals appropriate therapy for VT with failed ATP followed by ICD shocks which restored SR.  He has also had 2 episodes of atrial fib with RVR for which he received inappropriate therapy (ATP) per Dr. Tanna Furry note.     Subjective Data  Subjective: "I want to get up."   Cognition  Cognition Arousal/Alertness: Awake/alert Behavior During Therapy: WFL for tasks assessed/performed Overall Cognitive Status: Within Functional Limits for tasks assessed    Balance  Balance Overall balance assessment: Needs assistance;History of Falls Sitting-balance support: Bilateral upper extremity supported;Feet supported Sitting balance-Leahy Scale: Good Postural control: Left lateral lean Standing balance support: Bilateral upper extremity supported;During functional activity Standing balance-Leahy Scale: Fair  End of Session PT - End of Session Equipment Utilized During Treatment: Gait belt Activity Tolerance: Patient limited by fatigue Patient left: in chair;with call bell/phone within reach;with family/visitor present Nurse Communication: Mobility status        INGOLD,Dorcas Melito 08/19/2013, 12:33 PM [facility] Acute Rehabilitation [phone] (pager)

## 2013-08-19 NOTE — Progress Notes (Signed)
[name] IV and telemetry has been discontinued, patient and family verbalizes understanding of discharge instructions, patient is being discharged home with family. Ruben Im RN

## 2013-08-19 NOTE — Consult Note (Signed)
Patient [name]      DOB: August 05, 1925      RWP:[id number]  Summary of Goals of Care; full note to follow:  Met with Patient, 2 daughters [name] and Katharine Look, and patient's wife via phone.   Patient awake alert and oriented to his wishes.  He seems to be vacillating in his decisions related to life issues.  He articulated that he wants to have CPR and Life support because he "wants to live".  When we explained that he will one day die because of his kidney's and heart are failing.  The family at this time is more focused on getting him home.  They state that they are leaving today despite anyone else's idea.  They are open to hospice and so I have recommended that they engage hospice now to help their mom and dad work through their goals further  I have provided [employer], MOST forms and a phone number for [facility].  I updated Dr. Dennie Maizes   Neria Procter L. Lovena Le, MD MBA The Palliative Medicine Team at [facility] Phone: [phone] Pager: [phone]

## 2013-08-19 NOTE — Progress Notes (Signed)
Thank you for consulting the Palliative Medicine Team at [facility] to meet your patient's and family's needs.   The reason that you asked [place] to see your patient is  For GOC  We have scheduled your patient for a meeting: Today 4 pm  The Surrogate decision make is: spouse and daughters Contact information: see face sheet  Other family members that need to be present: call wife by phone per daughters    Your patient is able/unable to participate: +/-  Additional Narrative:  Wife hard of hearing but has special phone.  Madeleyn Schwimmer L. Lovena Le, MD MBA The Palliative Medicine Team at [facility] Phone: [phone] Pager: [phone]'

## 2013-08-19 NOTE — Progress Notes (Signed)
Subjective: [name] was less talkative this morning but denies chest pain, SOB, ICD firing.  Had goals of care discussion with patient and his daughter, step-daughter; patient is adamantly opposed to dialysis, wants to be full code at this time but remaining confusion about other topics.  Patient and family willing to speak with palliative care and will bring patient's wife (POA) to meeting.   Objective: Vital signs in last 24 hours: Filed Vitals:   08/18/13 1414 08/18/13 2046 08/18/13 2111 08/19/13 0650  BP: 122/53 112/49  121/48  Pulse: 58 59 60 64  Temp: 97.3 F (36.3 C) 97.3 F (36.3 C)  97.4 F (36.3 C)  TempSrc: Oral Oral  Oral  Resp: 20 20  20   Height:      Weight:    167 lb 11.2 oz (76.068 kg)  SpO2: 99% 100%  95%   Weight change: -3 lb 6 oz (-1.532 kg)  Intake/Output Summary (Last 24 hours) at 08/19/13 [id number] Last data filed at 08/19/13 [id number]  Gross per 24 hour  Intake    720 ml  Output    751 ml  Net    -31 ml   Physical exam General: alert, cooperative, and in NAD HEENT: NCAT Lungs: clear to ascultation bilaterally Heart: regular rate and rhythm, no murmurs, gallops, or rubs Abdomen: soft, ntnd, normal bowel sounds; scarring of abdomen with functioning 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 skin breakdown  Neurologic: alert & oriented X3, cranial nerves II-XII intact, physical deconditioning   Lab Results: Basic Metabolic Panel:  Recent Labs Lab 08/15/13 0405 08/15/13 0425  08/18/13 0435 08/19/13 0350  NA 136*  --   < > 128* 128*  K 3.5*  --   < > 3.8 4.4  CL 96  --   < > 91* 92*  CO2 22  --   < > 17* 17*  GLUCOSE 121*  --   < > 94 103*  BUN 65*  --   < > 84* 95*  CREATININE 4.09*  --   < > 4.64* 4.78*  CALCIUM 9.7  --   < > 9.8 10.0  MG  --  1.7  --   --   --   < > =  values in this interval not displayed.  CBC:   Recent Labs Lab 08/17/13 0348 08/18/13 0435  WBC 12.2* 8.6  NEUTROABS 9.6* 5.4  HGB 11.1* 11.4*  HCT 32.6* 33.6*  MCV 93.4 91.6  PLT 131* 158   Cardiac Enzymes:   Recent Labs Lab 08/16/13 1655 08/17/13 0004 08/17/13 0348  TROPONINI <0.30 <0.30 <0.30   BNP:  Recent Labs Lab 08/15/13 1627  PROBNP 31605.0*   CBG:  Recent Labs Lab 08/15/13 1634 08/15/13 2113  GLUCAP 109* 115*   Thyroid Function Tests:  Recent Labs Lab 08/15/13 1700 08/16/13 1030  TSH 0.295*  --   FREET4  --  1.71   Coagulation:  Recent Labs Lab 08/16/13 0315 08/17/13 0348 08/18/13 0435 08/19/13 0350  LABPROT 27.0* 27.0* 23.5* 20.6*  INR 2.61* 2.61* 2.17* 1.83*   Urinalysis:  Recent Labs Lab 08/15/13 0551  COLORURINE YELLOW  LABSPEC 1.014  PHURINE 5.0  GLUCOSEU NEGATIVE  HGBUR NEGATIVE  BILIRUBINUR NEGATIVE  KETONESUR NEGATIVE  PROTEINUR NEGATIVE  UROBILINOGEN 0.2  NITRITE NEGATIVE  LEUKOCYTESUR NEGATIVE    Medications: I have reviewed the  patient's current medications. Scheduled Meds: . ALPRAZolam  0.5 mg Oral QID  . amiodarone  200 mg Oral BID  . amitriptyline  25 mg Oral QHS  . antiseptic oral rinse  15 mL Mouth Rinse BID  . calcitRIOL  0.5 mcg Oral Daily  . carvedilol  3.125 mg Oral BID WC  . donepezil  10 mg Oral QHS  . isosorbide dinitrate  10 mg Oral TID  . levothyroxine  100 mcg Oral QAC breakfast  . mupirocin cream   Topical BID  . omega-3 acid ethyl esters  2 g Oral Daily  . pantoprazole  40 mg Oral Daily  . sodium bicarbonate  650 mg Oral BID  . sodium chloride  3 mL Intravenous Q12H  . sodium chloride  3 mL Intravenous Q12H  . tamsulosin  0.4 mg Oral PC supper  . Warfarin - Pharmacist Dosing Inpatient   Does not apply q1800   PRN Meds:.sodium chloride, acetaminophen, acetaminophen, nitroGLYCERIN, sodium chloride Assessment/Plan: 80 y/o with extensive PMH presented after defibillator firing x 4, fever  at home (afebrile while inpatient), WBC 24.3 on admission.   #Defebrillator firing, resolved- Defibrillator went off twice on 1/7 for ventricular tachycardia, twice on 1/8 for atrial fibrillation with RVR per device interrogation. ?Etiology, thought most likely due to infection with WBC 24.3 on admission --> 20.3 --> 12.2 --> 8.6 without antibiotics; afebrile, no tachycardia or tachypnea.  Infectious workup negative: CXR, UA and urine cx negative. Left foot xray negative for calcaneus osteomyelitis. Flu negative. Blood cultures x 2 NGTD. Wound care thinks ostomy looks ok.  Troponins x 3 negative. [place]BNP elevated at [address] though not volume overloaded on exam.   -cardiology consult, appreciate recs- Amio gtt x 48 hours through 1/11 then transitioned to amio PO -palliative care consult, appreciate recs; per Dr. Lovena Le, will most likely see patient in AM -continue amiodarone 200 mg PO BID  -continue Coreg 3.125 mg BID -PT recommending SNF but family prefers home health PT, OT recommending HH-OT (arranged with case management)  #Acute on CKD4- Baseline Cr 2.5-3.  Cr on admission 4.09 --> 3.96 --> 4.28 -->4.39 --> 4.64 --> 4.78 (BUN 95) today.  HCO3 stable at 17 (but with acidemic trend).  Cr did not improve with gentle fluids.  Weight stable.  Likely ATN due to decreased cardiac output in light of last week's events.  Left lower extremity ?vasculitis may be related.   -continue holding Lasix -continue home sodium bicarb, calcitriol  -BMP in AM   #CHF, systolic- Echo in 4765 showed EF 20%.  pBNP [id number] though patient clinically euvolemic.  -continue Coreg 3.125 mg BID, continue holding Lasix  -would likely benefit from adding ACEi but will not do this is setting of AKI  #HTN- BP stable -continue Coreg per above  #Atrial fibrillation- on Coumadin INR 1.83 today (subtherapeutic) -Coumadin per pharmacy  -daily INR  #Hypothyroidism- TSH 0.295.  On Synthroid 100 mcg daily at home. T4 wnl at 1.71, T3 34.2  (low) -continue Synthroid 100 mcg daily  -follow-up outpatient PCP Dr. Laqueta Due in [place]: Disposition is deferred at this time, awaiting improvement of current medical problems.  Anticipated discharge tomorrow.   The patient does have a current PCP [name] Kiel, MD) and does need an [facility] hospital follow-up appointment after discharge.   .Services Needed at time of discharge: Y = Yes, Blank = No PT: H/H  OT:   RN:   Equipment:   Other:     LOS:  4 days   Ivin Poot, MD [phone] 08/19/2013, 8:42 AM

## 2013-08-19 NOTE — Progress Notes (Signed)
Occupational Therapy Treatment Patient Details Name: [name] MRN: [id number] DOB: 02-03-1925 Today's Date: 08/19/2013 Time: 7371-0626 OT Time Calculation (min): 29 min  OT Assessment / Plan / Recommendation  History of present illness The patient is 80 year old male with past medical history of coronary disease (s/p CABG - 1998), ischemic cardiomyopathy (s/p BSX dual chamber ICD implanted 2003 with generator change 2011), atrial fibrillation (on Warfarin and low dose Amiodarone), and chronic renal insufficiency (baseline creat 2. 5 to 3.2), hypertension, hyperlipidemia, hypothyroidism, peripheral vascular disease, history of prostate cancer, who presents with defibrillator firing and fever. At baseline, patient is living with his wife at home. He uses walker to walk around in his house. He was in his usual state of health until two days ago when his ICD fired. Patient reports that his ICD fired twice  two days ago when he was "standing and thinking". He did not have chest pain, shortness of breath or palpitation.  He called his cardiologist who advised him that if it fired again, he should come to the ED. His ICD fired twice last night. Therefore he came to ED for evaluation. Device interrogation in ED reveals appropriate therapy for VT with failed ATP followed by ICD shocks which restored SR.  He has also had 2 episodes of atrial fib with RVR for which he received inappropriate therapy (ATP) per Dr. Tanna Furry note.    OT comments  Pt making progress with functional goals. Pt continues to require cues for upright postrue during ambulation with RW for ADL mobilityto bathroom, pt leans to left side and pushes walker too far out in front of him. Pt pleasant and cooperative, family very supportive. Pt to continue with acute OT services to increase level of function and safety  Follow Up Recommendations  Home health OT;Supervision/Assistance - 24 hour    Barriers to Discharge   none    Equipment  Recommendations  None recommended by OT    Recommendations for Other Services    Frequency Min 2X/week   Progress towards OT Goals Progress towards OT goals: Progressing toward goals  Plan Discharge plan remains appropriate    Precautions / Restrictions Precautions Precautions: Fall;ICD/Pacemaker Restrictions Weight Bearing Restrictions: No   Pertinent Vitals/Pain No c/o pain    ADL  Grooming: Performed;Wash/dry hands;Wash/dry face;Minimal assistance Where Assessed - Grooming: Supported standing Upper Body Bathing: Simulated;Set up;Minimal assistance Where Assessed - Upper Body Bathing: Unsupported sitting;Supported sitting Lower Body Bathing: Simulated;Moderate assistance;Maximal assistance Where Assessed - Lower Body Bathing: Supported sitting;Unsupported sitting;Supported standing Toilet Transfer: Simulated;Minimal assistance;Moderate assistance Toilet Transfer Method: Sit to stand Toilet Transfer Equipment: Regular height toilet;Grab bars Toileting - Clothing Manipulation and Hygiene: Maximal assistance;Performed Where Assessed - Toileting Clothing Manipulation and Hygiene: Standing Transfers/Ambulation Related to ADLs: cues for upright postrue during ambulation with RW to bathroom, pt leans to left side and pushes walker too far out in front of him    OT Diagnosis:    OT Problem List:   OT Treatment Interventions:     OT Goals(current goals can now be found in the care plan section) Acute Rehab OT Goals Patient Stated Goal: To get stronger and be able to walk again  Visit Information  Last OT Received On: 08/19/13 Assistance Needed: +1 History of Present Illness: The patient is 80 year old male with past medical history of coronary disease (s/p CABG - 1998), ischemic cardiomyopathy (s/p BSX dual chamber ICD implanted 2003 with generator change 2011), atrial fibrillation (on Warfarin and low dose  Amiodarone), and chronic renal insufficiency (baseline creat 2. 5 to 3.2),  hypertension, hyperlipidemia, hypothyroidism, peripheral vascular disease, history of prostate cancer, who presents with defibrillator firing and fever. At baseline, patient is living with his wife at home. He uses walker to walk around in his house. He was in his usual state of health until two days ago when his ICD fired. Patient reports that his ICD fired twice  two days ago when he was "standing and thinking". He did not have chest pain, shortness of breath or palpitation.  He called his cardiologist who advised him that if it fired again, he should come to the ED. His ICD fired twice last night. Therefore he came to ED for evaluation. Device interrogation in ED reveals appropriate therapy for VT with failed ATP followed by ICD shocks which restored SR.  He has also had 2 episodes of atrial fib with RVR for which he received inappropriate therapy (ATP) per Dr. Tanna Furry note.     Subjective Data      Prior Functioning       Cognition  Cognition Arousal/Alertness: Awake/alert Behavior During Therapy: WFL for tasks assessed/performed Overall Cognitive Status: Within Functional Limits for tasks assessed    Mobility  Bed Mobility Overal bed mobility: Needs Assistance Bed Mobility: Supine to Sit Supine to sit: Min assist;Mod assist General bed mobility comments: A to elevate trunk and LE OOB with max cues for safe technique.   Transfers Overall transfer level: Needs assistance Equipment used: Rolling walker (2 wheeled) Transfers: Sit to/from Stand Sit to Stand: Mod assist General transfer comment: cues for upright postrue during ambulation with RW to bathroom, pt leans to left side and pushes walker too far out in front of him    Exercises      Balance Balance Overall balance assessment: Needs assistance;History of Falls Sitting-balance support: Single extremity supported;No upper extremity supported;Feet supported Sitting balance-Leahy Scale: Good Postural control: Left lateral  lean Standing balance support: Bilateral upper extremity supported;During functional activity Standing balance-Leahy Scale: Fair  End of Session OT - End of Session Equipment Utilized During Treatment: Gait belt;Rolling walker Activity Tolerance: Patient tolerated treatment well Patient left: with call bell/phone within reach;with family/visitor present;in bed  GO     Britt Bottom 08/19/2013, 2:42 PM

## 2013-08-19 NOTE — Discharge Instructions (Signed)
Please take all of your medicines as prescribed and attend all of your follow-up appointments.    Kidney Failure Kidney failure happens when the kidneys cannot remove waste and excess fluid that naturally builds up in your blood after your body breaks down food. This leads to a dangerous buildup of waste products and fluid in the blood. HOME CARE  Follow your diet as told by your doctor.  Take all medicines as told by your doctor.  Keep all of your dialysis appointments. Call if you are unable to keep an appointment. GET HELP RIGHT AWAY IF:   You make a lot more or very little pee (urine).  Your face or ankles puff up (swell).  You develop shortness of breath.  You develop weakness, feel tired, or you do not feel hungry (appetite loss).  You feel poorly for no known reason. MAKE SURE YOU:   Understand these instructions.  Will watch your condition.  Will get help right away if you are not doing well or get worse. Document Released: 10/18/2009 Document Revised: 10/16/2011 Document Reviewed: 11/24/2009 [facility] Patient Information 2014 [place], [place].

## 2013-08-19 NOTE — Progress Notes (Signed)
  Date: 08/19/2013  Patient name: [name]  Medical record number: [id number]  Date of birth: 11-Feb-1925   This patient has been seen and the plan of care was discussed with the house staff. Please see their note for complete details. I concur with their findings with the following additions/corrections: Discussed current situation with family at bedside along with team. He continues to have worsening renal function and is developing worsening anion gap met acidosis. Cardiac event may have caused transient hypotension and resulting ATN. 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 In this case, if this is related to declining renal function, we will need to evaluate him with skin bx, complement levels, UA, and possibly start steroid tx. He is not current interested in HD. His prognosis at this time given his comorbidities is poor. Family is interested in discussing with palliative care. His wife will need to be involved. Unfortunately, given his decreased understanding and interaction, I am concerned he may be developing Uremia.  Dominic Pea, DO, [facility] 08/19/2013, 11:38 AM

## 2013-08-19 NOTE — Progress Notes (Signed)
Spoke with wife she wants husband to come home so he can sleep better and his wife hasn't been able to sleep as well.  Wife is hard of hearing.  Grandaughter on the phone as well and states her grandfather wants to go home and they know in the near future what may happen.  Grandfather wishes to go home it is nothing we can do about kidneys and heart.  Grandfather does not wish to do things to prolong things   Aundra Dubin MD

## 2013-08-19 NOTE — Progress Notes (Signed)
ANTICOAGULATION CONSULT NOTE - Follow Up Consult  Pharmacy Consult:  Coumadin Indication: atrial fibrillation  Allergies  Allergen Reactions  . Ciprofloxacin Hcl     Rash, itching  . Amoxicillin     REACTION: rash  . Clindamycin   . Penicillins     REACTION: rash    Patient Measurements: Height: 5\' 8"  (172.7 cm) Weight: 167 lb 11.2 oz (76.068 kg) IBW/kg (Calculated) : 68.4  Vital Signs: Temp: 97.4 F (36.3 C) (01/13 0650) Temp src: Oral (01/13 0650) BP: 121/48 mmHg (01/13 0650) Pulse Rate: 64 (01/13 0650)  Labs:  Recent Labs  08/16/13 1655 08/17/13 0004 08/17/13 0348 08/18/13 0435 08/19/13 0350  HGB  --   --  11.1* 11.4*  --   HCT  --   --  32.6* 33.6*  --   PLT  --   --  131* 158  --   LABPROT  --   --  27.0* 23.5* 20.6*  INR  --   --  2.61* 2.17* 1.83*  CREATININE  --   --  4.39* 4.64* 4.78*  TROPONINI <0.30 <0.30 <0.30  --   --     Estimated Creatinine Clearance: 10.7 ml/min (by C-G formula based on Cr of 4.78).     Assessment: 74 yoM who presents with report of ICD firing.  He continues on Coumadin for history of Afib.  INR sub-therapeutic; no bleeding reported.   Goal of Therapy:  INR 2-3 Monitor platelets by anticoagulation protocol: Yes    Plan:  - Coumadin 5mg  PO today - Daily PT / INR    Levie Wages D. Mina Marble, PharmD, BCPS Pager:  [phone] 08/19/2013, 8:50 AM

## 2013-08-20 ENCOUNTER — Telehealth: Payer: Self-pay | Admitting: Internal Medicine

## 2013-08-20 NOTE — Telephone Encounter (Signed)
Called Dr. Felipa Emory office, patient will be seen tomorrow morning 1/15 at 11a for INR check.  Scheduler to call and inform patient of appt time.

## 2013-08-21 LAB — CULTURE, BLOOD (ROUTINE X 2)
Culture: NO GROWTH
Culture: NO GROWTH

## 2013-08-22 NOTE — Discharge Summary (Signed)
  Date: 08/22/2013  Patient name: [name]  Medical record number: [id number]  Date of birth: [birth year]   This patient has been seen and the plan of care was discussed with the house staff. Please see their note for complete details. I concur with their findings and plan.  Dominic Pea, DO, [facility] 08/22/2013, 11:39 AM

## 2013-09-02 ENCOUNTER — Encounter: Payer: Medicare PPO | Admitting: Internal Medicine

## 2013-09-02 ENCOUNTER — Encounter: Payer: Medicare Other | Admitting: Nurse Practitioner

## 2013-09-10 ENCOUNTER — Encounter: Payer: Self-pay | Admitting: *Deleted

## 2013-09-10 ENCOUNTER — Encounter: Payer: Self-pay | Admitting: Internal Medicine

## 2013-09-15 ENCOUNTER — Telehealth: Payer: Self-pay | Admitting: Internal Medicine

## 2013-09-15 NOTE — Telephone Encounter (Signed)
Pt's spouse states device was checked while pt in ER on 08/15/13. No programming changes were made.   ROV w/ Dr. Lovena Le 11/18/13.

## 2013-09-15 NOTE — Telephone Encounter (Signed)
New Message  Pt wife called. She states that they have received the letters indicating the pt will need a follow up Defib Check! She states that the pt was recently in the hospital, is now doing physical therapy at home. Wife states that the patient is fine, they are unable to come in to the office at this time. Pt wife is requesting a call back from the nurse to discuss more.

## 2013-11-07 ENCOUNTER — Other Ambulatory Visit: Payer: Self-pay

## 2013-11-07 MED ORDER — CARVEDILOL 3.125 MG PO TABS: ORAL_TABLET | ORAL | Status: AC

## 2013-11-18 ENCOUNTER — Encounter: Payer: Self-pay | Admitting: Internal Medicine

## 2013-11-18 ENCOUNTER — Ambulatory Visit (INDEPENDENT_AMBULATORY_CARE_PROVIDER_SITE_OTHER): Payer: Medicare Other | Admitting: Internal Medicine

## 2013-11-18 VITALS — BP 171/71 | HR 62 | Ht 68.0 in | Wt 174.2 lb

## 2013-11-18 DIAGNOSIS — R001 Bradycardia, unspecified: Secondary | ICD-10-CM

## 2013-11-18 DIAGNOSIS — I2510 Atherosclerotic heart disease of native coronary artery without angina pectoris: Secondary | ICD-10-CM

## 2013-11-18 DIAGNOSIS — I4891 Unspecified atrial fibrillation: Secondary | ICD-10-CM

## 2013-11-18 DIAGNOSIS — I4729 Other ventricular tachycardia: Secondary | ICD-10-CM

## 2013-11-18 DIAGNOSIS — I472 Ventricular tachycardia, unspecified: Secondary | ICD-10-CM

## 2013-11-18 DIAGNOSIS — I498 Other specified cardiac arrhythmias: Secondary | ICD-10-CM

## 2013-11-18 DIAGNOSIS — I2589 Other forms of chronic ischemic heart disease: Secondary | ICD-10-CM

## 2013-11-18 DIAGNOSIS — Z95810 Presence of automatic (implantable) cardiac defibrillator: Secondary | ICD-10-CM

## 2013-11-18 DIAGNOSIS — I5022 Chronic systolic (congestive) heart failure: Secondary | ICD-10-CM

## 2013-11-18 LAB — MDC_IDC_ENUM_SESS_TYPE_INCLINIC
Date Time Interrogation Session: 20150414040000
HighPow Impedance: 47 Ohm
HighPow Impedance: 60 Ohm
Lead Channel Impedance Value: 533 Ohm
Lead Channel Pacing Threshold Amplitude: 1 V
Lead Channel Pacing Threshold Amplitude: 1.3 V
Lead Channel Pacing Threshold Pulse Width: 0.7 ms
Lead Channel Sensing Intrinsic Amplitude: 25 mV
Lead Channel Setting Pacing Amplitude: 2.4 V
MDC IDC MSMT LEADCHNL RA IMPEDANCE VALUE: 615 Ohm
MDC IDC MSMT LEADCHNL RA PACING THRESHOLD PULSEWIDTH: 0.4 ms
MDC IDC MSMT LEADCHNL RA SENSING INTR AMPL: 1.3 mV
MDC IDC PG SERIAL: 149416
MDC IDC SET LEADCHNL RA PACING AMPLITUDE: 2 V
MDC IDC SET LEADCHNL RV PACING PULSEWIDTH: 0.8 ms
MDC IDC SET LEADCHNL RV SENSING SENSITIVITY: 0.5 mV
MDC IDC STAT BRADY RA PERCENT PACED: 77 %
MDC IDC STAT BRADY RV PERCENT PACED: 19 %
Zone Setting Detection Interval: 300 ms
Zone Setting Detection Interval: 375 ms

## 2013-11-18 MED ORDER — AMIODARONE HCL 200 MG PO TABS: 200.0000 mg | ORAL_TABLET | Freq: Every day | ORAL | Status: AC

## 2013-11-18 NOTE — Patient Instructions (Addendum)
Your physician wants you to follow-up in: 12 months with Dr Knox Saliva will receive a reminder letter in the mail two months in advance. If you don't receive a letter, please call our office to schedule the follow-up appointment.  Remote monitoring is used to monitor your Pacemaker or ICD from home. This monitoring reduces the number of office visits required to check your device to one time per year. It allows [place] to keep an eye on the functioning of your device to ensure it is working properly. You are scheduled for a device check from home on 02/19/14. You may send your transmission at any time that day. If you have a wireless device, the transmission will be sent automatically. After your physician reviews your transmission, you will receive a postcard with your next transmission date.    Your physician has recommended you make the following change in your medication:  1) Decrease Amiodarone to 200mg  daily

## 2013-11-18 NOTE — Assessment & Plan Note (Signed)
His [employer] ICD is working normally. Will recheck in several months. 

## 2013-11-18 NOTE — Assessment & Plan Note (Signed)
I have asked the patient to reduce his dose of amiodarone to 200 mg daily. Will recheck in several months

## 2013-11-18 NOTE — Progress Notes (Signed)
HPI [name] returns today for followup. He is a very pleasant 80 year old man with an ischemic cardiomyopathy, chronic systolic heart failure, status post ICD implantation. In the interim, he denies chest pain, shortness of breath, or peripheral edema. No syncope. With his advanced age, he is not quite as active as he used to be. He was in the hospital several months ago, presenting with VT. He desired to remain full code and refused hospice or palliative care. He denies recurrent ICD shock. He has chronic class 3 CHF symptoms. He has had problems with constipation. Minimal peripheral edema Allergies  Allergen Reactions  . Ciprofloxacin Hcl     Rash, itching  . Amoxicillin     REACTION: rash  . Clindamycin   . Penicillins     REACTION: rash     Current Outpatient Prescriptions  Medication Sig Dispense Refill  . ALPRAZolam (XANAX) 0.5 MG tablet 1 tablet in the a.m., 0.5 mg noon, and 0.5 mg at 3pm and 1 tablet at bedtime      . amiodarone (PACERONE) 200 MG tablet Take 1 tablet (200 mg total) by mouth 2 (two) times daily.  60 tablet  0  . amitriptyline (ELAVIL) 25 MG tablet Take 25 mg by mouth at bedtime.      . calcitRIOL (ROCALTROL) 0.5 MCG capsule Take one tablet by mouth every other day Per [name]. Jimmy [name]      . carvedilol (COREG) 3.125 MG tablet TAKE ONE TABLET BY MOUTH TWICE A DAY WITH MEALS  60 tablet  3  . donepezil (ARICEPT) 10 MG tablet Take 10 mg by mouth at bedtime.       . fish oil-omega-3 fatty acids 1000 MG capsule Take 2 g by mouth daily.        . isosorbide dinitrate (ISORDIL) 10 MG tablet TAKE ONE TABLET BY MOUTH THREE TIMES DAILY      . levothyroxine (SYNTHROID, LEVOTHROID) 100 MCG tablet Take one (1) tablet(s) once daily  90 tablet  3  . Multiple Vitamins-Minerals (MULTIVITAMIN & MINERAL PO) Take 1 tablet by mouth daily.        . nitroGLYCERIN (NITROSTAT) 0.4 MG SL tablet Place 1 tablet (0.4 mg total) under the tongue every 5 (five) minutes as needed.  30 tablet  11  .  omeprazole (PRILOSEC) 40 MG capsule Take 40 mg by mouth daily.      . sodium bicarbonate 648 MG tablet Take 2  tablets by mouth two times daily      . Tamsulosin HCl (FLOMAX) 0.4 MG CAPS Take 0.4 mg by mouth daily after supper. As directed by [name]. Rosana [name]       . warfarin (COUMADIN) 5 MG tablet take as directed by coumadin clinic  90 tablet  1   No current facility-administered medications for this visit.     Past Medical History  Diagnosis Date  . Hypertension   . CAD (coronary artery disease)     remote CABG  . Ischemic cardiomyopathy   . Chronic systolic heart failure   . Atrial fibrillation   . Atrial flutter   . Peripheral vascular disease   . Hypercholesteremia   . Hypothyroid   . Colitis, ischemic     has ileostomy in place x 14 years  . BPH with urinary obstruction   . DJD (degenerative joint disease)   . Anxiety   . Anemia   . PVD (peripheral vascular disease)   . 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 High  risk medication use     on amiodarone therapy  . Automatic implantable cardioverter-defibrillator in situ   . CHF (congestive heart failure)   . Myocardial infarction 1998  . GERD (gastroesophageal reflux disease)   . Chronic kidney disease (CKD), stage IV (severe)   . Prostate cancer 2003    tx'd w/radiation"  . Skin cancer     "basal and squamous; arms, face" (08/15/2013)    ROS:   All systems reviewed and negative except as noted in the HPI.   Past Surgical History  Procedure Laterality Date  . Total colectomy  1998    ischemic colitis  . Doppler echocardiography  2006, 2007  . Cardiac defibrillator placement  2003; 2011    BSX dual chamber ICD implanted by [name] 2003 with gen change 2011  . Coronary artery bypass graft  1998    CABG X8  . Colon surgery    . Permanent ileostomy  1998  . Incision and drainage  1998-1999    "had staph infection in stomach incision; had to have several ORs during his 3+ month hospitalization"  .  Cataract extraction w/ intraocular lens  implant, bilateral Bilateral 2000's  . Skin cancer excision Right     arm  . Cardiac catheterization  1998; 10/26/1999     Family History  Problem Relation Age of Onset  . Colon cancer Brother   . Esophageal cancer Brother   . Diabetes Mother   . Heart disease Father      History   Social History  . Marital Status: Married    Spouse Name: [name]    Number of Children: N/A  . Years of Education: N/A   Occupational History  . retired    Social History Main Topics  . Smoking status: Former Smoker -- 1.00 packs/day for 10 years    Types: Cigarettes  . Smokeless tobacco: Never Used     Comment: 08/15/2013 "quit smoking cigarettes in 1953"  . Alcohol Use: Yes     Comment: 08/15/2013 "used to drink some; moderate drinker; nothing since 1998"  . Drug Use: No  . Sexual Activity: No   Other Topics Concern  . Not on file   Social History Narrative  . No narrative on file     There were no vitals taken for this visit.  Physical Exam:  Stable appearing elderly man, NAD HEENT: Unremarkable Neck:  7 cm JVD, no thyromegally Lungs:  Clear except for rare basilar rales. No wheezes or rhonchi. HEART:  Regular rate rhythm, no murmurs, no rubs, no clicks Abd:  soft, positive bowel sounds, no organomegally, no rebound, no guarding Ext:  2 plus pulses, no edema, no cyanosis, no clubbing Skin:  No rashes no nodules Neuro:  CN II through XII intact, motor grossly intact  DEVICE  Normal device function.  See PaceArt for details.   Assess/Plan:

## 2013-11-18 NOTE — Assessment & Plan Note (Signed)
His heart failure symptoms are class 3. He will continue his current medical therapy.

## 2014-02-03 ENCOUNTER — Encounter: Payer: Self-pay | Admitting: Internal Medicine

## 2014-02-19 ENCOUNTER — Ambulatory Visit (INDEPENDENT_AMBULATORY_CARE_PROVIDER_SITE_OTHER): Payer: Medicare Other | Admitting: *Deleted

## 2014-02-19 ENCOUNTER — Encounter: Payer: Self-pay | Admitting: Internal Medicine

## 2014-02-19 DIAGNOSIS — I472 Ventricular tachycardia: Secondary | ICD-10-CM

## 2014-02-19 DIAGNOSIS — I4729 Other ventricular tachycardia: Secondary | ICD-10-CM

## 2014-02-19 LAB — MDC_IDC_ENUM_SESS_TYPE_REMOTE
Brady Statistic RV Percent Paced: 43 %
HighPow Impedance: 54 Ohm
Implantable Pulse Generator Serial Number: 149416
Lead Channel Impedance Value: 500 Ohm
Lead Channel Sensing Intrinsic Amplitude: 15.8 mV
Lead Channel Sensing Intrinsic Amplitude: 2.2 mV
Lead Channel Setting Pacing Amplitude: 2 V
Lead Channel Setting Pacing Amplitude: 2.4 V
Lead Channel Setting Sensing Sensitivity: 0.5 mV
MDC IDC MSMT LEADCHNL RV IMPEDANCE VALUE: 476 Ohm
MDC IDC SET LEADCHNL RV PACING PULSEWIDTH: 0.8 ms
MDC IDC SET ZONE DETECTION INTERVAL: 375 ms
MDC IDC STAT BRADY RA PERCENT PACED: 39 %
Zone Setting Detection Interval: 300 ms

## 2014-02-19 NOTE — Progress Notes (Signed)
Remote ICD transmission.   

## 2014-03-07 DEATH — deceased

## 2014-03-13 ENCOUNTER — Encounter: Payer: Self-pay | Admitting: Cardiology

## 2014-12-04 IMAGING — CR DG CHEST 2V
2 series · 2 of 2 positions shown · non-contrast
Comparison: 08/15/2013

CLINICAL DATA: Fever

EXAM:
CHEST  2 VIEW

[x chest ap]
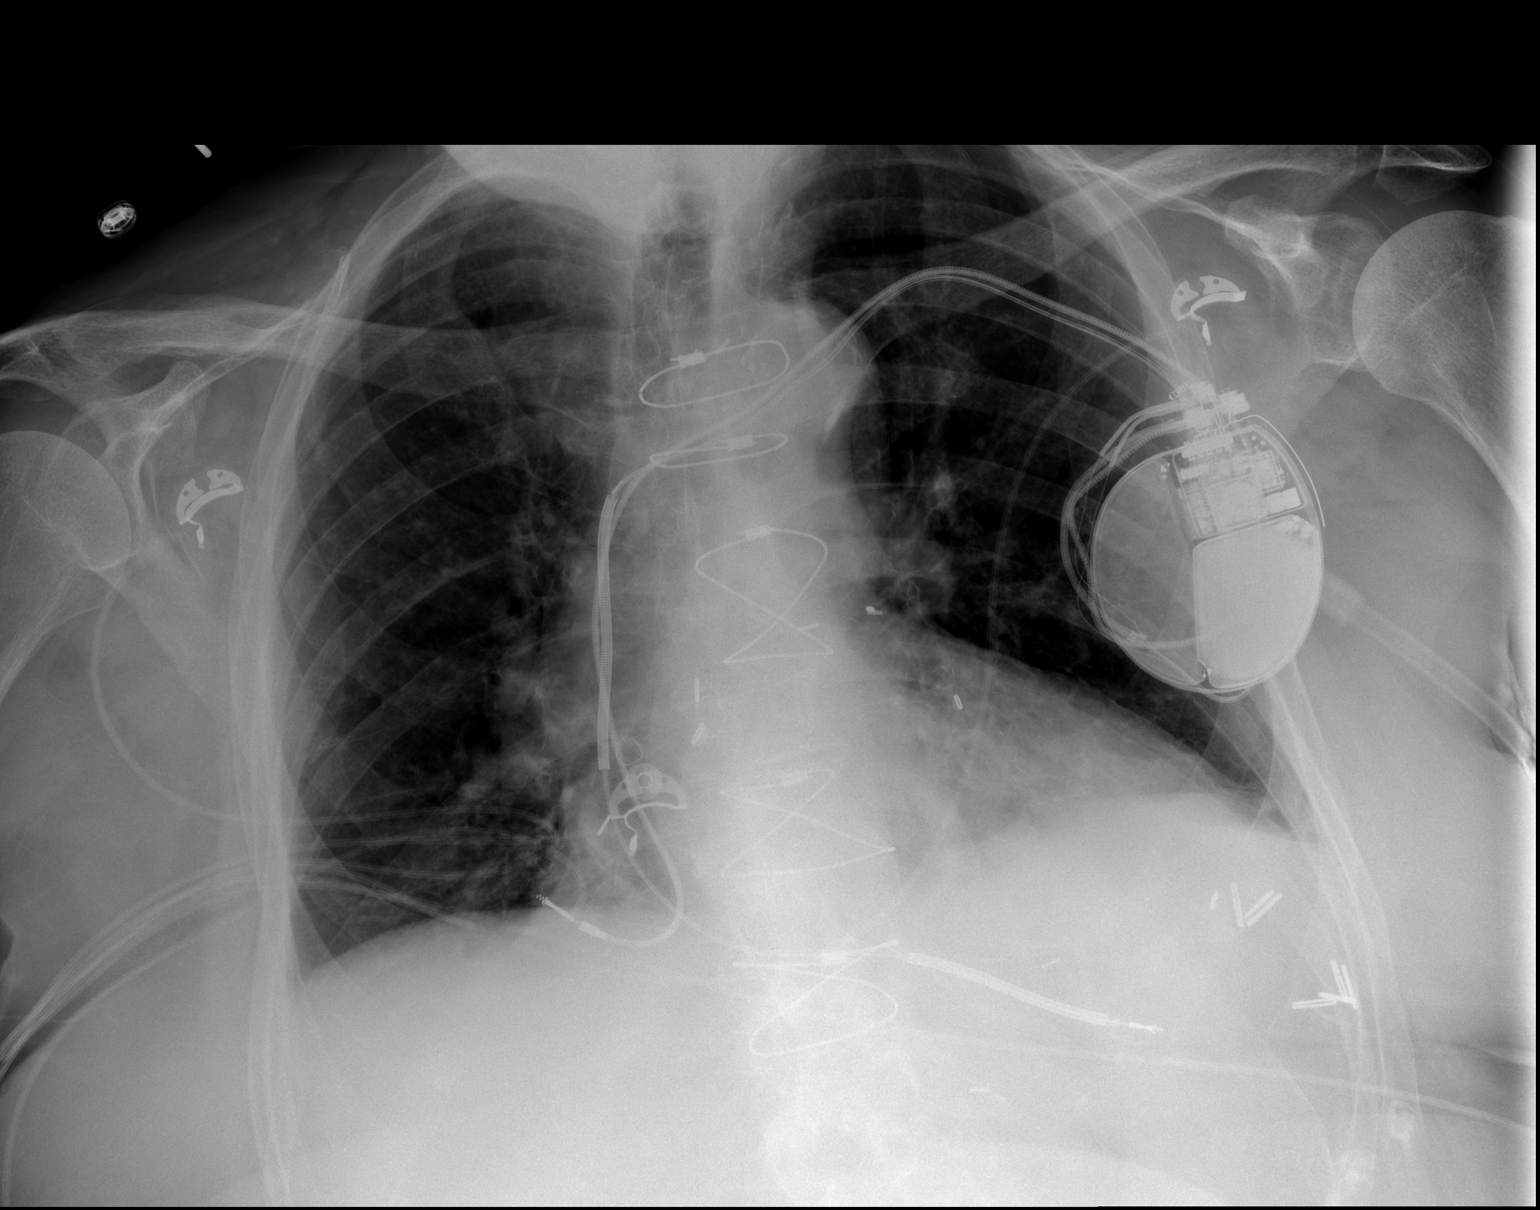

[w chest lat]
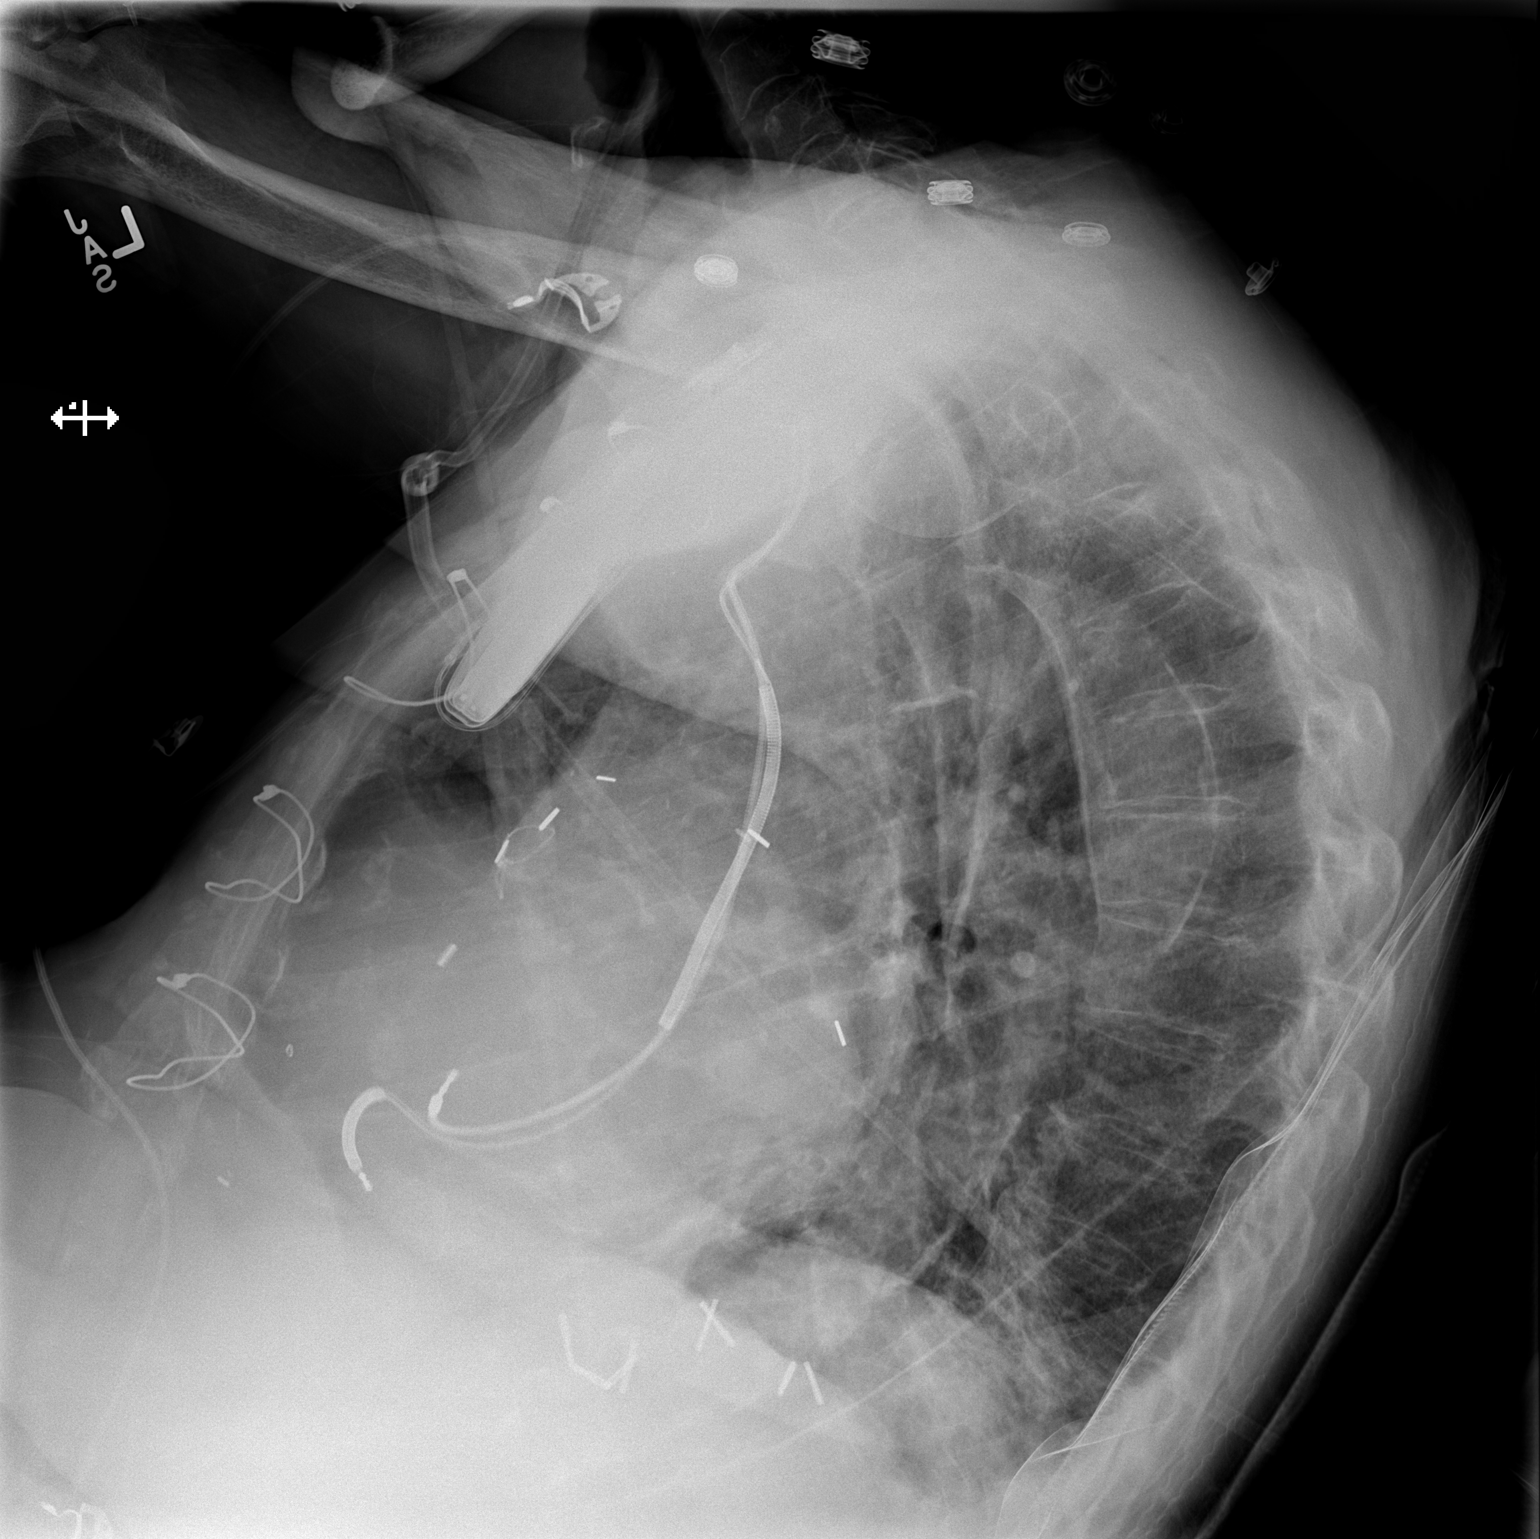

[2 of 2 positions shown; findings below may reference images not displayed]

FINDINGS: Cardiomediastinal silhouette is stable. Dual lead cardiac pacemaker
is unchanged is position. Osteopenia and mild degenerative changes
thoracic spine. Atherosclerotic calcifications of thoracic aorta.
Status post median sternotomy. There is streaky left base
retrocardiac atelectasis or infiltrate. No pulmonary edema.
IMPRESSION: Streaky left base retrocardiac atelectasis or infiltrate. No
pulmonary edema.
